# Patient Record
Sex: Male | Born: 1960 | Race: White | Hispanic: No | Marital: Single | Smoking: Never smoker
Health system: Southern US, Community
[De-identification: ages and names within clinical notes are randomized; demographics above are authoritative.]

## PROBLEM LIST (undated history)

## (undated) DIAGNOSIS — F3130 Bipolar disorder, current episode depressed, mild or moderate severity, unspecified: Secondary | ICD-10-CM

## (undated) DIAGNOSIS — R251 Tremor, unspecified: Secondary | ICD-10-CM

## (undated) DIAGNOSIS — F329 Major depressive disorder, single episode, unspecified: Secondary | ICD-10-CM

## (undated) DIAGNOSIS — B20 Human immunodeficiency virus [HIV] disease: Secondary | ICD-10-CM

## (undated) DIAGNOSIS — G251 Drug-induced tremor: Secondary | ICD-10-CM

## (undated) DIAGNOSIS — N50819 Testicular pain, unspecified: Secondary | ICD-10-CM

## (undated) DIAGNOSIS — M1990 Unspecified osteoarthritis, unspecified site: Secondary | ICD-10-CM

## (undated) DIAGNOSIS — I491 Atrial premature depolarization: Secondary | ICD-10-CM

## (undated) DIAGNOSIS — I10 Essential (primary) hypertension: Secondary | ICD-10-CM

## (undated) DIAGNOSIS — D1800 Hemangioma unspecified site: Secondary | ICD-10-CM

## (undated) DIAGNOSIS — F32A Depression, unspecified: Secondary | ICD-10-CM

## (undated) DIAGNOSIS — F419 Anxiety disorder, unspecified: Secondary | ICD-10-CM

## (undated) DIAGNOSIS — R42 Dizziness and giddiness: Secondary | ICD-10-CM

## (undated) HISTORY — DX: Dizziness and giddiness: R42

## (undated) HISTORY — DX: Essential (primary) hypertension: I10

## (undated) HISTORY — DX: Human immunodeficiency virus (HIV) disease: B20

## (undated) HISTORY — DX: Unspecified osteoarthritis, unspecified site: M19.90

## (undated) HISTORY — DX: Testicular pain, unspecified: N50.819

## (undated) HISTORY — DX: Drug-induced tremor: G25.1

## (undated) HISTORY — DX: Anxiety disorder, unspecified: F41.9

## (undated) HISTORY — DX: Atrial premature depolarization: I49.1

## (undated) HISTORY — DX: Major depressive disorder, single episode, unspecified: F32.9

## (undated) HISTORY — DX: Hemangioma unspecified site: D18.00

## (undated) HISTORY — DX: Depression, unspecified: F32.A

## (undated) HISTORY — DX: Tremor, unspecified: R25.1

## (undated) HISTORY — DX: Bipolar disorder, current episode depressed, mild or moderate severity, unspecified: F31.30

---

## 2004-05-14 ENCOUNTER — Encounter (INDEPENDENT_AMBULATORY_CARE_PROVIDER_SITE_OTHER): Payer: Self-pay | Admitting: *Deleted

## 2004-05-14 LAB — CONVERTED CEMR LAB
CD4 Count: 295 microliters
CD4 T Cell Abs: 295

## 2004-08-13 ENCOUNTER — Encounter (INDEPENDENT_AMBULATORY_CARE_PROVIDER_SITE_OTHER): Payer: Self-pay | Admitting: *Deleted

## 2004-08-13 LAB — CONVERTED CEMR LAB: HIV 1 RNA Quant: 714 copies/mL

## 2005-09-13 ENCOUNTER — Encounter (INDEPENDENT_AMBULATORY_CARE_PROVIDER_SITE_OTHER): Payer: Self-pay | Admitting: *Deleted

## 2005-09-13 LAB — CONVERTED CEMR LAB: CD4 Count: 484 microliters

## 2005-11-29 ENCOUNTER — Ambulatory Visit: Payer: Self-pay | Admitting: Infectious Diseases

## 2005-12-14 DIAGNOSIS — B20 Human immunodeficiency virus [HIV] disease: Secondary | ICD-10-CM

## 2006-04-09 ENCOUNTER — Encounter (INDEPENDENT_AMBULATORY_CARE_PROVIDER_SITE_OTHER): Payer: Self-pay | Admitting: *Deleted

## 2006-04-22 ENCOUNTER — Encounter (INDEPENDENT_AMBULATORY_CARE_PROVIDER_SITE_OTHER): Payer: Self-pay | Admitting: *Deleted

## 2006-04-24 ENCOUNTER — Ambulatory Visit: Payer: Self-pay | Admitting: Infectious Diseases

## 2006-04-24 ENCOUNTER — Encounter: Admission: RE | Admit: 2006-04-24 | Discharge: 2006-04-24 | Payer: Self-pay | Admitting: Internal Medicine

## 2006-04-24 LAB — CONVERTED CEMR LAB
AST: 19 units/L (ref 0–37)
Alkaline Phosphatase: 100 units/L (ref 39–117)
BUN: 17 mg/dL (ref 6–23)
Basophils Relative: 0 % (ref 0–1)
Calcium: 9.4 mg/dL (ref 8.4–10.5)
Chloride: 103 meq/L (ref 96–112)
Creatinine, Ser: 1.1 mg/dL (ref 0.40–1.50)
Eosinophils Absolute: 0.1 10*3/uL (ref 0.0–0.7)
Glucose, Bld: 112 mg/dL — ABNORMAL HIGH (ref 70–99)
HCV Ab: NEGATIVE
HDL: 52 mg/dL (ref >39)
HIV 1 RNA Quant: 113 copies/mL — ABNORMAL HIGH (ref <50)
HIV-1 antibody: POSITIVE — AB
HIV-2 Ab: NEGATIVE
HIV: REACTIVE
Hemoglobin, Urine: NEGATIVE
Hemoglobin: 16.9 g/dL (ref 13.0–17.0)
Hepatitis B Surface Ag: NEGATIVE
Ketones, ur: NEGATIVE mg/dL
Leukocytes, UA: NEGATIVE
Lymphs Abs: 2.4 10*3/uL (ref 0.7–3.3)
MCHC: 35.4 g/dL (ref 30.0–36.0)
MCV: 90.5 fL (ref 78.0–100.0)
Monocytes Absolute: 0.4 10*3/uL (ref 0.2–0.7)
Monocytes Relative: 7 % (ref 3–11)
Nitrite: NEGATIVE
Protein, ur: NEGATIVE mg/dL
RBC: 5.27 M/uL (ref 4.22–5.81)
Total CHOL/HDL Ratio: 3.8
Triglycerides: 82 mg/dL (ref <150)
Urobilinogen, UA: 0.2 (ref 0.0–1.0)
pH: 5.5 (ref 5.0–8.0)

## 2006-04-30 ENCOUNTER — Encounter (INDEPENDENT_AMBULATORY_CARE_PROVIDER_SITE_OTHER): Payer: Self-pay | Admitting: Infectious Diseases

## 2006-05-07 ENCOUNTER — Ambulatory Visit: Payer: Self-pay | Admitting: Infectious Diseases

## 2006-06-11 ENCOUNTER — Ambulatory Visit: Payer: Self-pay | Admitting: Infectious Diseases

## 2006-09-17 ENCOUNTER — Encounter: Admission: RE | Admit: 2006-09-17 | Discharge: 2006-09-17 | Payer: Self-pay | Admitting: Infectious Disease

## 2006-09-17 ENCOUNTER — Ambulatory Visit: Payer: Self-pay | Admitting: Infectious Disease

## 2006-09-17 LAB — CONVERTED CEMR LAB
ALT: 17 units/L (ref 0–53)
AST: 16 units/L (ref 0–37)
Basophils Absolute: 0 10*3/uL (ref 0.0–0.1)
Basophils Relative: 0 % (ref 0–1)
CO2: 24 meq/L (ref 19–32)
Calcium: 8.8 mg/dL (ref 8.4–10.5)
Chloride: 106 meq/L (ref 96–112)
Creatinine, Ser: 1.07 mg/dL (ref 0.40–1.50)
HIV 1 RNA Quant: 73 copies/mL — ABNORMAL HIGH (ref <50)
Lymphocytes Relative: 43 % (ref 12–46)
MCHC: 33.9 g/dL (ref 30.0–36.0)
Monocytes Absolute: 0.5 10*3/uL (ref 0.2–0.7)
Neutro Abs: 2.6 10*3/uL (ref 1.7–7.7)
Neutrophils Relative %: 45 % (ref 43–77)
Platelets: 161 10*3/uL (ref 150–400)
RDW: 14.4 % — ABNORMAL HIGH (ref 11.5–14.0)
Sodium: 142 meq/L (ref 135–145)
Total Protein: 7.1 g/dL (ref 6.0–8.3)

## 2006-09-25 ENCOUNTER — Encounter (INDEPENDENT_AMBULATORY_CARE_PROVIDER_SITE_OTHER): Payer: Self-pay | Admitting: *Deleted

## 2006-10-01 ENCOUNTER — Ambulatory Visit (HOSPITAL_COMMUNITY): Admission: RE | Admit: 2006-10-01 | Discharge: 2006-10-01 | Payer: Self-pay | Admitting: Infectious Disease

## 2006-10-01 ENCOUNTER — Ambulatory Visit: Payer: Self-pay | Admitting: Infectious Disease

## 2006-10-01 DIAGNOSIS — M21619 Bunion of unspecified foot: Secondary | ICD-10-CM

## 2006-10-01 DIAGNOSIS — E785 Hyperlipidemia, unspecified: Secondary | ICD-10-CM | POA: Insufficient documentation

## 2006-10-01 DIAGNOSIS — F329 Major depressive disorder, single episode, unspecified: Secondary | ICD-10-CM | POA: Insufficient documentation

## 2006-11-20 ENCOUNTER — Ambulatory Visit: Payer: Self-pay | Admitting: Infectious Disease

## 2006-11-20 ENCOUNTER — Encounter: Admission: RE | Admit: 2006-11-20 | Discharge: 2006-11-20 | Payer: Self-pay | Admitting: Infectious Disease

## 2006-11-20 LAB — CONVERTED CEMR LAB
Albumin: 4.6 g/dL (ref 3.5–5.2)
Alkaline Phosphatase: 96 units/L (ref 39–117)
BUN: 16 mg/dL (ref 6–23)
Calcium: 9.2 mg/dL (ref 8.4–10.5)
Chloride: 106 meq/L (ref 96–112)
Creatinine, Ser: 1.07 mg/dL (ref 0.40–1.50)
Glucose, Bld: 118 mg/dL — ABNORMAL HIGH (ref 70–99)
HDL: 52 mg/dL (ref >39)
Potassium: 4.2 meq/L (ref 3.5–5.3)
Total CHOL/HDL Ratio: 3.2
Triglycerides: 57 mg/dL (ref <150)

## 2006-12-03 ENCOUNTER — Ambulatory Visit: Payer: Self-pay | Admitting: Infectious Disease

## 2006-12-03 LAB — CONVERTED CEMR LAB
Cholesterol: 180 mg/dL (ref 0–200)
LDL Cholesterol: 117 mg/dL — ABNORMAL HIGH (ref 0–99)
Total CHOL/HDL Ratio: 3.8
Triglycerides: 81 mg/dL (ref <150)
VLDL: 16 mg/dL (ref 0–40)

## 2006-12-12 ENCOUNTER — Telehealth: Payer: Self-pay | Admitting: Infectious Disease

## 2006-12-12 ENCOUNTER — Telehealth: Payer: Self-pay

## 2007-02-11 ENCOUNTER — Encounter: Payer: Self-pay | Admitting: Infectious Disease

## 2007-12-06 ENCOUNTER — Ambulatory Visit: Payer: Self-pay | Admitting: Internal Medicine

## 2007-12-06 ENCOUNTER — Ambulatory Visit: Payer: Self-pay | Admitting: Infectious Disease

## 2007-12-06 LAB — CONVERTED CEMR LAB
ALT: 41 units/L (ref 0–53)
AST: 26 units/L (ref 0–37)
Basophils Absolute: 0 10*3/uL (ref 0.0–0.1)
Basophils Relative: 0 % (ref 0–1)
Calcium: 9.1 mg/dL (ref 8.4–10.5)
Chloride: 104 meq/L (ref 96–112)
Creatinine, Ser: 0.96 mg/dL (ref 0.40–1.50)
HIV 1 RNA Quant: <50 copies/mL (ref <50)
MCHC: 35.4 g/dL (ref 30.0–36.0)
Monocytes Absolute: 0.5 10*3/uL (ref 0.1–1.0)
Neutro Abs: 2.3 10*3/uL (ref 1.7–7.7)
Neutrophils Relative %: 44 % (ref 43–77)
Platelets: 170 10*3/uL (ref 150–400)
Potassium: 4.4 meq/L (ref 3.5–5.3)
RDW: 13.3 % (ref 11.5–15.5)
Sodium: 140 meq/L (ref 135–145)
Total CHOL/HDL Ratio: 3.6
Total Protein: 7.3 g/dL (ref 6.0–8.3)

## 2007-12-19 ENCOUNTER — Ambulatory Visit: Payer: Self-pay | Admitting: Infectious Disease

## 2007-12-19 ENCOUNTER — Ambulatory Visit (HOSPITAL_COMMUNITY): Admission: RE | Admit: 2007-12-19 | Discharge: 2007-12-19 | Payer: Self-pay | Admitting: Infectious Disease

## 2007-12-19 DIAGNOSIS — R079 Chest pain, unspecified: Secondary | ICD-10-CM

## 2007-12-19 DIAGNOSIS — R413 Other amnesia: Secondary | ICD-10-CM

## 2007-12-19 LAB — CONVERTED CEMR LAB
ALT: 29 units/L (ref 0–53)
AST: 22 units/L (ref 0–37)
Basophils Absolute: 0 10*3/uL (ref 0.0–0.1)
Basophils Relative: 0 % (ref 0–1)
CK-MB: 1.1 ng/mL (ref 0.3–4.0)
Creatinine, Ser: 0.96 mg/dL (ref 0.40–1.50)
Eosinophils Relative: 1 % (ref 0–5)
HCT: 46.7 % (ref 39.0–52.0)
Hemoglobin: 15.9 g/dL (ref 13.0–17.0)
Hep A Total Ab: POSITIVE — AB
MCHC: 34 g/dL (ref 30.0–36.0)
Monocytes Absolute: 0.4 10*3/uL (ref 0.1–1.0)
Neutro Abs: 2.7 10*3/uL (ref 1.7–7.7)
RDW: 13.8 % (ref 11.5–15.5)
Total Bilirubin: 0.9 mg/dL (ref 0.3–1.2)
Vitamin B-12: 996 pg/mL — ABNORMAL HIGH (ref 211–911)

## 2008-04-15 ENCOUNTER — Encounter (INDEPENDENT_AMBULATORY_CARE_PROVIDER_SITE_OTHER): Payer: Self-pay | Admitting: Licensed Clinical Social Worker

## 2008-04-15 ENCOUNTER — Ambulatory Visit: Payer: Self-pay | Admitting: Infectious Disease

## 2008-04-15 LAB — CONVERTED CEMR LAB
ALT: 25 units/L (ref 0–53)
AST: 18 units/L (ref 0–37)
Basophils Relative: 0 % (ref 0–1)
CO2: 27 meq/L (ref 19–32)
HIV 1 RNA Quant: 140 copies/mL — ABNORMAL HIGH (ref <48)
Hemoglobin: 16.8 g/dL (ref 13.0–17.0)
Lymphs Abs: 2.8 10*3/uL (ref 0.7–4.0)
Monocytes Relative: 9 % (ref 3–12)
Neutro Abs: 3.2 10*3/uL (ref 1.7–7.7)
Neutrophils Relative %: 48 % (ref 43–77)
RBC: 5.23 M/uL (ref 4.22–5.81)
Sodium: 142 meq/L (ref 135–145)
Total Bilirubin: 0.3 mg/dL (ref 0.3–1.2)
Total Protein: 7.2 g/dL (ref 6.0–8.3)

## 2008-04-29 ENCOUNTER — Ambulatory Visit: Payer: Self-pay | Admitting: Infectious Disease

## 2008-04-29 LAB — CONVERTED CEMR LAB
Chlamydia, Swab/Urine, PCR: NEGATIVE
GC Probe Amp, Urine: NEGATIVE
HDL goal, serum: 40 mg/dL

## 2008-11-20 ENCOUNTER — Ambulatory Visit: Payer: Self-pay | Admitting: Infectious Disease

## 2008-11-20 LAB — CONVERTED CEMR LAB
ALT: 25 units/L (ref 0–53)
AST: 20 units/L (ref 0–37)
Albumin: 4.6 g/dL (ref 3.5–5.2)
Alkaline Phosphatase: 101 units/L (ref 39–117)
Basophils Absolute: 0 10*3/uL (ref 0.0–0.1)
Basophils Relative: 0 % (ref 0–1)
Cholesterol: 165 mg/dL (ref 0–200)
Eosinophils Absolute: 0.1 10*3/uL (ref 0.0–0.7)
Glucose, Bld: 94 mg/dL (ref 70–99)
HIV 1 RNA Quant: 97 copies/mL — ABNORMAL HIGH (ref <48)
HIV-1 RNA Quant, Log: 1.99 — ABNORMAL HIGH (ref <1.68)
MCHC: 33.5 g/dL (ref 30.0–36.0)
MCV: 93.1 fL (ref >=78.0)
Neutro Abs: 3.2 10*3/uL (ref 1.7–7.7)
Neutrophils Relative %: 54 % (ref 43–77)
Platelets: 154 10*3/uL (ref 150–400)
Potassium: 4.3 meq/L (ref 3.5–5.3)
RDW: 13.4 % (ref 11.5–15.5)
Sodium: 142 meq/L (ref 135–145)
Total Protein: 6.8 g/dL (ref 6.0–8.3)

## 2008-11-30 ENCOUNTER — Ambulatory Visit: Payer: Self-pay | Admitting: Infectious Disease

## 2008-11-30 DIAGNOSIS — G4700 Insomnia, unspecified: Secondary | ICD-10-CM

## 2008-11-30 DIAGNOSIS — F411 Generalized anxiety disorder: Secondary | ICD-10-CM

## 2009-10-21 ENCOUNTER — Telehealth (INDEPENDENT_AMBULATORY_CARE_PROVIDER_SITE_OTHER): Payer: Self-pay | Admitting: *Deleted

## 2010-01-26 ENCOUNTER — Telehealth (INDEPENDENT_AMBULATORY_CARE_PROVIDER_SITE_OTHER): Payer: Self-pay | Admitting: *Deleted

## 2010-02-03 ENCOUNTER — Ambulatory Visit
Admission: RE | Admit: 2010-02-03 | Discharge: 2010-02-03 | Payer: Self-pay | Source: Home / Self Care | Attending: Orthopedic Surgery | Admitting: Orthopedic Surgery

## 2010-02-03 ENCOUNTER — Ambulatory Visit: Payer: Self-pay | Admitting: Adult Health

## 2010-02-03 DIAGNOSIS — IMO0002 Reserved for concepts with insufficient information to code with codable children: Secondary | ICD-10-CM | POA: Insufficient documentation

## 2010-03-15 ENCOUNTER — Telehealth: Payer: Self-pay | Admitting: Infectious Disease

## 2010-03-15 NOTE — Progress Notes (Signed)
Summary: temporary supply of med called to pharmacy  Phone Note Call from Patient   Caller: Patient Reason for Call: Refill Medication Details for Reason: request a 7-day supply of Atripla Summary of Call: Patient called requesting a 7 day supply of Atripla be sent to [facility] becuase he is awaiting for his mail order to arrive.  He has one tablet left and did not want to run out.  Please call him at [phone] once it has been sent. Initial call taken by: Paulo Fruit  BS,CPht II,MPH,  October 21, 2009 12:04 PM  Follow-up for Phone Call        Patient notified on voicemail as he requested. Follow-up by: Paulo Fruit  BS,CPht II,MPH,  October 21, 2009 12:05 PM    Prescriptions: ATRIPLA 600-200-300 MG TABS (EFAVIRENZ-EMTRICITAB-TENOFOVIR) one tab by mouth once daily  #7 x 0   Entered by:   Paulo Fruit  BS,CPht II,MPH   Authorized by:   Acey Lav MD   Signed by:   Paulo Fruit  BS,CPht II,MPH on 10/21/2009   Method used:   Electronically to        [phone]* (retail)       [address]       [place], [place]  [address]       Ph: [phone] or [phone]       Fax: [phone]   RxID:   [id number]  Paulo Fruit  BS,CPht II,MPH  October 21, 2009 12:05 PM

## 2010-03-17 NOTE — Assessment & Plan Note (Signed)
Summary: per tammy infected bite [mkj]   Visit Type:    Primary Provider:  Paulette Blanch Dam MD  CC:  left hand pain and insect bite last week.  History of Present Illness: Comes to clinic for evaluation of "infected finger" on left hand.  States had "insect bite" on 5th digit of left hand which originally caused hand to swell, but swelling and redness decreased after "a few days".  Noticed at where he had original "bite" wound started draining and oozing.  Staes he cleaned area with q-tip and peroxide, but in the past 48 hours wound became much larger, very painful, and "opened up."  Denies fever, chills, sweats.  States has hx of well-controlled DM, but has not obtained a CBG recently.    Current Allergies (reviewed today): No known allergies  Past History:  Past medical, surgical, family and social histories (including risk factors) reviewed for relevance to current acute and chronic problems.  Past Medical History: Reviewed history from 12/19/2007 and no changes required. HIV disease Diabetes Mellitus? Hyperlipidemia  Past Surgical History: Reviewed history from 11/30/2008 and no changes required. No recent surgery  Family History: Reviewed history from 12/19/2007 and no changes required. no early CAD  Social History: Reviewed history from 12/19/2007 and no changes required. pos MJ, no tobacco, occ etoh. Single not sexually active  Review of Systems General:  Denies chills, fatigue, fever, loss of appetite, malaise, sleep disorder, sweats, weakness, and weight loss. MS:  Complains of joint pain, joint redness, and joint swelling. Derm:  Complains of changes in color of skin and poor wound healing. Neuro:  Denies brief paralysis, difficulty with concentration, disturbances in coordination, falling down, headaches, inability to speak, memory loss, numbness, poor balance, seizures, sensation of room spinning, tingling, tremors, visual disturbances, and weakness. Psych:   Denies alternate hallucination ( auditory/visual), anxiety, depression, easily angered, easily tearful, irritability, mental problems, panic attacks, sense of great danger, suicidal thoughts/plans, thoughts of violence, unusual visions or sounds, and thoughts /plans of harming others. Endo:  Denies cold intolerance, excessive hunger, excessive thirst, excessive urination, heat intolerance, polyuria, and weight change.  Vital Signs:  Patient profile:   50 year old male Height:      69 inches (175.26 cm) Weight:      153.50 pounds (69.77 kg) BMI:     22.75 Temp:     98.3 degrees F oral Pulse rate:   71 / minute BP sitting:   145 / 88  (left arm)  Vitals Entered By: Starleen Arms CMA (February 03, 2010 10:24 AM) CC: left hand pain, insect bite last week Is Patient Diabetic? No Pain Assessment Patient in pain? yes     Location: left finger Intensity: 10 Type: aching Nutritional Status BMI of 19 -24 = normal  Does patient need assistance? Functional Status Self care Ambulation Normal   Physical Exam  General:  Well-developed,well-nourished,in no acute distress; alert,appropriate and cooperative throughout examination Head:  Normocephalic and atraumatic without obvious abnormalities. No apparent alopecia or balding. Mouth:  Oral mucosa and oropharynx without lesions or exudates.  Teeth in good repair. Msk:  decreased ROM, joint tenderness, joint swelling, joint warmth, redness over joint, and enlarged PIP joints.  5th digit left hand with very large ulcerative wound over PIP joint with both ecchymotic and necrotic base, (+) purulent drainage noted.  Diffuse edema to area with generalized erythema to region. Pulses:  R and L carotid,radial,femoral,dorsalis pedis and posterior tibial pulses are full and equal bilaterally Skin:  See MS  Psych:  Cognition and judgment appear intact. Alert and cooperative with normal attention span and concentration. No apparent delusions, illusions,  hallucinations   Impression & Recommendations:  Problem # 1:  CELLULITIS AND ABSCESS OF UNSPECIFIED DIGIT (ICD-681.9) Extensive involvement of pip joint.  Concern over bone involvement is raised.  Presence of necrotic tissue necessitates more thorough evaluation by orthopedics.  A referral was made to Dr. Merlyn Lot who agreed to see him in outpt. surgery center.  Wound was dressed in wet-to-dry fashion with bulk outer dressing.  Patient was instructed to schedule a follow-up with Dr. Daiva Eves after this acute episode is addressed.  He was discharged to the outpatient surgery center. Orders: Orthopedic Surgeon Referral (Ortho Surgeon) Est. Patient Level IV [phone])  Other Orders: Influenza Vaccine NON MCR ([id number])   Immunizations Administered:  Influenza Vaccine # 1:    Vaccine Type: Fluvax Non-MCR    Site: right deltoid    Mfr: novartis    Dose: 0.5 ml    Route: IM    Given by: Starleen Arms CMA    Exp. Date: 05/15/2010    Lot #: [id number]    VIS given: 09/07/09 version given February 03, 2010.  Flu Vaccine Consent Questions:    Do you have a history of severe allergic reactions to this vaccine? no    Any prior history of allergic reactions to egg and/or gelatin? no    Do you have a sensitivity to the preservative Thimersol? no    Do you have a past history of Guillan-Barre Syndrome? no    Do you currently have an acute febrile illness? no    Have you ever had a severe reaction to latex? no    Vaccine information given and explained to patient? yes

## 2010-03-17 NOTE — Progress Notes (Signed)
Summary: pt. will schedule appt. 3/12 due to new job  Phone Note Call from Patient   Caller: Patient Summary of Call: pt. started new job and will not be able to schedule appt. until 3/12 Initial call taken by: Wendall Mola CMA [facility]),  January 26, 2010 12:20 PM

## 2010-03-23 NOTE — Progress Notes (Signed)
Summary: possible MRSA  Phone Note Call from Patient   Caller: Patient Call For: Alexis Welch Dam MD Summary of Call: Patient called stating that he has red spots on chin and upper lip and he is sure that it's MRSA. He wants  to know if DR.VanDam could write him an RX for this. 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  Follow-up by: Acey Lav MD,  March 16, 2010 8:10 AM

## 2010-04-25 LAB — CULTURE, ROUTINE-ABSCESS: Gram Stain: NONE SEEN

## 2010-04-25 LAB — FUNGUS CULTURE W SMEAR

## 2010-04-25 LAB — AFB CULTURE WITH SMEAR (NOT AT ARMC): Acid Fast Smear: NONE SEEN

## 2010-04-25 LAB — POCT HEMOGLOBIN-HEMACUE: Hemoglobin: 16.2 g/dL (ref 13.0–17.0)

## 2010-04-25 LAB — GLUCOSE, CAPILLARY: Glucose-Capillary: 125 mg/dL — ABNORMAL HIGH (ref 70–99)

## 2010-04-25 LAB — ANAEROBIC CULTURE

## 2010-05-19 LAB — T-HELPER CELL (CD4) - (RCID CLINIC ONLY): CD4 % Helper T Cell: 31 % — ABNORMAL LOW (ref 33–55)

## 2010-05-25 ENCOUNTER — Other Ambulatory Visit: Payer: Self-pay | Admitting: *Deleted

## 2010-05-25 DIAGNOSIS — B20 Human immunodeficiency virus [HIV] disease: Secondary | ICD-10-CM

## 2010-05-25 MED ORDER — EFAVIRENZ-EMTRICITAB-TENOFOVIR 600-200-300 MG PO TABS: 1.0000 | ORAL_TABLET | Freq: Every day | ORAL | Status: DC

## 2010-05-26 LAB — T-HELPER CELL (CD4) - (RCID CLINIC ONLY)
CD4 % Helper T Cell: 23 % — ABNORMAL LOW (ref 33–55)
CD4 T Cell Abs: 640 uL (ref 400–2700)

## 2010-06-30 ENCOUNTER — Other Ambulatory Visit (INDEPENDENT_AMBULATORY_CARE_PROVIDER_SITE_OTHER): Payer: Managed Care, Other (non HMO)

## 2010-06-30 ENCOUNTER — Other Ambulatory Visit: Payer: Self-pay | Admitting: Infectious Disease

## 2010-06-30 DIAGNOSIS — Z79899 Other long term (current) drug therapy: Secondary | ICD-10-CM

## 2010-06-30 DIAGNOSIS — Z113 Encounter for screening for infections with a predominantly sexual mode of transmission: Secondary | ICD-10-CM

## 2010-06-30 DIAGNOSIS — B20 Human immunodeficiency virus [HIV] disease: Secondary | ICD-10-CM

## 2010-06-30 LAB — CBC WITH DIFFERENTIAL/PLATELET
Eosinophils Absolute: 0.1 10*3/uL (ref 0.0–0.7)
Hemoglobin: 14.4 g/dL (ref 13.0–17.0)
Lymphocytes Relative: 20 % (ref 12–46)
Lymphs Abs: 1.4 10*3/uL (ref 0.7–4.0)
MCH: 33.2 pg (ref 26.0–34.0)
MCV: 97 fL (ref 78.0–100.0)
Monocytes Relative: 9 % (ref 3–12)
Neutrophils Relative %: 69 % (ref 43–77)
RBC: 4.34 MIL/uL (ref 4.22–5.81)
WBC: 7 10*3/uL (ref 4.0–10.5)

## 2010-06-30 LAB — LIPID PANEL
Cholesterol: 199 mg/dL (ref 0–200)
LDL Cholesterol: 134 mg/dL — ABNORMAL HIGH (ref 0–99)
Total CHOL/HDL Ratio: 4 Ratio
VLDL: 15 mg/dL (ref 0–40)

## 2010-07-01 LAB — COMPLETE METABOLIC PANEL WITH GFR
ALT: 22 U/L (ref 0–53)
Albumin: 4.8 g/dL (ref 3.5–5.2)
CO2: 24 mEq/L (ref 19–32)
Calcium: 9 mg/dL (ref 8.4–10.5)
Chloride: 103 mEq/L (ref 96–112)
GFR, Est African American: >60 mL/min (ref >60)
GFR, Est Non African American: >60 mL/min (ref >60)
Glucose, Bld: 101 mg/dL — ABNORMAL HIGH (ref 70–99)
Sodium: 140 mEq/L (ref 135–145)
Total Bilirubin: 0.6 mg/dL (ref 0.3–1.2)
Total Protein: 7.2 g/dL (ref 6.0–8.3)

## 2010-07-01 LAB — RPR

## 2010-07-01 LAB — T-HELPER CELL (CD4) - (RCID CLINIC ONLY): CD4 T Cell Abs: 480 uL (ref 400–2700)

## 2010-07-14 ENCOUNTER — Telehealth: Payer: Self-pay | Admitting: Licensed Clinical Social Worker

## 2010-07-14 ENCOUNTER — Ambulatory Visit: Payer: Self-pay | Admitting: Infectious Disease

## 2010-07-14 NOTE — Telephone Encounter (Signed)
Patient is complaining of sinus pressure and pain and feels that this is a sinus infection. Dr. Daiva Eves  is not here and NP did not have any available appointments. He wants a prescription sent to the pharmacy.

## 2010-07-27 ENCOUNTER — Encounter: Payer: Self-pay | Admitting: Infectious Disease

## 2010-07-27 ENCOUNTER — Other Ambulatory Visit: Payer: Self-pay | Admitting: *Deleted

## 2010-07-27 ENCOUNTER — Ambulatory Visit (INDEPENDENT_AMBULATORY_CARE_PROVIDER_SITE_OTHER): Payer: Managed Care, Other (non HMO) | Admitting: Infectious Disease

## 2010-07-27 DIAGNOSIS — Z22322 Carrier or suspected carrier of Methicillin resistant Staphylococcus aureus: Secondary | ICD-10-CM

## 2010-07-27 DIAGNOSIS — F320 Major depressive disorder, single episode, mild: Secondary | ICD-10-CM

## 2010-07-27 DIAGNOSIS — Z23 Encounter for immunization: Secondary | ICD-10-CM

## 2010-07-27 DIAGNOSIS — IMO0002 Reserved for concepts with insufficient information to code with codable children: Secondary | ICD-10-CM

## 2010-07-27 DIAGNOSIS — B20 Human immunodeficiency virus [HIV] disease: Secondary | ICD-10-CM

## 2010-07-27 DIAGNOSIS — J309 Allergic rhinitis, unspecified: Secondary | ICD-10-CM | POA: Insufficient documentation

## 2010-07-27 MED ORDER — CHLORHEXIDINE GLUCONATE 4 % EX LIQD: CUTANEOUS | Status: DC

## 2010-07-27 MED ORDER — MUPIROCIN 2 % EX OINT: TOPICAL_OINTMENT | Freq: Two times a day (BID) | CUTANEOUS | Status: AC

## 2010-07-27 MED ORDER — CITALOPRAM HYDROBROMIDE 40 MG PO TABS: 40.0000 mg | ORAL_TABLET | Freq: Every day | ORAL | Status: DC

## 2010-07-27 MED ORDER — CLONAZEPAM 0.5 MG PO TABS: 0.5000 mg | ORAL_TABLET | Freq: Every evening | ORAL | Status: DC | PRN

## 2010-07-27 MED ORDER — MUPIROCIN 2 % EX OINT: TOPICAL_OINTMENT | Freq: Two times a day (BID) | CUTANEOUS | Status: DC

## 2010-07-27 NOTE — Progress Notes (Signed)
Subjective:    Patient ID: Alexis Welch, male    DOB: March 21, 1960, 50 y.o.   MRN: [id number]  HPI  [name] is a 50 year old Caucasian male HIV has been quite well-controlled on Atripla. He does suffer from comorbid depression anxiety and hyperlipidemia. This spring he succumbed to severe vessel was assessed for his finger infection and required extensive surgery and prolonged oral antibiotic with local wound care under direction of Dr. Rica Mast. This has healed up well although he still does have some pain if he inadvertently hits the surgical scar site. [name] also suffered from some allergies a few weeks ago and was concerned about this he seems to seem to improve. He asked about other medications he can take in addition to the the Zyrtec that he took at that time. I advised advised to take Zyrtec more consistently on a chronic basis and we can consider inhaled steroid. He has increased his Celexa to 40 mg daily. His depression has worsened since I saw him 2 years ago. He changed jobs in part due to is having difficulties with his boss and reacting to that the boss becoming angry he says he locked himself in a room for 45 minutes or 1.9 prior to changing jobs. He suffered several losses including one close family member and a close friend who is an inpatient person he knew of the patient's HIV status. His sister who suffers from bipolar disorder currently institutionalized and the patient states that he now has healthcare power of attorney for her. He continues to suffer from the intrusive thoughts sometimes it more so at night he has anxiety as often but easily startled he has problems with insomnia and also excessive sleeping during the day he complains of diaphoresis but seems to accompany what sound like anxiety attacks. He feels overall lack of energy and and malaise. We spent more than an hour talking to [name] including grade present time counseling patient face-to-face and according  his care. He was contract for safety and denied active or passive suicidal ideation. We discussed the need for him to be seen by a psychiatrist I made a referral to psychiatry. We did contemplate changing his Atripla to either complera  or twice daily isentress and once daily truvada  encase the Atripla iscontributing to his psychiatric illness.  Review of Systems  Constitutional: Positive for diaphoresis, activity change, appetite change and fatigue. Negative for fever and chills.  HENT: Negative for ear pain, facial swelling, neck pain, neck stiffness and tinnitus.   Eyes: Negative for pain, discharge, redness, itching and visual disturbance.  Respiratory: Negative for apnea, cough, choking, chest tightness, shortness of breath and wheezing.   Cardiovascular: Positive for palpitations. Negative for chest pain and leg swelling.  Gastrointestinal: Negative for abdominal pain, constipation, blood in stool and abdominal distention.  Genitourinary: Negative for dysuria and difficulty urinating.  Musculoskeletal: Positive for myalgias and arthralgias. Negative for back pain and gait problem.  Skin: Negative for color change, pallor and rash.  Neurological: Positive for dizziness, weakness, light-headedness and headaches. Negative for tremors, seizures, syncope, facial asymmetry and speech difficulty.  Hematological: Negative for adenopathy. Does not bruise/bleed easily.  Psychiatric/Behavioral: Positive for behavioral problems, sleep disturbance, dysphoric mood and decreased concentration. Negative for suicidal ideas and self-injury. The patient is nervous/anxious.        Objective:   Physical Exam  Constitutional: He is oriented to person, place, and time. He appears well-developed and well-nourished. No distress.  HENT:  Head: Normocephalic and  atraumatic.  Mouth/Throat: Oropharynx is clear and moist. No oropharyngeal exudate.  Eyes: EOM are normal. Pupils are equal, round, and reactive to  light. Right eye exhibits no discharge. Left eye exhibits no discharge. No scleral icterus.  Neck: No JVD present. No tracheal deviation present. No thyromegaly present.  Cardiovascular: Normal rate and regular rhythm.  Exam reveals no gallop and no friction rub.   No murmur heard. Pulmonary/Chest: Effort normal and breath sounds normal. No respiratory distress. He has no wheezes. He has no rales.  Abdominal: He exhibits no distension. There is no rebound and no guarding.  Musculoskeletal: He exhibits no edema and no tenderness.  Neurological: He is alert and oriented to person, place, and time. No cranial nerve deficit. Coordination normal.  Skin: No rash noted. He is not diaphoretic. No pallor.  Psychiatric: His mood appears anxious. His affect is not angry, not blunt, not labile and not inappropriate. His speech is not rapid and/or pressured, not delayed and not tangential. He is not agitated, not aggressive, is not hyperactive, not slowed and not withdrawn. Thought content is not paranoid and not delusional. Cognition and memory are not impaired. He does not express impulsivity or inappropriate judgment. He exhibits a depressed mood. He expresses no homicidal and no suicidal ideation. He expresses no suicidal plans and no homicidal plans. He exhibits abnormal remote memory. He exhibits normal recent memory. He is attentive.          Assessment & Plan:  HIV DISEASE Continue Atripla for now but will consider changing him to complera vs isentress and Truvada   MRSA (methicillin resistant staph aureus) culture positive Will have him perform decontamination regimen  CELLULITIS AND ABSCESS OF UNSPECIFIED DIGIT This has resolved with aggressive surgery by Dr. Merlyn Lot and antibiotics  Major depression He may have components of obsessive compulsive disorder given the intrusive thoughts . He has problems with anxiety as well. For now will continue him on his Celexa. I do think he needs extra help  from a psychiatrist to help with a pharmacotherapy and with cognitive behavioral therapy. I have also given him a prescription for clonazepam to take at bedtime.  Allergic rhinitis Continue his histamine blocker because of the an inhaled steroid if needed

## 2010-07-27 NOTE — Assessment & Plan Note (Signed)
Will have him perform decontamination regimen

## 2010-07-27 NOTE — Assessment & Plan Note (Signed)
Continue his histamine blocker because of the an inhaled steroid if needed

## 2010-07-27 NOTE — Assessment & Plan Note (Signed)
Continue Atripla for now but will consider changing him to complera vs isentress and Truvada

## 2010-07-27 NOTE — Assessment & Plan Note (Signed)
This has resolved with aggressive surgery by Dr. Merlyn Lot and antibiotics

## 2010-07-27 NOTE — Patient Instructions (Signed)
You need pneumonia shot today I would like you to see a psychiatrist rtc in one month to see Dr Daiva Eves and then in the fall agan for labs and visit

## 2010-07-27 NOTE — Assessment & Plan Note (Signed)
He may have components of obsessive compulsive disorder given the intrusive thoughts . He has problems with anxiety as well. For now will continue him on his Celexa. I do think he needs extra help from a psychiatrist to help with a pharmacotherapy and with cognitive behavioral therapy. I have also given him a prescription for clonazepam to take at bedtime.

## 2010-08-01 ENCOUNTER — Telehealth: Payer: Self-pay | Admitting: *Deleted

## 2010-08-01 ENCOUNTER — Ambulatory Visit: Payer: Self-pay | Admitting: Infectious Disease

## 2010-08-01 ENCOUNTER — Other Ambulatory Visit: Payer: Self-pay | Admitting: *Deleted

## 2010-08-01 DIAGNOSIS — F320 Major depressive disorder, single episode, mild: Secondary | ICD-10-CM

## 2010-08-01 MED ORDER — CITALOPRAM HYDROBROMIDE 40 MG PO TABS: 40.0000 mg | ORAL_TABLET | Freq: Every day | ORAL | Status: DC

## 2010-08-01 MED ORDER — CLONAZEPAM 0.5 MG PO TABS: 0.5000 mg | ORAL_TABLET | Freq: Every evening | ORAL | Status: DC | PRN

## 2010-08-01 NOTE — Telephone Encounter (Signed)
Patient called about referral to psychiatry.  Patient given the number for [facility] [phone].  They require patient to call themselves to set up appointment. Wendall Mola CMA

## 2010-08-08 ENCOUNTER — Other Ambulatory Visit: Payer: Self-pay | Admitting: *Deleted

## 2010-08-09 ENCOUNTER — Other Ambulatory Visit: Payer: Self-pay | Admitting: *Deleted

## 2010-08-09 DIAGNOSIS — F320 Major depressive disorder, single episode, mild: Secondary | ICD-10-CM

## 2010-08-29 ENCOUNTER — Encounter: Payer: Self-pay | Admitting: Infectious Disease

## 2010-08-29 ENCOUNTER — Ambulatory Visit (INDEPENDENT_AMBULATORY_CARE_PROVIDER_SITE_OTHER): Payer: Managed Care, Other (non HMO) | Admitting: Infectious Disease

## 2010-08-29 VITALS — BP 124/84 | HR 66 | Temp 98.2°F | Wt 149.8 lb

## 2010-08-29 DIAGNOSIS — F329 Major depressive disorder, single episode, unspecified: Secondary | ICD-10-CM

## 2010-08-29 DIAGNOSIS — B20 Human immunodeficiency virus [HIV] disease: Secondary | ICD-10-CM

## 2010-08-29 DIAGNOSIS — R5383 Other fatigue: Secondary | ICD-10-CM | POA: Insufficient documentation

## 2010-08-29 DIAGNOSIS — R5381 Other malaise: Secondary | ICD-10-CM

## 2010-08-29 NOTE — Assessment & Plan Note (Signed)
Check TSH again likely due to depression but we'll check this thyroid function

## 2010-08-29 NOTE — Assessment & Plan Note (Signed)
Continue Atripla. I don't think is playing a large role in his current depressive mood.

## 2010-08-29 NOTE — Progress Notes (Signed)
Subjective:    Patient ID: Alexis Welch, male    DOB: 1960/09/05, 50 y.o.   MRN: [id number]  HPI  Mr. Liborio Nixon is a 50 year old Caucasian male HIV has been quite well-controlled on Atripla. He does suffer from comorbid depression anxiety and hyperlipidemia. This spring he succumbed to severe vessel was assessed for his finger infection and required extensive surgery and prolonged oral antibiotic with local wound care under direction of Dr. Rica Mast. This has healed up well although he still does have some pain if he inadvertently hits the surgical scar site. Mr. Vandiver also suffered from some allergies a few weeks ago and was concerned about this he seems to seem to improve. He asked about other medications he can take in addition to the the Zyrtec that he took at that time. I advised advised to take Zyrtec more consistently on a chronic basis and we can consider inhaled steroid. He has increased his Celexa to 40 mg daily.He changed jobs in part due to is having difficulties with his boss and reacting to that the boss becoming angry he says he locked himself in a room for 45 minutes or 1.9 prior to changing jobs. He suffered several losses including one close family member and a close friend who is an inpatient person he knew of the patient's HIV status. His sister who suffers from bipolar disorder currently institutionalized and the patient states that he now has healthcare power of attorney for her.  Since I last saw him his mood has generally improved and his anxiety is substantially better while taking the klonopin. He has still intrusive thoughts at nigbht and has comtemplated taking an extra dose of klonopin for this which I have considered. He is more depressed today due in part he believes to the weather. He denies any active or passive suicidal ideation and has contracted for safety. We spent more than 45 minutes with Mr. Couper including greater than 50% of time in counseling patient  face-to-face and according his care. Review of Systems  Constitutional: Negative for fever, chills, diaphoresis, activity change, appetite change, fatigue and unexpected weight change.  HENT: Negative for congestion, sore throat, rhinorrhea, sneezing, trouble swallowing and sinus pressure.   Eyes: Negative for photophobia and visual disturbance.  Respiratory: Negative for cough, chest tightness, shortness of breath, wheezing and stridor.   Cardiovascular: Negative for chest pain, palpitations and leg swelling.  Gastrointestinal: Negative for nausea, vomiting, abdominal pain, diarrhea, constipation, blood in stool, abdominal distention and anal bleeding.  Genitourinary: Negative for dysuria, hematuria, flank pain and difficulty urinating.  Musculoskeletal: Negative for myalgias, back pain, joint swelling, arthralgias and gait problem.  Skin: Negative for color change, pallor, rash and wound.  Neurological: Negative for dizziness, tremors, weakness and light-headedness.  Hematological: Negative for adenopathy. Does not bruise/bleed easily.  Psychiatric/Behavioral: Positive for sleep disturbance, dysphoric mood and decreased concentration. Negative for suicidal ideas, hallucinations, behavioral problems, confusion and agitation. The patient is nervous/anxious. The patient is not hyperactive.        Objective:   Physical Exam  Constitutional: He is oriented to person, place, and time. He appears well-developed and well-nourished. No distress.  HENT:  Head: Normocephalic and atraumatic.  Mouth/Throat: Oropharynx is clear and moist. No oropharyngeal exudate.  Eyes: Conjunctivae and EOM are normal. Pupils are equal, round, and reactive to light. No scleral icterus.  Neck: Normal range of motion. Neck supple. No JVD present.  Cardiovascular: Normal rate, regular rhythm and normal heart sounds.  Exam reveals no gallop and  no friction rub.   No murmur heard. Pulmonary/Chest: Effort normal and breath  sounds normal. No respiratory distress. He has no wheezes. He has no rales. He exhibits no tenderness.  Abdominal: He exhibits no distension and no mass. There is no tenderness. There is no rebound and no guarding.  Musculoskeletal: He exhibits no edema and no tenderness.  Lymphadenopathy:    He has no cervical adenopathy.  Neurological: He is alert and oriented to person, place, and time. He has normal reflexes. He exhibits normal muscle tone. Coordination normal.  Skin: Skin is warm and dry. He is not diaphoretic. No erythema. No pallor.  Psychiatric: His mood appears not anxious. His affect is not angry, not blunt, not labile and not inappropriate. His speech is not rapid and/or pressured, not delayed, not tangential and not slurred. He is withdrawn. He is not agitated, not aggressive, is not hyperactive, not slowed, not actively hallucinating and not combative. Thought content is not paranoid and not delusional. Cognition and memory are not impaired. He does not express impulsivity or inappropriate judgment. He exhibits a depressed mood. He expresses no homicidal and no suicidal ideation. He expresses no suicidal plans and no homicidal plans. He is communicative. He exhibits normal recent memory and normal remote memory. He is attentive.          Assessment & Plan:  HIV DISEASE Continue Atripla. I don't think is playing a large role in his current depressive mood.  Major depression Continue Klonopin and Celexa. He may need a higher dose of Klonopin if he needs to take more than one pill on some days in the month.  Fatigue Check TSH again likely due to depression but we'll check this thyroid function

## 2010-08-29 NOTE — Assessment & Plan Note (Signed)
Continue Klonopin and Celexa. He may need a higher dose of Klonopin if he needs to take more than one pill on some days in the month.

## 2010-08-31 ENCOUNTER — Ambulatory Visit: Payer: Managed Care, Other (non HMO) | Admitting: Infectious Disease

## 2010-11-14 LAB — T-HELPER CELL (CD4) - (RCID CLINIC ONLY)
CD4 % Helper T Cell: 29 — ABNORMAL LOW
CD4 T Cell Abs: 660

## 2010-11-28 LAB — T-HELPER CELL (CD4) - (RCID CLINIC ONLY)
CD4 % Helper T Cell: 21 — ABNORMAL LOW
CD4 T Cell Abs: 500

## 2010-12-14 ENCOUNTER — Ambulatory Visit (INDEPENDENT_AMBULATORY_CARE_PROVIDER_SITE_OTHER): Payer: Managed Care, Other (non HMO) | Admitting: Licensed Clinical Social Worker

## 2010-12-14 ENCOUNTER — Telehealth: Payer: Self-pay | Admitting: Licensed Clinical Social Worker

## 2010-12-14 DIAGNOSIS — Z23 Encounter for immunization: Secondary | ICD-10-CM

## 2010-12-14 DIAGNOSIS — R110 Nausea: Secondary | ICD-10-CM

## 2010-12-14 MED ORDER — PROMETHAZINE HCL 25 MG PO TABS: 25.0000 mg | ORAL_TABLET | Freq: Four times a day (QID) | ORAL | Status: AC | PRN

## 2010-12-14 NOTE — Telephone Encounter (Signed)
Patient walked in today stating he had a stomach virus that started off with Vomiting and Diarrhea on day 1 and then just nausea and vomiting on for the next 2 days. Patient is feeling better today but is feeling nausea throughout the day. Would like something for nausea.Checked with Dr. Ninetta Lights and he is prescribing Phenergan 25 mg every 6 hours prn nausea

## 2010-12-28 ENCOUNTER — Other Ambulatory Visit: Payer: Self-pay | Admitting: Infectious Disease

## 2010-12-28 ENCOUNTER — Other Ambulatory Visit (INDEPENDENT_AMBULATORY_CARE_PROVIDER_SITE_OTHER): Payer: Managed Care, Other (non HMO)

## 2010-12-28 DIAGNOSIS — Z79899 Other long term (current) drug therapy: Secondary | ICD-10-CM

## 2010-12-28 DIAGNOSIS — B20 Human immunodeficiency virus [HIV] disease: Secondary | ICD-10-CM

## 2010-12-28 DIAGNOSIS — Z113 Encounter for screening for infections with a predominantly sexual mode of transmission: Secondary | ICD-10-CM

## 2010-12-28 LAB — RPR

## 2010-12-28 LAB — CBC WITH DIFFERENTIAL/PLATELET
HCT: 40.1 % (ref 39.0–52.0)
Hemoglobin: 13.6 g/dL (ref 13.0–17.0)
Lymphs Abs: 1.8 10*3/uL (ref 0.7–4.0)
Monocytes Absolute: 0.4 10*3/uL (ref 0.1–1.0)
Monocytes Relative: 8 % (ref 3–12)
Neutro Abs: 2.7 10*3/uL (ref 1.7–7.7)
Neutrophils Relative %: 55 % (ref 43–77)
RBC: 4.41 MIL/uL (ref 4.22–5.81)

## 2010-12-28 LAB — COMPLETE METABOLIC PANEL WITH GFR
Albumin: 4.1 g/dL (ref 3.5–5.2)
BUN: 14 mg/dL (ref 6–23)
CO2: 28 mEq/L (ref 19–32)
Calcium: 8.8 mg/dL (ref 8.4–10.5)
Chloride: 102 mEq/L (ref 96–112)
GFR, Est African American: >89 mL/min/{1.73_m2}
GFR, Est Non African American: >89 mL/min/{1.73_m2}
Glucose, Bld: 84 mg/dL (ref 70–99)
Potassium: 4 mEq/L (ref 3.5–5.3)
Sodium: 141 mEq/L (ref 135–145)
Total Protein: 6.6 g/dL (ref 6.0–8.3)

## 2010-12-28 LAB — LIPID PANEL
Cholesterol: 202 mg/dL — ABNORMAL HIGH (ref 0–200)
HDL: 55 mg/dL (ref >39)
Total CHOL/HDL Ratio: 3.7 Ratio
Triglycerides: 60 mg/dL (ref <150)

## 2010-12-29 LAB — T-HELPER CELL (CD4) - (RCID CLINIC ONLY)
CD4 % Helper T Cell: 32 % — ABNORMAL LOW (ref 33–55)
CD4 T Cell Abs: 610 uL (ref 400–2700)

## 2011-01-03 LAB — HIV-1 GENOTYPR PLUS

## 2011-01-11 ENCOUNTER — Ambulatory Visit (INDEPENDENT_AMBULATORY_CARE_PROVIDER_SITE_OTHER): Payer: Managed Care, Other (non HMO) | Admitting: Infectious Disease

## 2011-01-11 ENCOUNTER — Encounter: Payer: Self-pay | Admitting: Infectious Disease

## 2011-01-11 ENCOUNTER — Other Ambulatory Visit: Payer: Self-pay | Admitting: Licensed Clinical Social Worker

## 2011-01-11 VITALS — BP 144/88 | HR 70 | Resp 20 | Ht 69.0 in | Wt 144.3 lb

## 2011-01-11 DIAGNOSIS — F411 Generalized anxiety disorder: Secondary | ICD-10-CM

## 2011-01-11 DIAGNOSIS — R1013 Epigastric pain: Secondary | ICD-10-CM

## 2011-01-11 DIAGNOSIS — F329 Major depressive disorder, single episode, unspecified: Secondary | ICD-10-CM

## 2011-01-11 DIAGNOSIS — B20 Human immunodeficiency virus [HIV] disease: Secondary | ICD-10-CM

## 2011-01-11 MED ORDER — CLONAZEPAM 1 MG PO TABS: 1.0000 mg | ORAL_TABLET | Freq: Every evening | ORAL | Status: DC | PRN

## 2011-01-11 NOTE — Assessment & Plan Note (Signed)
Increase klonopin to 1mg  qhs

## 2011-01-11 NOTE — Progress Notes (Signed)
  Subjective:    Patient ID: Alexis Welch, male    DOB: 1960/11/01, 50 y.o.   MRN: [id number]  HPI  Mr. Liborio Nixon is a 50 year old Caucasian male HIV has been quite well-controlled on Atripla. He does suffer from comorbid depression anxiety and hyperlipidemia  He continues to suffer from anxiety and is in need of higher dose of klonopin. He is taking celexa. He denies si or hi. He is contracted for safety  Review of Systems  Constitutional: Negative for fever, chills, diaphoresis, activity change, appetite change, fatigue and unexpected weight change.  HENT: Negative for congestion, sore throat, rhinorrhea, sneezing, trouble swallowing and sinus pressure.   Eyes: Negative for photophobia and visual disturbance.  Respiratory: Negative for cough, chest tightness, shortness of breath, wheezing and stridor.   Cardiovascular: Negative for chest pain, palpitations and leg swelling.  Gastrointestinal: Negative for nausea, vomiting, abdominal pain, diarrhea, constipation, blood in stool, abdominal distention and anal bleeding.  Genitourinary: Negative for dysuria, hematuria, flank pain and difficulty urinating.  Musculoskeletal: Negative for myalgias, back pain, joint swelling, arthralgias and gait problem.  Skin: Negative for color change, pallor, rash and wound.  Neurological: Negative for dizziness, tremors, weakness and light-headedness.  Hematological: Negative for adenopathy. Does not bruise/bleed easily.  Psychiatric/Behavioral: Negative for behavioral problems, confusion, sleep disturbance, dysphoric mood, decreased concentration and agitation. The patient is nervous/anxious.        Objective:   Physical Exam  Constitutional: He is oriented to person, place, and time. He appears well-developed and well-nourished. No distress.  HENT:  Head: Normocephalic and atraumatic.  Mouth/Throat: Oropharynx is clear and moist. No oropharyngeal exudate.  Eyes: Conjunctivae and EOM are normal.  Pupils are equal, round, and reactive to light. No scleral icterus.  Neck: Normal range of motion. Neck supple. No JVD present.  Cardiovascular: Normal rate, regular rhythm and normal heart sounds.  Exam reveals no gallop and no friction rub.   No murmur heard. Pulmonary/Chest: Effort normal and breath sounds normal. No respiratory distress. He has no wheezes. He has no rales. He exhibits no tenderness.  Abdominal: He exhibits no distension and no mass. There is no tenderness. There is no rebound and no guarding.  Musculoskeletal: He exhibits no edema and no tenderness.  Lymphadenopathy:    He has no cervical adenopathy.  Neurological: He is alert and oriented to person, place, and time. He has normal reflexes. He exhibits normal muscle tone. Coordination normal.  Skin: Skin is warm and dry. He is not diaphoretic. No erythema. No pallor.  Psychiatric: His behavior is normal. Judgment and thought content normal. His mood appears anxious.          Assessment & Plan:  HIV DISEASE Continue atripla  Major depression Increase klonopin to 1mg  qhs

## 2011-01-11 NOTE — Assessment & Plan Note (Signed)
Continue atripla 

## 2011-01-12 MED ORDER — CLONAZEPAM 1 MG PO TABS: 1.0000 mg | ORAL_TABLET | Freq: Every evening | ORAL | Status: DC | PRN

## 2011-04-28 ENCOUNTER — Other Ambulatory Visit: Payer: Self-pay | Admitting: *Deleted

## 2011-04-28 DIAGNOSIS — Z7721 Contact with and (suspected) exposure to potentially hazardous body fluids: Secondary | ICD-10-CM

## 2011-05-10 ENCOUNTER — Other Ambulatory Visit: Payer: Managed Care, Other (non HMO)

## 2011-05-10 DIAGNOSIS — B20 Human immunodeficiency virus [HIV] disease: Secondary | ICD-10-CM

## 2011-05-10 LAB — COMPLETE METABOLIC PANEL WITH GFR
ALT: 15 U/L (ref 0–53)
AST: 17 U/L (ref 0–37)
Albumin: 4.7 g/dL (ref 3.5–5.2)
Alkaline Phosphatase: 151 U/L — ABNORMAL HIGH (ref 39–117)
Glucose, Bld: 85 mg/dL (ref 70–99)
Potassium: 4 mEq/L (ref 3.5–5.3)
Sodium: 142 mEq/L (ref 135–145)
Total Protein: 7.3 g/dL (ref 6.0–8.3)

## 2011-05-10 LAB — CBC WITH DIFFERENTIAL/PLATELET
Eosinophils Absolute: 0 10*3/uL (ref 0.0–0.7)
Eosinophils Relative: 1 % (ref 0–5)
HCT: 46.9 % (ref 39.0–52.0)
Hemoglobin: 15.6 g/dL (ref 13.0–17.0)
Lymphs Abs: 2.1 10*3/uL (ref 0.7–4.0)
MCH: 31 pg (ref 26.0–34.0)
MCV: 93.2 fL (ref 78.0–100.0)
Monocytes Relative: 5 % (ref 3–12)
RBC: 5.03 MIL/uL (ref 4.22–5.81)

## 2011-05-24 ENCOUNTER — Ambulatory Visit: Payer: Managed Care, Other (non HMO) | Admitting: Infectious Disease

## 2011-05-25 ENCOUNTER — Other Ambulatory Visit: Payer: Self-pay | Admitting: *Deleted

## 2011-05-25 DIAGNOSIS — F411 Generalized anxiety disorder: Secondary | ICD-10-CM

## 2011-05-25 DIAGNOSIS — B20 Human immunodeficiency virus [HIV] disease: Secondary | ICD-10-CM

## 2011-05-25 MED ORDER — EFAVIRENZ-EMTRICITAB-TENOFOVIR 600-200-300 MG PO TABS: 1.0000 | ORAL_TABLET | Freq: Every day | ORAL | Status: DC

## 2011-05-25 MED ORDER — CLONAZEPAM 1 MG PO TABS: 1.0000 mg | ORAL_TABLET | Freq: Every evening | ORAL | Status: DC | PRN

## 2011-05-25 NOTE — Telephone Encounter (Signed)
I called [facility] & they said I could E rx the clonazepam

## 2011-06-08 ENCOUNTER — Other Ambulatory Visit: Payer: Self-pay | Admitting: Licensed Clinical Social Worker

## 2011-06-08 DIAGNOSIS — F411 Generalized anxiety disorder: Secondary | ICD-10-CM

## 2011-06-08 MED ORDER — CLONAZEPAM 1 MG PO TABS: 1.0000 mg | ORAL_TABLET | Freq: Every evening | ORAL | Status: DC | PRN

## 2011-07-04 ENCOUNTER — Telehealth: Payer: Self-pay | Admitting: *Deleted

## 2011-07-04 NOTE — Telephone Encounter (Signed)
Called patient and left voice mail to remind him of appt with Dr. Daiva Eves for tomorrow. Wendall Mola CMA

## 2011-07-05 ENCOUNTER — Encounter: Payer: Self-pay | Admitting: Infectious Disease

## 2011-07-05 ENCOUNTER — Ambulatory Visit (INDEPENDENT_AMBULATORY_CARE_PROVIDER_SITE_OTHER): Payer: Managed Care, Other (non HMO) | Admitting: Infectious Disease

## 2011-07-05 VITALS — BP 148/91 | HR 71 | Temp 98.1°F | Ht 69.0 in | Wt 144.5 lb

## 2011-07-05 DIAGNOSIS — F329 Major depressive disorder, single episode, unspecified: Secondary | ICD-10-CM

## 2011-07-05 DIAGNOSIS — F320 Major depressive disorder, single episode, mild: Secondary | ICD-10-CM

## 2011-07-05 DIAGNOSIS — B20 Human immunodeficiency virus [HIV] disease: Secondary | ICD-10-CM

## 2011-07-05 DIAGNOSIS — R45851 Suicidal ideations: Secondary | ICD-10-CM | POA: Insufficient documentation

## 2011-07-05 MED ORDER — CITALOPRAM HYDROBROMIDE 40 MG PO TABS: 40.0000 mg | ORAL_TABLET | Freq: Every day | ORAL | Status: DC

## 2011-07-05 MED ORDER — CITALOPRAM HYDROBROMIDE 40 MG PO TABS
40.0000 mg | ORAL_TABLET | Freq: Every day | ORAL | Status: DC
Start: 2011-07-05 — End: 2011-08-23

## 2011-07-05 NOTE — Assessment & Plan Note (Signed)
See above

## 2011-07-05 NOTE — Assessment & Plan Note (Signed)
Perfect control. COnsidered changing to complera or stribild given MH problems but pt would prefer stay on atripla for now

## 2011-07-05 NOTE — Patient Instructions (Signed)
YOU NEED TO BE SEEN BY A PSYCHIATRIST WITHIN THE WEEK  I WOULD LIKE YOU TO MEET WITH ONE OF OUR COUNSELORS IF POSSIBLE TODAY  RTC TO SEE ME IN July=-OK TO OVERBOOK if necessary

## 2011-07-05 NOTE — Assessment & Plan Note (Signed)
Contracted for safety. Restart celexa. Continue klonopin. URgent referral to psychiatry.

## 2011-07-05 NOTE — Progress Notes (Signed)
Subjective:    Patient ID: Alexis Welch, male    DOB: Jul 28, 1960, 51 y.o.   MRN: [id number]  HPI  51  Year old man with HIV that is perfectly well controlled but who suffers from SEVERE depression. Much of depression is centered around his work where his schedule is taxing and sometimes deprives him of sleep. He has at times taken up to 2mg of his clonazepam to calm down at night. He has also fallen asleep upon returning home from work and on one occasion in his car due to exhaustion. He has had passive SI and now active thoughts of slitting his wrists though not today. He is contracted for safety. He has not been taking his celexa x 6 months. We will restart that and I am ADAMANT that he see a psychatrist. He does not want admission to [facility] today and he promises to contract for safety. I spent greater than 60 minutes with the patient including greater than 50% of time in face to face counsel of the patient and in coordination of their care.    Review of Systems  Constitutional: Negative for fever, chills, diaphoresis, activity change, appetite change, fatigue and unexpected weight change.  HENT: Negative for congestion, sore throat, rhinorrhea, sneezing, trouble swallowing and sinus pressure.   Eyes: Negative for photophobia and visual disturbance.  Respiratory: Negative for cough, chest tightness, shortness of breath, wheezing and stridor.   Cardiovascular: Negative for chest pain, palpitations and leg swelling.  Gastrointestinal: Negative for nausea, vomiting, abdominal pain, diarrhea, constipation, blood in stool, abdominal distention and anal bleeding.  Genitourinary: Negative for dysuria, hematuria, flank pain and difficulty urinating.  Musculoskeletal: Negative for myalgias, back pain, joint swelling, arthralgias and gait problem.  Skin: Negative for color change, pallor, rash and wound.  Neurological: Negative for dizziness, tremors, weakness and light-headedness.  Hematological:  Negative for adenopathy. Does not bruise/bleed easily.  Psychiatric/Behavioral: Positive for suicidal ideas, sleep disturbance, dysphoric mood and decreased concentration. Negative for behavioral problems, confusion and agitation. The patient is nervous/anxious.        Objective:   Physical Exam  Constitutional: He is oriented to person, place, and time. He appears well-developed and well-nourished. No distress.  HENT:  Head: Normocephalic and atraumatic.  Mouth/Throat: Oropharynx is clear and moist. No oropharyngeal exudate.  Eyes: Conjunctivae and EOM are normal. Pupils are equal, round, and reactive to light. No scleral icterus.  Neck: Normal range of motion. Neck supple. No JVD present.  Cardiovascular: Normal rate, regular rhythm and normal heart sounds.  Exam reveals no gallop and no friction rub.   No murmur heard. Pulmonary/Chest: Effort normal and breath sounds normal. No respiratory distress. He has no wheezes. He has no rales. He exhibits no tenderness.  Abdominal: He exhibits no distension and no mass. There is no tenderness. There is no rebound and no guarding.  Musculoskeletal: He exhibits no edema and no tenderness.  Lymphadenopathy:    He has no cervical adenopathy.  Neurological: He is alert and oriented to person, place, and time. He has normal reflexes. He exhibits normal muscle tone. Coordination normal.  Skin: Skin is warm and dry. He is not diaphoretic. No erythema. No pallor.  Psychiatric: His behavior is normal. Judgment normal. His mood appears anxious. He exhibits a depressed mood. He expresses suicidal ideation. He expresses no homicidal ideation.          Assessment & Plan:  Suicidal ideation Contracted for safety. Restart celexa. Continue klonopin. URgent referral to psychiatry.   Major  depression See above  HIV DISEASE Perfect control. COnsidered changing to complera or stribild given MH problems but pt would prefer stay on atripla for now

## 2011-07-14 ENCOUNTER — Telehealth: Payer: Self-pay | Admitting: *Deleted

## 2011-07-14 NOTE — Telephone Encounter (Signed)
States he has called [facility] , a therapist, but has had to leave messages. He wants to be seen Tuesday after 11am. I told him we had a new therapist coming, Franne Forts. He is also available Tues or Wed in the early am. I left a messahe for Alisa on her cell asking ow I could reach Kennt to make an appt. Meanwhile I gave him the number for the former [place] [facility], now [place], [place]. I urged him to call them as they respond  7 days a week, 24 hours a day. He took the number. I will let him know when I hear back from [place] or Bernette Redbird about him being seen here

## 2011-07-17 NOTE — Telephone Encounter (Signed)
I checked on him to make sure Alexis Welch did contact someone for psychological counselling. Alexis Welch will be seen tomorrow at 1pm at [facility]. Alexis Welch asked that when the new person-[name] Shore-starts seeing pts here if Alexis Welch could change to him. I told him that would be fine. Ask at his appt if they will make sure that happens.  Mr. Shana Chute starts seeing clients 07/24/11

## 2011-08-23 ENCOUNTER — Encounter: Payer: Self-pay | Admitting: Licensed Clinical Social Worker

## 2011-08-23 ENCOUNTER — Ambulatory Visit (INDEPENDENT_AMBULATORY_CARE_PROVIDER_SITE_OTHER): Payer: Managed Care, Other (non HMO) | Admitting: Infectious Disease

## 2011-08-23 ENCOUNTER — Encounter: Payer: Self-pay | Admitting: Infectious Disease

## 2011-08-23 VITALS — BP 145/95 | HR 62 | Temp 98.1°F | Wt 141.0 lb

## 2011-08-23 DIAGNOSIS — E785 Hyperlipidemia, unspecified: Secondary | ICD-10-CM

## 2011-08-23 DIAGNOSIS — F329 Major depressive disorder, single episode, unspecified: Secondary | ICD-10-CM

## 2011-08-23 DIAGNOSIS — B20 Human immunodeficiency virus [HIV] disease: Secondary | ICD-10-CM

## 2011-08-23 DIAGNOSIS — F411 Generalized anxiety disorder: Secondary | ICD-10-CM

## 2011-08-23 DIAGNOSIS — R1013 Epigastric pain: Secondary | ICD-10-CM

## 2011-08-23 LAB — CBC WITH DIFFERENTIAL/PLATELET
Basophils Absolute: 0 10*3/uL (ref 0.0–0.1)
Basophils Relative: 1 % (ref 0–1)
HCT: 41.9 % (ref 39.0–52.0)
Hemoglobin: 14.9 g/dL (ref 13.0–17.0)
Lymphocytes Relative: 32 % (ref 12–46)
MCHC: 35.6 g/dL (ref 30.0–36.0)
Monocytes Absolute: 0.5 10*3/uL (ref 0.1–1.0)
Monocytes Relative: 10 % (ref 3–12)
Neutro Abs: 3 10*3/uL (ref 1.7–7.7)
Neutrophils Relative %: 56 % (ref 43–77)
WBC: 5.2 10*3/uL (ref 4.0–10.5)

## 2011-08-23 LAB — LIPID PANEL
Cholesterol: 179 mg/dL (ref 0–200)
LDL Cholesterol: 118 mg/dL — ABNORMAL HIGH (ref 0–99)
Total CHOL/HDL Ratio: 3.4 Ratio
Triglycerides: 43 mg/dL (ref <150)
VLDL: 9 mg/dL (ref 0–40)

## 2011-08-23 MED ORDER — CLONAZEPAM 1 MG PO TABS: 1.0000 mg | ORAL_TABLET | Freq: Every evening | ORAL | Status: DC | PRN

## 2011-08-23 NOTE — Assessment & Plan Note (Signed)
On Celexa and clonazepam seeing a Veterinary surgeon. I am skeptical that is going to continue to be of function is currently.given his situational anxiety and depression. I would not be surprised if he requires time away from his work a more protracted way to improve

## 2011-08-23 NOTE — Assessment & Plan Note (Signed)
Recheck labs today consider change to Stribild in future.

## 2011-08-23 NOTE — Progress Notes (Signed)
Subjective:    Patient ID: Alexis Welch, male    DOB: 1960-10-02, 51 y.o.   MRN: [id number]  HPI  51 Year old man with HIV that is perfectly well controlled but who suffers from SEVERE depression. I saw him in late May at which point time is having some passive thoughts of suicide but no active suicidal ideation. He continues to suffer from significant depression and stress and anxiety related largely to his work and his employer with him he has poor interpersonal relations. He is currently seeing one of our counselors [facility]. He is on Celexa 40 mg daily and is on Klonopin which is helping a great deal with his anxiety. We have contemplated changing them Atripla 2 different antiretroviral regimen in case the central nervous system acting effects of Atripla might be playing a role. He has been on this medicine for many years however.I spent greater than 60 minutes with the patient including greater than 50% of time in face to face counsel of the patient and in coordination of their care. Sent to follow very closely with a counselor here and he contracted for safety with me.   Review of Systems  Constitutional: Negative for fever, chills, diaphoresis, activity change, appetite change, fatigue and unexpected weight change.  HENT: Negative for congestion, sore throat, rhinorrhea, sneezing, trouble swallowing and sinus pressure.   Eyes: Negative for photophobia and visual disturbance.  Respiratory: Negative for cough, chest tightness, shortness of breath, wheezing and stridor.   Cardiovascular: Negative for chest pain, palpitations and leg swelling.  Gastrointestinal: Negative for nausea, vomiting, abdominal pain, diarrhea, constipation, blood in stool, abdominal distention and anal bleeding.  Genitourinary: Negative for dysuria, hematuria, flank pain and difficulty urinating.  Musculoskeletal: Negative for myalgias, back pain, joint swelling, arthralgias  and gait problem.  Skin: Negative for color change, pallor, rash and wound.  Neurological: Negative for dizziness, tremors, weakness and light-headedness.  Hematological: Negative for adenopathy. Does not bruise/bleed easily.  Psychiatric/Behavioral: Positive for suicidal ideas, behavioral problems, dysphoric mood and decreased concentration. Negative for confusion, disturbed wake/sleep cycle, self-injury and agitation. The patient is nervous/anxious.        Objective:   Physical Exam  Constitutional: He is oriented to person, place, and time. He appears well-developed and well-nourished. No distress.  HENT:  Head: Normocephalic and atraumatic.  Mouth/Throat: Oropharynx is clear and moist. No oropharyngeal exudate.  Eyes: Conjunctivae and EOM are normal. Pupils are equal, round, and reactive to light. No scleral icterus.  Neck: Normal range of motion. Neck supple. No JVD present.  Cardiovascular: Normal rate, regular rhythm and normal heart sounds.  Exam reveals no gallop and no friction rub.   No murmur heard. Pulmonary/Chest: Effort normal and breath sounds normal. No respiratory distress. He has no wheezes. He has no rales. He exhibits no tenderness.  Abdominal: He exhibits no distension and no mass. There is no tenderness. There is no rebound and no guarding.  Musculoskeletal: He exhibits no edema and no tenderness.  Lymphadenopathy:    He has no cervical adenopathy.  Neurological: He is alert and oriented to person, place, and time. He has normal reflexes. He exhibits normal muscle tone. Coordination normal.  Skin: Skin is warm and dry. He is not diaphoretic. No erythema. No pallor.  Psychiatric: Judgment normal. He is withdrawn. He exhibits a depressed mood. He expresses no suicidal ideation. He expresses no suicidal plans and no homicidal plans.       Tearful,  Assessment & Plan:  HIV DISEASE Recheck labs today consider change to Stribild in future.  Abdominal  pain, epigastric This along with his loose stools seem largely relateed to anxiety  Major depression On Celexa and clonazepam seeing a counselor. I am skeptical that is going to continue to be of function is currently.given his situational anxiety and depression. I would not be surprised if he requires time away from his work a more protracted way to improve

## 2011-08-23 NOTE — Assessment & Plan Note (Signed)
This along with his loose stools seem largely relateed to anxiety

## 2011-08-24 LAB — COMPLETE METABOLIC PANEL WITH GFR
ALT: 17 U/L (ref 0–53)
AST: 18 U/L (ref 0–37)
Chloride: 107 mEq/L (ref 96–112)
Creat: 0.9 mg/dL (ref 0.50–1.35)
Sodium: 140 mEq/L (ref 135–145)
Total Bilirubin: 0.5 mg/dL (ref 0.3–1.2)
Total Protein: 6.6 g/dL (ref 6.0–8.3)

## 2011-08-24 LAB — T-HELPER CELL (CD4) - (RCID CLINIC ONLY): CD4 T Cell Abs: 540 uL (ref 400–2700)

## 2011-08-25 LAB — HIV-1 RNA QUANT-NO REFLEX-BLD
HIV 1 RNA Quant: <20 copies/mL (ref <20)
HIV-1 RNA Quant, Log: <1.3 {Log} (ref <1.30)

## 2011-08-28 ENCOUNTER — Ambulatory Visit: Payer: Managed Care, Other (non HMO)

## 2011-08-28 DIAGNOSIS — F331 Major depressive disorder, recurrent, moderate: Secondary | ICD-10-CM

## 2011-08-29 NOTE — Progress Notes (Signed)
I met with Alexis Welch again today (I have seen him at [facility], but this was the first time seeing him here at [facility]).  He talked at length about his situation with work and home and the difficult decisions he must make regarding those things.  He said he has talked with Dr. Daiva Eves about going on Long Term Disability and moving to a house in [place] that is owned by family.  This is all due to the intense pressure he feels at work and the physical exhaustion he feels when he goes home.  He reports upset stomach at work, causing him to go to the bathroom every 45 minutes some days.  In addition, when he gets home, he said his legs are numb.  When I brought up the notion of him missing his work he said "no, I don't think I will", but added that he will miss his customers.  He has worked in [employer] here in [place] for many years and, according to him, has developed a large following of loyal customers.  He really feels like going on disability will be the answer to his current problem, but he is plagued with a strong sense of guilt about it all.  He is planning this whole scenario without discussing it with his supervisor (for good reason, it appears), but feels like he is being sneaky or deceptive.  I pointed out that it is okay to take care of yourself and do what you have to do.  At times he seems overwhelmed and he makes vague suicidal comments, but follows up with assurances that he would never act on these thoughts.  Plan to meet again in one week.

## 2011-08-30 ENCOUNTER — Other Ambulatory Visit: Payer: Self-pay | Admitting: Infectious Disease

## 2011-08-30 ENCOUNTER — Other Ambulatory Visit: Payer: Self-pay | Admitting: *Deleted

## 2011-08-30 ENCOUNTER — Other Ambulatory Visit: Payer: Self-pay | Admitting: Licensed Clinical Social Worker

## 2011-08-30 DIAGNOSIS — B20 Human immunodeficiency virus [HIV] disease: Secondary | ICD-10-CM

## 2011-08-30 DIAGNOSIS — F320 Major depressive disorder, single episode, mild: Secondary | ICD-10-CM

## 2011-08-30 DIAGNOSIS — F329 Major depressive disorder, single episode, unspecified: Secondary | ICD-10-CM

## 2011-08-30 DIAGNOSIS — F411 Generalized anxiety disorder: Secondary | ICD-10-CM

## 2011-08-30 DIAGNOSIS — E785 Hyperlipidemia, unspecified: Secondary | ICD-10-CM

## 2011-08-30 DIAGNOSIS — R1013 Epigastric pain: Secondary | ICD-10-CM

## 2011-08-30 MED ORDER — EFAVIRENZ-EMTRICITAB-TENOFOVIR 600-200-300 MG PO TABS: 1.0000 | ORAL_TABLET | Freq: Every day | ORAL | Status: DC

## 2011-08-30 MED ORDER — CITALOPRAM HYDROBROMIDE 40 MG PO TABS: 40.0000 mg | ORAL_TABLET | Freq: Every day | ORAL | Status: DC

## 2011-08-31 ENCOUNTER — Other Ambulatory Visit: Payer: Self-pay | Admitting: *Deleted

## 2011-08-31 DIAGNOSIS — E785 Hyperlipidemia, unspecified: Secondary | ICD-10-CM

## 2011-08-31 DIAGNOSIS — F411 Generalized anxiety disorder: Secondary | ICD-10-CM

## 2011-08-31 DIAGNOSIS — R1013 Epigastric pain: Secondary | ICD-10-CM

## 2011-08-31 DIAGNOSIS — B20 Human immunodeficiency virus [HIV] disease: Secondary | ICD-10-CM

## 2011-08-31 DIAGNOSIS — F329 Major depressive disorder, single episode, unspecified: Secondary | ICD-10-CM

## 2011-08-31 MED ORDER — CLONAZEPAM 1 MG PO TABS: 1.0000 mg | ORAL_TABLET | Freq: Every evening | ORAL | Status: DC | PRN

## 2011-08-31 MED ORDER — EFAVIRENZ-EMTRICITAB-TENOFOVIR 600-200-300 MG PO TABS: 1.0000 | ORAL_TABLET | Freq: Every day | ORAL | Status: DC

## 2011-09-11 ENCOUNTER — Ambulatory Visit: Payer: Managed Care, Other (non HMO)

## 2011-09-11 DIAGNOSIS — F331 Major depressive disorder, recurrent, moderate: Secondary | ICD-10-CM

## 2011-09-11 NOTE — Progress Notes (Signed)
I met with Alexis Welch today and he talked a while about his desire to go on disability from work.  I tried to facilitate a couple of guided imagery exercises involving putting away stressors (the "container" exercise) and imagining a peaceful place ("safe place" exercise).  He had an unusual reaction to these, as he complained about an intense headache.  Later, I was talking about EMDR and his head began to hurt again.  It is my opinion that this is trauma-related.  [name] has reported that he keeps secrets - no one in his life knows he is gay, no one knows he has HIV.  He avoids relationships because of the HIV as well.  He keeps all of these things to himself.  He agreed to consider some EMDR in future sessions.  Plan to meet again in 2 weeks.

## 2011-09-18 ENCOUNTER — Telehealth: Payer: Self-pay | Admitting: Licensed Clinical Social Worker

## 2011-09-18 NOTE — Telephone Encounter (Signed)
Patient states that [employer] will be faxing forms for disability from work, beginning 09/23/2012 and lasting for 6 months. Patient states that the form will be faxed from [employer] in the next 24 to 48 hours.

## 2011-09-20 ENCOUNTER — Telehealth: Payer: Self-pay | Admitting: *Deleted

## 2011-09-20 NOTE — Telephone Encounter (Signed)
I did not type out a separate statemetn but can still do so

## 2011-09-20 NOTE — Telephone Encounter (Signed)
RN spoke with pt.  Needs 23-month disability paperwork completed today due to start date of 09/24/11 through 03/21/11.  Pt also needs a statement from Dr. Daiva Eves for his employer stating the start and end dates for the 61-month period.  Pt faxed letter from his employer with an accompanying return fax number for his employer.  Paperwork from [employer] was received this week.  Letter from employer attached to [employer] paperwork and given to T. [place] to give to Dr. Daiva Eves this morning to be completed today.

## 2011-09-20 NOTE — Telephone Encounter (Signed)
I filled out forms and gave them to [facility]

## 2011-09-21 NOTE — Telephone Encounter (Signed)
Faxed papers to both [employer] and employer. Patient has a copy as well.

## 2011-09-25 NOTE — Telephone Encounter (Signed)
Does he have everything he needs?

## 2011-09-26 ENCOUNTER — Ambulatory Visit: Payer: Managed Care, Other (non HMO)

## 2011-09-26 NOTE — Progress Notes (Unsigned)
I met with Alexis Welch again today and he was in somewhat better spirits, likely due to finally getting started with his temporary disability.  He continues to report pain in his stomach and also at times in his chest.  This is likely anxiety produced and he acknowledged that as he talked about it, it seemed to go away some.  He admits to spending much of his life "stuffing" emotions and not thinking about them.  He has kept his HIV status a secret from everyone in his life except one friend, who died a few years ago.  I provided psycho-education on mindfulness and mindfulness meditation and facilitated a guided meditation.  We also reviewed diaphragmatic breathing and I encouraged him to continue to practice this daily.  We also discussed the possibility of utilizing hypnotherapy in future sessions.  Plan to meet again in one week.

## 2011-10-02 ENCOUNTER — Ambulatory Visit: Payer: Managed Care, Other (non HMO) | Admitting: Internal Medicine

## 2011-10-02 DIAGNOSIS — Z0289 Encounter for other administrative examinations: Secondary | ICD-10-CM

## 2011-10-03 ENCOUNTER — Ambulatory Visit: Payer: Managed Care, Other (non HMO)

## 2011-10-03 DIAGNOSIS — F411 Generalized anxiety disorder: Secondary | ICD-10-CM

## 2011-10-03 DIAGNOSIS — F331 Major depressive disorder, recurrent, moderate: Secondary | ICD-10-CM

## 2011-10-03 NOTE — Progress Notes (Signed)
Alexis Welch reports that since stopping work he has had more thoughts in his head and that sometimes they are "racing thoughts".  He said he enjoys doing yard work and that this helps him forget about everything.  However, when his insurance company calls him, he feels a knot in his stomach and feels awful.  He continues to endorse suicidal ideation, but with no current plan or intent.  He shared a story of getting together with a childhood friend who started cussing and becoming very negative.  Alexis Welch said he had to stop the car and tell her that he is "that close" to suicide (holding his fingers close together) and he could not tolerate her negativity.  He said she got very quiet after that.  He is very worried about his house and wonders if he can get it ready for sale or go into foreclosure.  He says that he doesn't talk about any of these concerns with anyone and always feels relieved after his sessions here because he has gotten them off his chest.  Plan to meet again in one week.

## 2011-10-10 ENCOUNTER — Ambulatory Visit: Payer: Managed Care, Other (non HMO)

## 2011-10-10 DIAGNOSIS — F411 Generalized anxiety disorder: Secondary | ICD-10-CM

## 2011-10-10 NOTE — Progress Notes (Unsigned)
Alexis Welch came in today, reporting some improvement with physical complaints and anxiety.  He noted however, that all it takes is one small trigger and his stomach starts churning.  I provided further psycho-education on EMDR and discussed the possibility of doing this in a future session, but reassured him that it is not something we have to do.  I also assessed him for OCD and he does endorse a history of OCD symptoms, requiring things to be in a certain order, etc.  But he insists that he is not like that any more.  We also discussed his longstanding tendency to keep emotional matters inside rather than share them with anyone.  I explored with him the possibility that some of his somatic symptoms may be connected to this.  He ended by saying that he feels it is helpful to come and talk out some of his persoanl issues.  Plan to meet in one week.

## 2011-10-19 ENCOUNTER — Ambulatory Visit: Payer: Managed Care, Other (non HMO)

## 2011-10-19 DIAGNOSIS — F331 Major depressive disorder, recurrent, moderate: Secondary | ICD-10-CM

## 2011-10-19 DIAGNOSIS — F411 Generalized anxiety disorder: Secondary | ICD-10-CM

## 2011-10-26 ENCOUNTER — Ambulatory Visit: Payer: Managed Care, Other (non HMO)

## 2011-10-26 DIAGNOSIS — F411 Generalized anxiety disorder: Secondary | ICD-10-CM

## 2011-10-26 DIAGNOSIS — F331 Major depressive disorder, recurrent, moderate: Secondary | ICD-10-CM
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
## 2011-10-26 NOTE — Progress Notes (Signed)
I met with Tramayne again today and he reported that he is having daily anxiety and stomach problems, related to his fear that his 6 month disability will not be approved.  He says he doesn't feel he can return to work, but worries that he may have to.  Then he shared that he went to the beach at the suggestion of a friend who has a room on the 13th floor of a building.  He said he started having thoughts of jumping off the balcony, visualizing himself going through the air and where he would land.  This scared him and he left and came back home after only a couple of days being there.  The thoughts went away as soon as he left the beach, he reported.  He assured me that he is not going to hurt himself, but it bothered him that he would have such a graphic thought.  We discussed setting up an appointment at [facility] [place] to see the new Nurse Practitioner for a psychiatric evaluation and medication management.  Plan to meet again in one week.

## 2011-10-31 ENCOUNTER — Ambulatory Visit: Payer: Managed Care, Other (non HMO)

## 2011-10-31 ENCOUNTER — Encounter: Payer: Self-pay | Admitting: Infectious Disease

## 2011-10-31 ENCOUNTER — Ambulatory Visit (INDEPENDENT_AMBULATORY_CARE_PROVIDER_SITE_OTHER): Payer: Managed Care, Other (non HMO) | Admitting: Infectious Disease

## 2011-10-31 VITALS — BP 146/88 | HR 61 | Temp 98.4°F | Wt 140.0 lb

## 2011-10-31 DIAGNOSIS — B20 Human immunodeficiency virus [HIV] disease: Secondary | ICD-10-CM

## 2011-10-31 DIAGNOSIS — F329 Major depressive disorder, single episode, unspecified: Secondary | ICD-10-CM

## 2011-10-31 DIAGNOSIS — F331 Major depressive disorder, recurrent, moderate: Secondary | ICD-10-CM

## 2011-10-31 DIAGNOSIS — F411 Generalized anxiety disorder: Secondary | ICD-10-CM

## 2011-10-31 DIAGNOSIS — R197 Diarrhea, unspecified: Secondary | ICD-10-CM | POA: Insufficient documentation

## 2011-10-31 DIAGNOSIS — Z23 Encounter for immunization: Secondary | ICD-10-CM

## 2011-10-31 NOTE — Progress Notes (Signed)
Alexis Welch came in today again with a notebook detailing some of his thoughts and activities over the past week.  He continues to report poor sleep (5-6 hours per night), daily anxiety, depressed mood, and upset stomach.  He also states that he has been easily distracted lately, losing his train of thought at times.  He denies any suicidal thoughts this week.  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  However, they do not seem to interfere with his functioning, so I am not giving him this diagnosis at this time.  He was tearful as he talked about how much he has done for his sister and that he feels it is time he took better care of himself.  He agreed to talk with his doctor here Daiva Eves) about increasing his Celexa, since it will be several weeks before he can see the psychiatric nurse practitioner at [facility].  Plan to meet again in one week.

## 2011-10-31 NOTE — Assessment & Plan Note (Signed)
Highly reluctant likely to be related to his anxiety depression but will check a tissue transglutaminase IgA per request of the patient. To screen for celiac disease.

## 2011-10-31 NOTE — Progress Notes (Signed)
Subjective:    Patient ID: Alexis Welch, male    DOB: 05-08-1960, 51 y.o.   MRN: [id number]  HPI  51 Year old man with HIV that is perfectly well controlled but who suffers from SEVERE depression. I saw him in late May at which point time is having some passive thoughts of suicide but no active suicidal ideation and have been following him closely with tenderness sure from [facility] services at [place]. He continues to suffer from significant depression. Is not yet been evaluated by psychiatrist and is on maximal dose of 40 milligrams of citalopram. He is also an anxiolytic therapy with a benzodiazepine. He came to clinic today so that I could help him with filling out his FMLA forms and also to affirm that he should continue to not be back at his place of employment until his current depression can be properly treated. I spent greater than 45 minutes with the patient including greater than 50% of time in face to face counsel of the patient and in coordination of their care including writing letter and filling out FMLA forms.    Review of Systems  Constitutional: Negative for fever, chills, diaphoresis, activity change, appetite change, fatigue and unexpected weight change.  HENT: Negative for congestion, sore throat, rhinorrhea, sneezing, trouble swallowing and sinus pressure.   Eyes: Negative for photophobia and visual disturbance.  Respiratory: Negative for cough, chest tightness, shortness of breath, wheezing and stridor.   Cardiovascular: Negative for chest pain, palpitations and leg swelling.  Gastrointestinal: Negative for nausea, vomiting, abdominal pain, diarrhea, constipation, blood in stool, abdominal distention and anal bleeding.  Genitourinary: Negative for dysuria, hematuria, flank pain and difficulty urinating.  Musculoskeletal: Negative for myalgias, back pain, joint swelling, arthralgias and gait problem.  Skin: Negative for color change, pallor, rash and wound.    Neurological: Negative for dizziness, tremors, weakness and light-headedness.  Hematological: Negative for adenopathy. Does not bruise/bleed easily.  Psychiatric/Behavioral: Positive for disturbed wake/sleep cycle, dysphoric mood and decreased concentration. Negative for suicidal ideas, behavioral problems, confusion, self-injury and agitation. The patient is nervous/anxious.        Objective:   Physical Exam  Constitutional: He is oriented to person, place, and time. He appears well-developed and well-nourished.  HENT:  Head: Normocephalic and atraumatic.  Mouth/Throat: Oropharynx is clear and moist.  Eyes: Conjunctivae normal and EOM are normal. Pupils are equal, round, and reactive to light.  Neck: Normal range of motion. Neck supple.  Cardiovascular: Normal rate, regular rhythm and normal heart sounds.   Pulmonary/Chest: Effort normal and breath sounds normal.  Abdominal: He exhibits no distension. There is no tenderness.  Musculoskeletal: He exhibits no edema and no tenderness.  Neurological: He is alert and oriented to person, place, and time. He has normal reflexes. He exhibits normal muscle tone. Coordination normal.  Skin: Skin is warm and dry. He is not diaphoretic. No erythema. No pallor.  Psychiatric: His behavior is normal. Judgment and thought content normal. His mood appears anxious. He exhibits a depressed mood.          Assessment & Plan:  HIV DISEASE Well controlled but given the severity of his depression I think we should give much more serious thought to get him off of Atripla, tensely substituting it with STRIBILD or Tivicay and truvada  Major depression Continue Celexa. He needs to be seen by psychiatrists see above discussion regarding Atripla. He is currently unable to work given the severity of his depressive and symptoms and anxiety.  Diarrhea  Highly reluctant likely to be related to his anxiety depression but will check a tissue transglutaminase IgA  per request of the patient. To screen for celiac disease.

## 2011-10-31 NOTE — Assessment & Plan Note (Signed)
Continue Celexa. He needs to be seen by psychiatrists see above discussion regarding Atripla. He is currently unable to work given the severity of his depressive and symptoms and anxiety.

## 2011-10-31 NOTE — Assessment & Plan Note (Signed)
Well controlled but given the severity of his depression I think we should give much more serious thought to get him off of Atripla, tensely substituting it with STRIBILD or Tivicay and truvada

## 2011-11-01 LAB — LIPID PANEL
Cholesterol: 162 mg/dL (ref 0–200)
HDL: 52 mg/dL (ref >39)
LDL Cholesterol: 93 mg/dL (ref 0–99)
Triglycerides: 86 mg/dL (ref <150)
VLDL: 17 mg/dL (ref 0–40)

## 2011-11-01 LAB — CBC WITH DIFFERENTIAL/PLATELET
Basophils Absolute: 0 10*3/uL (ref 0.0–0.1)
Basophils Relative: 0 % (ref 0–1)
Eosinophils Absolute: 0.1 10*3/uL (ref 0.0–0.7)
HCT: 41 % (ref 39.0–52.0)
Lymphocytes Relative: 40 % (ref 12–46)
MCV: 94 fL (ref 78.0–100.0)
Monocytes Relative: 8 % (ref 3–12)
RBC: 4.36 MIL/uL (ref 4.22–5.81)
WBC: 7 10*3/uL (ref 4.0–10.5)

## 2011-11-01 LAB — COMPLETE METABOLIC PANEL WITH GFR
ALT: 16 U/L (ref 0–53)
AST: 17 U/L (ref 0–37)
Alkaline Phosphatase: 123 U/L — ABNORMAL HIGH (ref 39–117)
BUN: 12 mg/dL (ref 6–23)
Calcium: 9.5 mg/dL (ref 8.4–10.5)
Chloride: 104 mEq/L (ref 96–112)
Creat: 1.05 mg/dL (ref 0.50–1.35)
Total Bilirubin: 0.7 mg/dL (ref 0.3–1.2)

## 2011-11-02 LAB — HIV-1 RNA QUANT-NO REFLEX-BLD
HIV 1 RNA Quant: <20 copies/mL (ref <20)
HIV-1 RNA Quant, Log: <1.3 {Log} (ref <1.30)

## 2011-11-02 LAB — T-HELPER CELL (CD4) - (RCID CLINIC ONLY): CD4 % Helper T Cell: 32 % — ABNORMAL LOW (ref 33–55)

## 2011-11-07 ENCOUNTER — Ambulatory Visit: Payer: Managed Care, Other (non HMO)

## 2011-11-07 DIAGNOSIS — F331 Major depressive disorder, recurrent, moderate: Secondary | ICD-10-CM

## 2011-11-07 DIAGNOSIS — F411 Generalized anxiety disorder: Secondary | ICD-10-CM

## 2011-11-07 NOTE — Progress Notes (Signed)
Alexis Welch came in today and reported that he continues to have anxiety every day, worrying about finances, his sister, etc.  He said his legs feel numb from his hips to his knees.  He also said that sometimes his feet get very cold and numb.  He struggles with concentration, most likely a symptom of his depression, and exhibited it during the session when I began to talk.  He began looking at my mouth as I talked and later said he feels that he has to try to read lips or he will miss something.  He said it is like there is a 3 second delay in his ability to process what is being said.  This has happened in prior sessions, whenever I talk for more than just a moment and it appears to be related to the depression.  I reviewed some breathing techniques and encouraged him to practice them when he is relaxed and not to wait until he is feeling anxious.  Also provided some psycho-education on EMDR and let him hold the paddles that produce "bilateral stimulation" for EMDR.  I reassured him that we would not do this treatment just yet, but I did want him to see the paddles.  Plan to meet again in one week.

## 2011-11-14 ENCOUNTER — Ambulatory Visit: Payer: Managed Care, Other (non HMO)

## 2011-11-14 DIAGNOSIS — F411 Generalized anxiety disorder: Secondary | ICD-10-CM

## 2011-11-14 DIAGNOSIS — F331 Major depressive disorder, recurrent, moderate: Secondary | ICD-10-CM
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
## 2011-11-21 ENCOUNTER — Ambulatory Visit: Payer: Managed Care, Other (non HMO)

## 2011-11-22 ENCOUNTER — Ambulatory Visit (INDEPENDENT_AMBULATORY_CARE_PROVIDER_SITE_OTHER): Payer: Managed Care, Other (non HMO) | Admitting: Infectious Disease

## 2011-11-22 VITALS — BP 142/83 | HR 50 | Temp 97.5°F | Wt 142.5 lb

## 2011-11-22 DIAGNOSIS — B20 Human immunodeficiency virus [HIV] disease: Secondary | ICD-10-CM

## 2011-11-22 DIAGNOSIS — F329 Major depressive disorder, single episode, unspecified: Secondary | ICD-10-CM

## 2011-11-22 DIAGNOSIS — R413 Other amnesia: Secondary | ICD-10-CM

## 2011-11-22 MED ORDER — EMTRICITABINE-TENOFOVIR DF 200-300 MG PO TABS: 1.0000 | ORAL_TABLET | Freq: Every day | ORAL | Status: DC

## 2011-11-22 MED ORDER — DOLUTEGRAVIR SODIUM 50 MG PO TABS: 50.0000 mg | ORAL_TABLET | Freq: Every day | ORAL | Status: DC

## 2011-11-22 NOTE — Assessment & Plan Note (Signed)
Change to TIVICAY and TRUVADA. MINIMAL DRUG DRUG INTERACTIONS--BUT ITS LEVELS ARE LOWERED BY TRILEPTAL AND A FEW OTHER ANTI-SEIZURE MEDS. SO WE WILL STOP THE TRILEPTAL

## 2011-11-22 NOTE — Assessment & Plan Note (Signed)
ON SSRI, DO NOT GIVE HIM TRILEPTAL OR OTHER DRUGS THAT LOWER SERUM CONCENTRATIONS OF HIS ARVS

## 2011-11-22 NOTE — Progress Notes (Signed)
  Subjective:    Patient ID: Alexis Welch, male    DOB: 12-24-1960, 51 y.o.   MRN: [id number]  HPI  51 Year old man with HIV that is perfectly well controlled but who suffers from SEVERE depression. Seen a physician assistant in psychiatry who has prescribed him Trileptal.  UNFORTUNATELY THE TRILEPTAL DOES HAVE SIGNIFICANT DRUG DRUG INTERACTION WITH HIS ATRIPLA AND WOULD LOWER LEVELS OF EFAVIRENZ. He informs me he has only been taking this med since Friday. I am having him stop it now since new regimen I am changing him to ALSO HAS DRUG DRUG INTERACTION WITH TRILEPTAL.  Wife he does need to be in much better spirits today when I last saw him.   Review of Systems  Constitutional: Negative for fever, chills, diaphoresis, activity change, appetite change, fatigue and unexpected weight change.  HENT: Negative for congestion, sore throat, rhinorrhea, sneezing, trouble swallowing and sinus pressure.   Eyes: Negative for photophobia and visual disturbance.  Respiratory: Negative for cough, chest tightness, shortness of breath, wheezing and stridor.   Cardiovascular: Negative for chest pain, palpitations and leg swelling.  Gastrointestinal: Negative for nausea, vomiting, abdominal pain, diarrhea, constipation, blood in stool, abdominal distention and anal bleeding.  Genitourinary: Negative for dysuria, hematuria, flank pain and difficulty urinating.  Musculoskeletal: Negative for myalgias, back pain, joint swelling, arthralgias and gait problem.  Skin: Negative for color change, pallor, rash and wound.  Neurological: Negative for dizziness, tremors, weakness and light-headedness.  Hematological: Negative for adenopathy. Does not bruise/bleed easily.  Psychiatric/Behavioral: Positive for dysphoric mood. Negative for behavioral problems, confusion, disturbed wake/sleep cycle, decreased concentration and agitation.       Objective:   Physical Exam  Constitutional: He is oriented to person,  place, and time. He appears well-developed and well-nourished. No distress.  HENT:  Head: Normocephalic and atraumatic.  Mouth/Throat: Oropharynx is clear and moist. No oropharyngeal exudate.  Eyes: Conjunctivae normal and EOM are normal. Pupils are equal, round, and reactive to light. No scleral icterus.  Neck: Normal range of motion. Neck supple. No JVD present.  Cardiovascular: Normal rate, regular rhythm and normal heart sounds.  Exam reveals no gallop and no friction rub.   No murmur heard. Pulmonary/Chest: Effort normal and breath sounds normal. No respiratory distress. He has no wheezes. He has no rales. He exhibits no tenderness.  Abdominal: He exhibits no distension and no mass. There is no tenderness. There is no rebound and no guarding.  Musculoskeletal: He exhibits no edema and no tenderness.  Lymphadenopathy:    He has no cervical adenopathy.  Neurological: He is alert and oriented to person, place, and time. He has normal reflexes. He exhibits normal muscle tone. Coordination normal.  Skin: Skin is warm and dry. He is not diaphoretic. No erythema. No pallor.  Psychiatric: Judgment and thought content normal.          Assessment & Plan:  HIV DISEASE Change to TIVICAY and TRUVADA. MINIMAL DRUG DRUG INTERACTIONS--BUT ITS LEVELS ARE LOWERED BY TRILEPTAL AND A FEW OTHER ANTI-SEIZURE MEDS. SO WE WILL STOP THE TRILEPTAL  MEMORY LOSS HOPING CHANGE OF ATRIPLA MAY HAVE POSITIVE EFFECT HERE AS WELL  Major depression ON SSRI, DO NOT GIVE HIM TRILEPTAL OR OTHER DRUGS THAT LOWER SERUM CONCENTRATIONS OF HIS ARVS

## 2011-11-22 NOTE — Assessment & Plan Note (Signed)
HOPING CHANGE OF ATRIPLA MAY HAVE POSITIVE EFFECT HERE AS WELL

## 2011-11-23 ENCOUNTER — Ambulatory Visit: Payer: Managed Care, Other (non HMO)

## 2011-11-23 DIAGNOSIS — F411 Generalized anxiety disorder: Secondary | ICD-10-CM

## 2011-11-23 DIAGNOSIS — F331 Major depressive disorder, recurrent, moderate: Secondary | ICD-10-CM

## 2011-11-23 NOTE — Progress Notes (Signed)
I met with Alexis Welch again today and he reported that the nurse practitioner at [facility] prescribed him a "seizure drug".  I explained that most of the seizure medications are also mood stabilizers and that this likely means that she felt he has Bipolar Disorder (she was rushed and spent only 20 minutes with him and focused on his family history of this disorder).  I reviewed the diagnosis requirements with him and it is my professional opinion that he does not have Bipolar Disorder.  He reported no history of manic episodes, despite my asking it from different angles.  He did not meet requirements per my questioning.  In addition, he informed me that Dr. Daiva Eves (here) told him that the medication she prescribed interacts negatively with his HIV medication and took it from him.  He said this was actually comforting to him.  He agreed to [employer] and find and schedule with an MD psychiatrist in the area.  He continues to experience anxiety and stomach upset.  In fact, he had a mild to moderate panic attack while waiting to see the nurse practitioner at [facility].  I was there and visited him in the lobby to reassure him that he would be okay.  He also says that he can recognize the depression now more than before.  Given his long history of suppressing his emotions, this is understandable.  Plan to meet again in one week.

## 2011-11-30 ENCOUNTER — Ambulatory Visit: Payer: Managed Care, Other (non HMO)
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
## 2011-12-12 ENCOUNTER — Ambulatory Visit: Payer: Managed Care, Other (non HMO)

## 2011-12-12 DIAGNOSIS — F331 Major depressive disorder, recurrent, moderate: Secondary | ICD-10-CM

## 2011-12-12 DIAGNOSIS — F411 Generalized anxiety disorder: Secondary | ICD-10-CM

## 2011-12-12 NOTE — Progress Notes (Signed)
Alexis Welch came in today for a session and was significantly better psychiatrically.  He was smiling and reported no recent stomach problems.  He is also talking about things in a more positive light.  In addition, he was able to follow me when I talked without having to "read lips", unlike in the past.  He said he is able to concentrate better.  We both discussed that stopping the Atripla is likely part of the reason for this improvement, but he added that reducing his stress level by taking time off from work has contributed as well.  No suicidal thoughts reported today.  Plan to meet again in one week.

## 2011-12-19 ENCOUNTER — Ambulatory Visit: Payer: Managed Care, Other (non HMO)

## 2011-12-25 ENCOUNTER — Ambulatory Visit: Payer: Managed Care, Other (non HMO)

## 2011-12-25 ENCOUNTER — Other Ambulatory Visit: Payer: Managed Care, Other (non HMO)

## 2011-12-25 DIAGNOSIS — F4310 Post-traumatic stress disorder, unspecified: Secondary | ICD-10-CM

## 2011-12-25 DIAGNOSIS — R1013 Epigastric pain: Secondary | ICD-10-CM

## 2011-12-25 DIAGNOSIS — F329 Major depressive disorder, single episode, unspecified: Secondary | ICD-10-CM

## 2011-12-25 DIAGNOSIS — B20 Human immunodeficiency virus [HIV] disease: Secondary | ICD-10-CM

## 2011-12-25 DIAGNOSIS — F331 Major depressive disorder, recurrent, moderate: Secondary | ICD-10-CM

## 2011-12-25 DIAGNOSIS — F411 Generalized anxiety disorder: Secondary | ICD-10-CM

## 2011-12-25 DIAGNOSIS — E785 Hyperlipidemia, unspecified: Secondary | ICD-10-CM
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
## 2011-12-26 LAB — CBC WITH DIFFERENTIAL/PLATELET
Eosinophils Absolute: 0.1 10*3/uL (ref 0.0–0.7)
Eosinophils Relative: 1 % (ref 0–5)
Hemoglobin: 14.9 g/dL (ref 13.0–17.0)
Lymphs Abs: 2.3 10*3/uL (ref 0.7–4.0)
MCH: 32.3 pg (ref 26.0–34.0)
MCV: 93.7 fL (ref 78.0–100.0)
Monocytes Relative: 6 % (ref 3–12)
RBC: 4.62 MIL/uL (ref 4.22–5.81)

## 2011-12-26 LAB — LIPID PANEL
HDL: 43 mg/dL (ref >39)
LDL Cholesterol: 124 mg/dL — ABNORMAL HIGH (ref 0–99)
Total CHOL/HDL Ratio: 4.6 Ratio

## 2011-12-26 LAB — T-HELPER CELL (CD4) - (RCID CLINIC ONLY): CD4 T Cell Abs: 720 uL (ref 400–2700)

## 2011-12-26 LAB — COMPLETE METABOLIC PANEL WITH GFR
ALT: 18 U/L (ref 0–53)
AST: 19 U/L (ref 0–37)
Creat: 1.26 mg/dL (ref 0.50–1.35)
Total Bilirubin: 0.8 mg/dL (ref 0.3–1.2)

## 2011-12-26 LAB — HIV-1 RNA QUANT-NO REFLEX-BLD
HIV 1 RNA Quant: <20 copies/mL (ref <20)
HIV-1 RNA Quant, Log: <1.3 {Log} (ref <1.30)

## 2011-12-26 LAB — RPR

## 2012-01-01 ENCOUNTER — Ambulatory Visit: Payer: Managed Care, Other (non HMO)

## 2012-01-01 DIAGNOSIS — F331 Major depressive disorder, recurrent, moderate: Secondary | ICD-10-CM

## 2012-01-01 DIAGNOSIS — F4310 Post-traumatic stress disorder, unspecified: Secondary | ICD-10-CM
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
## 2012-01-04 ENCOUNTER — Telehealth: Payer: Self-pay | Admitting: *Deleted

## 2012-01-04 NOTE — Telephone Encounter (Signed)
Pt forgot to send refill request to [facility] in time to not run out of medication.  Pt requesting 30-day refill to be sent to local pharmacy until the [facility] shipment comes in.  RN will speak with Franne Forts, Counselor about this request.

## 2012-01-05 ENCOUNTER — Telehealth: Payer: Self-pay | Admitting: *Deleted

## 2012-01-05 DIAGNOSIS — F419 Anxiety disorder, unspecified: Secondary | ICD-10-CM

## 2012-01-05 MED ORDER — CLONAZEPAM 1 MG PO TABS: 1.0000 mg | ORAL_TABLET | Freq: Every evening | ORAL | Status: DC | PRN

## 2012-01-05 NOTE — Telephone Encounter (Signed)
Pt forgot to order refill from [facility].  Needing in town rx until [facility] delivers.  Called to [facility] on [address].

## 2012-01-08 ENCOUNTER — Ambulatory Visit (INDEPENDENT_AMBULATORY_CARE_PROVIDER_SITE_OTHER): Payer: Managed Care, Other (non HMO) | Admitting: Infectious Disease

## 2012-01-08 ENCOUNTER — Encounter: Payer: Self-pay | Admitting: Infectious Disease

## 2012-01-08 ENCOUNTER — Ambulatory Visit: Payer: Managed Care, Other (non HMO)

## 2012-01-08 VITALS — BP 127/76 | HR 61 | Temp 98.0°F | Ht 69.0 in | Wt 148.0 lb

## 2012-01-08 DIAGNOSIS — F331 Major depressive disorder, recurrent, moderate: Secondary | ICD-10-CM

## 2012-01-08 DIAGNOSIS — F4310 Post-traumatic stress disorder, unspecified: Secondary | ICD-10-CM

## 2012-01-08 DIAGNOSIS — N289 Disorder of kidney and ureter, unspecified: Secondary | ICD-10-CM

## 2012-01-08 DIAGNOSIS — F329 Major depressive disorder, single episode, unspecified: Secondary | ICD-10-CM

## 2012-01-08 DIAGNOSIS — B20 Human immunodeficiency virus [HIV] disease: Secondary | ICD-10-CM

## 2012-01-08 NOTE — Progress Notes (Signed)
Subjective:    Patient ID: Alexis Welch, male    DOB: 1960-07-10, 51 y.o.   MRN: [id number]  HPI  50 year old Caucasian man with HIV and severe depresssion. I changed him off of Atripla at last visit to [place] and since then he has FAR less "cloudy thinking" and has had no suicidal ideation. His depressive ssx are still substantial however and he may be suffering from some aspect of PTSD. He is meeting with Bernette Redbird regularly--see his separate notes. I DO NOT THINK HE IS READY TO RETURN TO WORK at this month and anticipate that he will be out at least another 2 months--but I will defer this to his psychiatrists judgment. He believes that I had promised to see him EVERY SINGLE month but I dont recall having promised to do this over the next year and I dont think that I am the  Best provider to see him so regularly--ie I think he needs intense monitoring by a mental health professional NOT an ID MD as his HIV is itself not the problem here. HIs cr is up a little to 1.2 range possible due to pseudo elevation from tivicay. I spent greater than 45 minutes with the patient including greater than 50% of time in face to face counsel of the patient and in coordination of their care.    Review of Systems  Constitutional: Negative for fever, chills, diaphoresis, activity change, appetite change, fatigue and unexpected weight change.  HENT: Negative for congestion, sore throat, rhinorrhea, sneezing, trouble swallowing and sinus pressure.   Eyes: Negative for photophobia and visual disturbance.  Respiratory: Negative for cough, chest tightness, shortness of breath, wheezing and stridor.   Cardiovascular: Negative for chest pain, palpitations and leg swelling.  Gastrointestinal: Negative for nausea, vomiting, abdominal pain, diarrhea, constipation, blood in stool, abdominal distention and anal bleeding.  Genitourinary: Negative for dysuria, hematuria, flank pain and difficulty urinating.    Musculoskeletal: Negative for myalgias, back pain, joint swelling, arthralgias and gait problem.  Skin: Negative for color change, pallor, rash and wound.  Neurological: Negative for dizziness, tremors, weakness and light-headedness.  Hematological: Negative for adenopathy. Does not bruise/bleed easily.  Psychiatric/Behavioral: Positive for behavioral problems, sleep disturbance and dysphoric mood. Negative for suicidal ideas, decreased concentration and agitation. The patient is nervous/anxious.        Objective:   Physical Exam  Constitutional: He is oriented to person, place, and time. He appears well-developed and well-nourished. No distress.  HENT:  Head: Normocephalic and atraumatic.  Mouth/Throat: Oropharynx is clear and moist. No oropharyngeal exudate.  Eyes: Conjunctivae normal and EOM are normal. Pupils are equal, round, and reactive to light. No scleral icterus.  Neck: Normal range of motion. Neck supple. No JVD present.  Cardiovascular: Normal rate, regular rhythm and normal heart sounds.  Exam reveals no gallop and no friction rub.   No murmur heard. Pulmonary/Chest: Effort normal and breath sounds normal. No respiratory distress. He has no wheezes. He has no rales. He exhibits no tenderness.  Abdominal: He exhibits no distension and no mass. There is no tenderness. There is no rebound and no guarding.  Musculoskeletal: He exhibits no edema and no tenderness.  Lymphadenopathy:    He has no cervical adenopathy.  Neurological: He is alert and oriented to person, place, and time. He has normal reflexes. He exhibits normal muscle tone. Coordination normal.  Skin: Skin is warm and dry. He is not diaphoretic. No erythema. No pallor.  Psychiatric: His behavior is normal.  Judgment and thought content normal. His mood appears anxious. His speech is rapid and/or pressured. He exhibits a depressed mood.          Assessment & Plan:  HIV: continue Tivicay and truvada  Creatinine  elevation:--likely a pseudo elevation from tivicay, recheck in a few months  Depression prev with SI: less cognitive problems no SI off atripla but still with subtantial ssx. Folllow w Alexis Welch nd Psychiatrist. Please check drug drug interactiosn with [facility] ALWAYS.

## 2012-01-08 NOTE — Patient Instructions (Addendum)
I am OK if you need to make an appt for January as well but PLEASE BRING YOUR FORMS in as I do not think I should need to see you EVERY MONTH I think your visits with your psychiatrist are MORE CRITICAL along with visits with Bernette Redbird

## 2012-01-08 NOTE — Progress Notes (Addendum)
Alexis Welch was more emotional today, in part due to it being a deadline day for his temporary disability, and in part due to hearing from Dr. Daiva Eves today that he is to return in 4 months, instead of in one month, which is what he wanted.  He has an appointment next week with a psychiatrist, but he feels that Dr. Daiva Eves and myself are currently his only support network and it upset him that he cannot see the doctor sooner.  He understands that his HIV is under control and he realizes that his primary issues are psychiatric, but it upset him nonetheless.   He admitted to some suicidal ideation today, but reassured me that he would not act on it.  He reports that his depression has intensified lately and that it has been very hard to get out of bed some days.  He said sometimes it feels like someone is pushing him down onto the bed.  I provided some positive feedback, validated his feelings and provided some support as he talked about this and other things.  He talked about his family and how he has been the only responsible one of his siblings.  He said that he is not a person who gets more upset over the holidays, despite his family history.  Plan to meet again in one week.

## 2012-01-15 ENCOUNTER — Ambulatory Visit: Payer: Managed Care, Other (non HMO)

## 2012-01-17 ENCOUNTER — Encounter (HOSPITAL_COMMUNITY): Payer: Self-pay | Admitting: Psychiatry

## 2012-01-17 ENCOUNTER — Ambulatory Visit (INDEPENDENT_AMBULATORY_CARE_PROVIDER_SITE_OTHER): Payer: Managed Care, Other (non HMO) | Admitting: Psychiatry

## 2012-01-17 VITALS — BP 134/79 | HR 58 | Ht 69.0 in | Wt 144.0 lb

## 2012-01-17 DIAGNOSIS — F329 Major depressive disorder, single episode, unspecified: Secondary | ICD-10-CM

## 2012-01-17 MED ORDER — CITALOPRAM HYDROBROMIDE 20 MG PO TABS: ORAL_TABLET | ORAL | Status: DC
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
## 2012-01-17 NOTE — Patient Instructions (Signed)
Recommended to take Celexa 20 mg for one week and stopped.  Start Lexapro 20 mg half tablet for one week and then 1 tablet daily.

## 2012-01-18 ENCOUNTER — Telehealth (HOSPITAL_COMMUNITY): Payer: Self-pay | Admitting: *Deleted

## 2012-01-18 DIAGNOSIS — F329 Major depressive disorder, single episode, unspecified: Secondary | ICD-10-CM

## 2012-01-18 MED ORDER — ESCITALOPRAM OXALATE 20 MG PO TABS: 20.0000 mg | ORAL_TABLET | Freq: Every day | ORAL | Status: DC

## 2012-01-18 NOTE — Addendum Note (Signed)
Addended by: Tonny Bollman on: 01/18/2012 03:28 PM   Modules accepted: Orders

## 2012-01-18 NOTE — Telephone Encounter (Signed)
Pt called: Saw Dr.Arfeen yesterday.Thought Lexapro 20 mg was going to be sent to the pharmacy ([facility]). No RX ever received by them from Dr.Arfeen for Lexapro.

## 2012-01-18 NOTE — Telephone Encounter (Signed)
Per Dr.Arfeen's orders: contact pharmacy to order Lexapro 20 mg - Start with 1/2 tablet (10mg ) for 7 days and then begin taking an entire tablet (20mg )

## 2012-01-22 ENCOUNTER — Ambulatory Visit: Payer: Managed Care, Other (non HMO)

## 2012-01-22 DIAGNOSIS — F4310 Post-traumatic stress disorder, unspecified: Secondary | ICD-10-CM

## 2012-01-22 DIAGNOSIS — F331 Major depressive disorder, recurrent, moderate: Secondary | ICD-10-CM

## 2012-01-22 NOTE — Progress Notes (Addendum)
I met with Kathlene November today and we reviewed an "Activities of Daily Living Questionnaire" that he had to fill out for [employer], regarding his work disability claim through his place of employment - [employer].  He reported that he finally saw a psychiatrist - Dr. Lolly Mustache of [facility] - and was told to taper off his Celexa and start Lexapro.  He realized during the session that he was supposed to take a half tablet of the Lexapro for 2 weeks, but was taking a whole tablet, explaining why he has been exceptionally sleepy for the past 3-4 days.  He continues to report fatigue, lack of motivation, poor concentration, occasional dizziness, easily distracted, forgetfulness, and anxiety.  He reported having 2 panic attacks in the past week or so.  No current suicidal ideation.  Plan to meet again in one week.

## 2012-01-29 ENCOUNTER — Ambulatory Visit: Payer: Self-pay

## 2012-01-31 ENCOUNTER — Encounter (HOSPITAL_COMMUNITY): Payer: Self-pay | Admitting: Psychiatry

## 2012-01-31 ENCOUNTER — Ambulatory Visit (INDEPENDENT_AMBULATORY_CARE_PROVIDER_SITE_OTHER): Payer: Managed Care, Other (non HMO) | Admitting: Psychiatry

## 2012-01-31 DIAGNOSIS — F329 Major depressive disorder, single episode, unspecified: Secondary | ICD-10-CM

## 2012-01-31 MED ORDER — ESCITALOPRAM OXALATE 20 MG PO TABS: 20.0000 mg | ORAL_TABLET | Freq: Every day | ORAL | Status: DC
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
## 2012-02-01 ENCOUNTER — Ambulatory Visit: Payer: Managed Care, Other (non HMO)

## 2012-02-01 DIAGNOSIS — F331 Major depressive disorder, recurrent, moderate: Secondary | ICD-10-CM

## 2012-02-01 DIAGNOSIS — F4310 Post-traumatic stress disorder, unspecified: Secondary | ICD-10-CM

## 2012-02-01 NOTE — Progress Notes (Signed)
Alexis Welch came in today and talked about the difficulties he has had over the past 2 weeks tapering off Celexa and transitioning to Lexapro.  He reported that he was extremely irritable during this.  He told a story of yelling at his roommate and "getting in his face".  He said "that's not like me".  He said he is feeling some better now and has finally stopped the Celexa.  He also talked about visiting the psychiatrist the second time and says he likes him and believes he knows what he is doing.  He said the psychiatrist told him there is no way he could be working right now.  Plan to meet again in 2 weeks.

## 2012-02-13 ENCOUNTER — Ambulatory Visit: Payer: Managed Care, Other (non HMO)

## 2012-02-13 DIAGNOSIS — F331 Major depressive disorder, recurrent, moderate: Secondary | ICD-10-CM

## 2012-02-13 DIAGNOSIS — F4310 Post-traumatic stress disorder, unspecified: Secondary | ICD-10-CM

## 2012-02-13 NOTE — Progress Notes (Signed)
I met with Alexis Welch again today and he reported that he feels like he is in a "fog" much of the time.  He is having trouble focusing and said it is very scary when he is driving, because he becomes easily distracted.  He said "I feel drugged".  He thinks that this is possibly the result of the Lexapro, which he started taking several weeks ago.  He said it seems to have suppressed his emotions. I provided support and listened empathically as he talked about his frustrations over this feeling of being over medicated.  He said his next appointment with the psychiatrist is March 03, 2012.  Plan to meet again in one week.

## 2012-02-18 ENCOUNTER — Other Ambulatory Visit (HOSPITAL_COMMUNITY): Payer: Self-pay | Admitting: Psychiatry

## 2012-02-18 DIAGNOSIS — F329 Major depressive disorder, single episode, unspecified: Secondary | ICD-10-CM

## 2012-02-19 ENCOUNTER — Other Ambulatory Visit (HOSPITAL_COMMUNITY): Payer: Self-pay | Admitting: *Deleted

## 2012-02-19 NOTE — Telephone Encounter (Signed)
Per pharmacy, medication order for Lexapro 20 mg shown in EPIC as "phone in" on 01/31/12 is not on record. Verbal RX given at this time to [facility] Contacted pt to let him know medicine was sent to pharmacy. Pt pointed out that this happened last appt as well

## 2012-02-19 NOTE — Telephone Encounter (Deleted)
Per pharmacy, medication order for Lexapro 20 mg shown in EPIC as "phone in" on 01/31/12 is not on record. Verbal RX given at this time.

## 2012-02-20 ENCOUNTER — Other Ambulatory Visit: Payer: Self-pay | Admitting: Licensed Clinical Social Worker

## 2012-02-20 ENCOUNTER — Ambulatory Visit: Payer: Managed Care, Other (non HMO)

## 2012-02-20 ENCOUNTER — Encounter: Payer: Self-pay | Admitting: Infectious Disease

## 2012-02-20 ENCOUNTER — Ambulatory Visit (INDEPENDENT_AMBULATORY_CARE_PROVIDER_SITE_OTHER): Payer: Managed Care, Other (non HMO) | Admitting: Infectious Disease

## 2012-02-20 VITALS — BP 167/90 | HR 60 | Temp 98.3°F | Wt 143.0 lb

## 2012-02-20 DIAGNOSIS — I10 Essential (primary) hypertension: Secondary | ICD-10-CM

## 2012-02-20 DIAGNOSIS — F329 Major depressive disorder, single episode, unspecified: Secondary | ICD-10-CM

## 2012-02-20 DIAGNOSIS — F4310 Post-traumatic stress disorder, unspecified: Secondary | ICD-10-CM

## 2012-02-20 DIAGNOSIS — R413 Other amnesia: Secondary | ICD-10-CM

## 2012-02-20 DIAGNOSIS — F419 Anxiety disorder, unspecified: Secondary | ICD-10-CM

## 2012-02-20 DIAGNOSIS — B20 Human immunodeficiency virus [HIV] disease: Secondary | ICD-10-CM

## 2012-02-20 DIAGNOSIS — F331 Major depressive disorder, recurrent, moderate: Secondary | ICD-10-CM

## 2012-02-20 DIAGNOSIS — F3289 Other specified depressive episodes: Secondary | ICD-10-CM

## 2012-02-20 MED ORDER — DOLUTEGRAVIR SODIUM 50 MG PO TABS: 50.0000 mg | ORAL_TABLET | Freq: Every day | ORAL | Status: DC

## 2012-02-20 MED ORDER — EMTRICITABINE-TENOFOVIR DF 200-300 MG PO TABS: 1.0000 | ORAL_TABLET | Freq: Every day | ORAL | Status: DC

## 2012-02-20 MED ORDER — AMLODIPINE BESYLATE 5 MG PO TABS: 5.0000 mg | ORAL_TABLET | Freq: Every day | ORAL | Status: DC
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
## 2012-02-20 NOTE — Progress Notes (Signed)
Subjective:    Patient ID: Alexis Welch, male    DOB: [birth year], 52 y.o.   MRN: [id number]  HPI  Alexis Welch is a 52 y.o. male who is doing superbly well on his  antiviral regimen, of tivicay and truvada  with undetectable viral load and health cd4 count. He is seeing  Dr. Lolly Mustache from psychiatry who is managing his psychiatric issues along with counselling from Bernette Redbird here in the clinic. He is making good progress he feels. Still feels in a fog at times. He likes new ARV much better. Is having troubles with what he thinks is spastic colon and occasional fecal incontinence but he believes these are due to the antidepressants. He has had elevated BP today in clinic and we have noted this for sometime. I believe we should put him on a low dose anti-HTNsive medication. Alexis Welch will also purchase a BP cuff for home. I agree that Alexis Welch is AbSOLUTELY NOT SUITED FOR RETURN TO WORK AT THIS TIME AND I SUPPORT PERMANENT DISABILITY CONSIDERATION BUT WILL DEFER TO PSYCHIATRY.  I spent greater than 45 minutes with the patient including greater than 50% of time in face to face counsel of the patient and in coordination of their care.  Review of Systems  Constitutional: Negative for fever, chills, diaphoresis, activity change, appetite change, fatigue and unexpected weight change.  HENT: Negative for congestion, sore throat, rhinorrhea, sneezing, trouble swallowing and sinus pressure.   Eyes: Negative for photophobia and visual disturbance.  Respiratory: Negative for cough, chest tightness, shortness of breath, wheezing and stridor.   Cardiovascular: Negative for chest pain, palpitations and leg swelling.  Gastrointestinal: Positive for diarrhea. Negative for nausea, vomiting, abdominal pain, constipation, blood in stool, abdominal distention and anal bleeding.  Genitourinary: Negative for dysuria, hematuria, flank pain and difficulty urinating.  Musculoskeletal: Negative for myalgias, back pain, joint  swelling, arthralgias and gait problem.  Skin: Negative for color change, pallor, rash and wound.  Neurological: Negative for dizziness, tremors, weakness and light-headedness.  Hematological: Negative for adenopathy. Does not bruise/bleed easily.  Psychiatric/Behavioral: Positive for dysphoric mood. Negative for confusion, sleep disturbance, decreased concentration and agitation. The patient is nervous/anxious.        Objective:   Physical Exam  Constitutional: He is oriented to person, place, and time. He appears well-developed and well-nourished. No distress.  HENT:  Head: Normocephalic and atraumatic.  Mouth/Throat: Oropharynx is clear and moist. No oropharyngeal exudate.  Eyes: Conjunctivae normal and EOM are normal. Pupils are equal, round, and reactive to light. No scleral icterus.  Neck: Normal range of motion. Neck supple. No JVD present.  Cardiovascular: Normal rate, regular rhythm and normal heart sounds.  Exam reveals no gallop and no friction rub.   No murmur heard. Pulmonary/Chest: Effort normal and breath sounds normal. No respiratory distress. He has no wheezes. He has no rales. He exhibits no tenderness.  Abdominal: He exhibits no distension and no mass. There is no tenderness. There is no rebound and no guarding.  Musculoskeletal: He exhibits no edema and no tenderness.  Lymphadenopathy:    He has no cervical adenopathy.  Neurological: He is alert and oriented to person, place, and time. He exhibits normal muscle tone. Coordination normal.  Skin: Skin is warm and dry. He is not diaphoretic. No erythema. No pallor.  Psychiatric: His behavior is normal. Judgment and thought content normal. His mood appears anxious.          Assessment & Plan:  HIV: continue tivicay and truvada, check hla  [id number] at next lab drawn so that he can go to new [employer] all in one pill down the road  Depression: in excellent care of Dr. Lolly Mustache and Bernette Redbird  HTN: start low dose norvasc. counselled  him to monitor bp at home and report to me and use caution when rising suddenly from seated or laying down when first starting the med  Disability: see my letter and note above. I support consideration of permanent disability.

## 2012-02-21 ENCOUNTER — Other Ambulatory Visit: Payer: Self-pay | Admitting: Licensed Clinical Social Worker

## 2012-02-21 DIAGNOSIS — R413 Other amnesia: Secondary | ICD-10-CM

## 2012-02-21 DIAGNOSIS — F329 Major depressive disorder, single episode, unspecified: Secondary | ICD-10-CM

## 2012-02-21 DIAGNOSIS — B20 Human immunodeficiency virus [HIV] disease: Secondary | ICD-10-CM

## 2012-02-21 MED ORDER — EMTRICITABINE-TENOFOVIR DF 200-300 MG PO TABS: 1.0000 | ORAL_TABLET | Freq: Every day | ORAL | Status: DC

## 2012-02-21 MED ORDER — DOLUTEGRAVIR SODIUM 50 MG PO TABS: 50.0000 mg | ORAL_TABLET | Freq: Every day | ORAL | Status: DC

## 2012-02-27 ENCOUNTER — Ambulatory Visit: Payer: Managed Care, Other (non HMO)

## 2012-02-27 DIAGNOSIS — F331 Major depressive disorder, recurrent, moderate: Secondary | ICD-10-CM

## 2012-02-27 DIAGNOSIS — F4310 Post-traumatic stress disorder, unspecified: Secondary | ICD-10-CM
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
## 2012-02-29 ENCOUNTER — Encounter (HOSPITAL_COMMUNITY): Payer: Self-pay | Admitting: Psychiatry

## 2012-02-29 ENCOUNTER — Ambulatory Visit (INDEPENDENT_AMBULATORY_CARE_PROVIDER_SITE_OTHER): Payer: Self-pay | Admitting: Psychiatry

## 2012-02-29 VITALS — BP 148/91 | HR 65 | Wt 140.8 lb

## 2012-02-29 DIAGNOSIS — F329 Major depressive disorder, single episode, unspecified: Secondary | ICD-10-CM

## 2012-02-29 MED ORDER — ESCITALOPRAM OXALATE 10 MG PO TABS: 10.0000 mg | ORAL_TABLET | Freq: Every day | ORAL | Status: DC
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
## 2012-03-05 ENCOUNTER — Ambulatory Visit: Payer: Self-pay

## 2012-03-07 ENCOUNTER — Ambulatory Visit: Payer: Managed Care, Other (non HMO)

## 2012-03-07 ENCOUNTER — Other Ambulatory Visit: Payer: Self-pay | Admitting: Licensed Clinical Social Worker

## 2012-03-07 DIAGNOSIS — F331 Major depressive disorder, recurrent, moderate: Secondary | ICD-10-CM

## 2012-03-07 DIAGNOSIS — F419 Anxiety disorder, unspecified: Secondary | ICD-10-CM

## 2012-03-07 DIAGNOSIS — F4310 Post-traumatic stress disorder, unspecified: Secondary | ICD-10-CM

## 2012-03-07 MED ORDER — CLONAZEPAM 1 MG PO TABS: 1.0000 mg | ORAL_TABLET | Freq: Every evening | ORAL | Status: DC | PRN

## 2012-03-07 NOTE — Progress Notes (Signed)
Alexis Welch seems to be more depressed than before.  He seems to be worn down.  He said Dr. Lolly Mustache has lowered his Lexapro to 10 mg and is talking about changing to another anti-depressant.  Based on what I have seen it is not the medication for him.  It appears to have dulled his senses, but not touched the depressive symptoms.  I went back over the basic concepts of Cognitive Behavior Therapy (CBT), explaining how emotions come from thoughts and not situations.  He says he doesn't have thoughts, but I explained that they are there, but are so automatic that he doesn't realize it and that he should begin to pay attention to them.  He admits to passive suicidal ideation, but no intent and no plan.  Plan to meet again in one week.

## 2012-03-18 ENCOUNTER — Ambulatory Visit: Payer: Self-pay

## 2012-03-21 ENCOUNTER — Ambulatory Visit: Payer: Managed Care, Other (non HMO)

## 2012-03-21 DIAGNOSIS — F331 Major depressive disorder, recurrent, moderate: Secondary | ICD-10-CM

## 2012-03-21 DIAGNOSIS — F4310 Post-traumatic stress disorder, unspecified: Secondary | ICD-10-CM

## 2012-03-21 NOTE — Progress Notes (Signed)
Alexis Welch was much better today than his last 2 sessions - appearing to be more alert and pleasant.  He was able to smile and laugh at times today.  He did report that he is sleeping too much and that he has to make himself eat.  He also endorses dissociative symptoms, particularly when driving, resulting in having to turn around to get where he was going.  I explained that persons with trauma histories tend to dissociate more than average.  I listened empathically and provided support.  Plan to meet in a week and a half.

## 2012-03-28 ENCOUNTER — Ambulatory Visit: Payer: Self-pay

## 2012-03-28 ENCOUNTER — Ambulatory Visit (HOSPITAL_COMMUNITY): Payer: Self-pay | Admitting: Psychiatry

## 2012-04-01 ENCOUNTER — Ambulatory Visit: Payer: Managed Care, Other (non HMO)

## 2012-04-01 DIAGNOSIS — F4310 Post-traumatic stress disorder, unspecified: Secondary | ICD-10-CM

## 2012-04-01 DIAGNOSIS — F331 Major depressive disorder, recurrent, moderate: Secondary | ICD-10-CM
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
## 2012-04-03 ENCOUNTER — Encounter (HOSPITAL_COMMUNITY): Payer: Self-pay | Admitting: Psychiatry

## 2012-04-03 ENCOUNTER — Ambulatory Visit (INDEPENDENT_AMBULATORY_CARE_PROVIDER_SITE_OTHER): Payer: Self-pay | Admitting: Psychiatry

## 2012-04-03 VITALS — BP 146/108 | HR 83 | Wt 144.2 lb

## 2012-04-03 DIAGNOSIS — F329 Major depressive disorder, single episode, unspecified: Secondary | ICD-10-CM

## 2012-04-03 DIAGNOSIS — F0630 Mood disorder due to known physiological condition, unspecified: Secondary | ICD-10-CM

## 2012-04-03 MED ORDER — LAMOTRIGINE 25 MG PO TABS: ORAL_TABLET | ORAL | Status: DC
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
## 2012-04-08 ENCOUNTER — Other Ambulatory Visit (HOSPITAL_COMMUNITY): Payer: Self-pay | Admitting: Psychiatry

## 2012-04-09 ENCOUNTER — Ambulatory Visit: Payer: Managed Care, Other (non HMO)

## 2012-04-09 ENCOUNTER — Telehealth: Payer: Self-pay | Admitting: Licensed Clinical Social Worker

## 2012-04-09 ENCOUNTER — Other Ambulatory Visit: Payer: Self-pay | Admitting: Licensed Clinical Social Worker

## 2012-04-09 ENCOUNTER — Other Ambulatory Visit (HOSPITAL_COMMUNITY): Payer: Self-pay | Admitting: Psychiatry

## 2012-04-09 DIAGNOSIS — F419 Anxiety disorder, unspecified: Secondary | ICD-10-CM

## 2012-04-09 MED ORDER — CLONAZEPAM 1 MG PO TABS: 1.0000 mg | ORAL_TABLET | Freq: Every evening | ORAL | Status: DC | PRN

## 2012-04-09 NOTE — Progress Notes (Unsigned)
Alexis Welch was pleasant today, but reported that his roommate was coughing last night and so he didn't sleep well.  Two or three times during the session he would lose track of what he was talking about.  I attributed this more to the lack of sleep than anything else.  He also reported that his psychiatrist wants him to try Lamictal, if it is compatible with his HIV medication.  I facilitated

## 2012-04-09 NOTE — Telephone Encounter (Signed)
Patient was prescribed Lamictal by Dr. Lolly Mustache, but patient was told  not to start until he made sure that it did not interact with his HIV medications. Please advise

## 2012-04-09 NOTE — Telephone Encounter (Signed)
THere are no drug drug interactions. THanks to his MD for asking!

## 2012-04-16 ENCOUNTER — Ambulatory Visit: Payer: Self-pay

## 2012-04-23 ENCOUNTER — Ambulatory Visit: Payer: Self-pay

## 2012-04-24 ENCOUNTER — Other Ambulatory Visit: Payer: Managed Care, Other (non HMO)

## 2012-04-25 ENCOUNTER — Other Ambulatory Visit: Payer: Self-pay | Admitting: Infectious Disease

## 2012-04-25 ENCOUNTER — Other Ambulatory Visit (INDEPENDENT_AMBULATORY_CARE_PROVIDER_SITE_OTHER): Payer: Managed Care, Other (non HMO)

## 2012-04-25 ENCOUNTER — Ambulatory Visit: Payer: Managed Care, Other (non HMO)

## 2012-04-25 DIAGNOSIS — B20 Human immunodeficiency virus [HIV] disease: Secondary | ICD-10-CM

## 2012-04-25 DIAGNOSIS — F4310 Post-traumatic stress disorder, unspecified: Secondary | ICD-10-CM

## 2012-04-25 DIAGNOSIS — F331 Major depressive disorder, recurrent, moderate: Secondary | ICD-10-CM

## 2012-04-25 LAB — CBC WITH DIFFERENTIAL/PLATELET
Eosinophils Relative: 1 % (ref 0–5)
Lymphocytes Relative: 32 % (ref 12–46)
Lymphs Abs: 2 10*3/uL (ref 0.7–4.0)
MCV: 86.1 fL (ref 78.0–100.0)
Platelets: 193 10*3/uL (ref 150–400)
RBC: 5.19 MIL/uL (ref 4.22–5.81)
WBC: 6.3 10*3/uL (ref 4.0–10.5)

## 2012-04-25 LAB — COMPLETE METABOLIC PANEL WITH GFR
ALT: 21 U/L (ref 0–53)
AST: 19 U/L (ref 0–37)
Albumin: 4 g/dL (ref 3.5–5.2)
Alkaline Phosphatase: 122 U/L — ABNORMAL HIGH (ref 39–117)
Glucose, Bld: 102 mg/dL — ABNORMAL HIGH (ref 70–99)
Potassium: 3.6 mEq/L (ref 3.5–5.3)
Sodium: 139 mEq/L (ref 135–145)
Total Protein: 6.4 g/dL (ref 6.0–8.3)

## 2012-04-25 LAB — LIPID PANEL: LDL Cholesterol: 119 mg/dL — ABNORMAL HIGH (ref 0–99)
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
## 2012-04-26 LAB — T-HELPER CELL (CD4) - (RCID CLINIC ONLY)
CD4 % Helper T Cell: 32 % — ABNORMAL LOW (ref 33–55)
CD4 T Cell Abs: 600 uL (ref 400–2700)

## 2012-04-28 LAB — HIV-1 RNA QUANT-NO REFLEX-BLD: HIV 1 RNA Quant: <20 copies/mL (ref <20)

## 2012-04-30 ENCOUNTER — Ambulatory Visit: Payer: Self-pay

## 2012-05-01 ENCOUNTER — Ambulatory Visit (HOSPITAL_COMMUNITY): Payer: Self-pay | Admitting: Psychiatry

## 2012-05-01 ENCOUNTER — Encounter (HOSPITAL_COMMUNITY): Payer: Self-pay

## 2012-05-02 LAB — HLA B*5701: HLA-B*5701: NEGATIVE

## 2012-05-07 ENCOUNTER — Ambulatory Visit: Payer: Managed Care, Other (non HMO)

## 2012-05-07 DIAGNOSIS — F4310 Post-traumatic stress disorder, unspecified: Secondary | ICD-10-CM

## 2012-05-07 DIAGNOSIS — F331 Major depressive disorder, recurrent, moderate: Secondary | ICD-10-CM

## 2012-05-07 NOTE — Progress Notes (Signed)
Alexis Welch reported today that his diarrhea has finally gone away and his appetite has improved.  He was in good spirits, but is still somewhat concerned about his insurance and some of the confusing communications regarding his Long Term Disability.  He also talked about his oldest sister, who is mentally ill and calls him 25 times a day and leaves incomprehensible messages.  No safety concerns.  Plan to meet again in one week.

## 2012-05-08 ENCOUNTER — Encounter: Payer: Self-pay | Admitting: Infectious Disease

## 2012-05-08 ENCOUNTER — Encounter (HOSPITAL_COMMUNITY): Payer: Self-pay | Admitting: Psychiatry

## 2012-05-08 ENCOUNTER — Ambulatory Visit (INDEPENDENT_AMBULATORY_CARE_PROVIDER_SITE_OTHER): Payer: Managed Care, Other (non HMO) | Admitting: Infectious Disease

## 2012-05-08 ENCOUNTER — Ambulatory Visit (INDEPENDENT_AMBULATORY_CARE_PROVIDER_SITE_OTHER): Payer: Managed Care, Other (non HMO) | Admitting: Psychiatry

## 2012-05-08 VITALS — Temp 97.8°F | Ht 68.0 in | Wt 149.0 lb

## 2012-05-08 VITALS — BP 116/70 | HR 51 | Wt 149.6 lb

## 2012-05-08 DIAGNOSIS — F39 Unspecified mood [affective] disorder: Secondary | ICD-10-CM

## 2012-05-08 DIAGNOSIS — F3289 Other specified depressive episodes: Secondary | ICD-10-CM

## 2012-05-08 DIAGNOSIS — F329 Major depressive disorder, single episode, unspecified: Secondary | ICD-10-CM

## 2012-05-08 DIAGNOSIS — F411 Generalized anxiety disorder: Secondary | ICD-10-CM

## 2012-05-08 DIAGNOSIS — F0630 Mood disorder due to known physiological condition, unspecified: Secondary | ICD-10-CM

## 2012-05-08 DIAGNOSIS — B20 Human immunodeficiency virus [HIV] disease: Secondary | ICD-10-CM

## 2012-05-08 DIAGNOSIS — I10 Essential (primary) hypertension: Secondary | ICD-10-CM

## 2012-05-08 DIAGNOSIS — F419 Anxiety disorder, unspecified: Secondary | ICD-10-CM

## 2012-05-08 MED ORDER — ESCITALOPRAM OXALATE 10 MG PO TABS: 10.0000 mg | ORAL_TABLET | Freq: Every day | ORAL | Status: DC

## 2012-05-08 MED ORDER — LAMOTRIGINE 25 MG PO TABS: ORAL_TABLET | ORAL | Status: DC

## 2012-05-08 MED ORDER — CLONAZEPAM 1 MG PO TABS: 1.0000 mg | ORAL_TABLET | Freq: Every evening | ORAL | Status: DC | PRN

## 2012-05-08 MED ORDER — AMLODIPINE BESYLATE 5 MG PO TABS: 5.0000 mg | ORAL_TABLET | Freq: Every day | ORAL | Status: DC

## 2012-05-08 NOTE — Progress Notes (Signed)
  Subjective:    Patient ID: Alexis Welch, male    DOB: October 26, 1960, 52 y.o.   MRN: [id number]  HPI   Alexis Welch is a 52 y.o. male who is doing superbly well on his  antiviral regimen, of tivicay and truvada  with undetectable viral load and health cd4 count. He has been  seeing Dr. Lolly Mustache from psychiatry who is managing his psychiatric issues along with counselling from Bernette Redbird here in the clinic. He currently will be out of insurance and in the interim we'll not be able to see Dr. spell ARFEen,  Mood is greatly improved and he feels he is making very good progress talking to [facility] I refilled his antidepressants for now. He will roll the AIDS drug assistance program  Review of Systems  Constitutional: Negative for fever, chills, diaphoresis, activity change, appetite change, fatigue and unexpected weight change.  HENT: Negative for congestion, sore throat, rhinorrhea, sneezing, trouble swallowing and sinus pressure.   Eyes: Negative for photophobia and visual disturbance.  Respiratory: Negative for cough, chest tightness, shortness of breath, wheezing and stridor.   Cardiovascular: Negative for chest pain, palpitations and leg swelling.  Gastrointestinal: Positive for diarrhea. Negative for nausea, vomiting, abdominal pain, constipation, blood in stool, abdominal distention and anal bleeding.  Genitourinary: Negative for dysuria, hematuria, flank pain and difficulty urinating.  Musculoskeletal: Negative for myalgias, back pain, joint swelling, arthralgias and gait problem.  Skin: Negative for color change, pallor, rash and wound.  Neurological: Negative for dizziness, tremors, weakness and light-headedness.  Hematological: Negative for adenopathy. Does not bruise/bleed easily.  Psychiatric/Behavioral: Positive for dysphoric mood. Negative for confusion, sleep disturbance, decreased concentration and agitation. The patient is nervous/anxious.        Objective:   Physical Exam   Constitutional: He is oriented to person, place, and time. He appears well-developed and well-nourished. No distress.  HENT:  Head: Normocephalic and atraumatic.  Mouth/Throat: Oropharynx is clear and moist. No oropharyngeal exudate.  Eyes: Conjunctivae and EOM are normal. Pupils are equal, round, and reactive to light. No scleral icterus.  Neck: Normal range of motion. Neck supple. No JVD present.  Cardiovascular: Normal rate, regular rhythm and normal heart sounds.  Exam reveals no gallop and no friction rub.   No murmur heard. Pulmonary/Chest: Effort normal and breath sounds normal. No respiratory distress. He has no wheezes. He has no rales. He exhibits no tenderness.  Abdominal: He exhibits no distension and no mass. There is no tenderness. There is no rebound and no guarding.  Musculoskeletal: He exhibits no edema and no tenderness.  Lymphadenopathy:    He has no cervical adenopathy.  Neurological: He is alert and oriented to person, place, and time. He exhibits normal muscle tone. Coordination normal.  Skin: Skin is warm and dry. He is not diaphoretic. No erythema. No pallor.  Psychiatric: His behavior is normal. Judgment and thought content normal. His mood appears anxious.          Assessment & Plan:  HIV: continue tivicay and truvada, hla [id number] negative so could go to new [employer] all in one pill down the road  Depression: Continue current medications continue to see Bernette Redbird and hopefully when he has some kind of coverage also with Dr. Lolly Mustache  HTN: continue  norvasc.   Disability: see my letter and notes I support consideration of permanent disability.
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
## 2012-05-14 ENCOUNTER — Ambulatory Visit: Payer: Self-pay

## 2012-05-23 ENCOUNTER — Ambulatory Visit: Payer: Self-pay

## 2012-05-23 ENCOUNTER — Ambulatory Visit: Payer: Managed Care, Other (non HMO)

## 2012-05-23 NOTE — Progress Notes (Signed)
I met with Alexis Welch today and he seemed to be in fairly good spirits. (note to be completed later)

## 2012-05-24 ENCOUNTER — Other Ambulatory Visit: Payer: Self-pay | Admitting: *Deleted

## 2012-05-24 DIAGNOSIS — F329 Major depressive disorder, single episode, unspecified: Secondary | ICD-10-CM

## 2012-05-24 DIAGNOSIS — R413 Other amnesia: Secondary | ICD-10-CM

## 2012-05-24 DIAGNOSIS — B20 Human immunodeficiency virus [HIV] disease: Secondary | ICD-10-CM

## 2012-05-24 MED ORDER — DOLUTEGRAVIR SODIUM 50 MG PO TABS: 50.0000 mg | ORAL_TABLET | Freq: Every day | ORAL | Status: DC

## 2012-05-24 MED ORDER — EMTRICITABINE-TENOFOVIR DF 200-300 MG PO TABS: 1.0000 | ORAL_TABLET | Freq: Every day | ORAL | Status: DC

## 2012-05-24 NOTE — Progress Notes (Signed)
For ADAP application

## 2012-06-03 ENCOUNTER — Ambulatory Visit: Payer: Self-pay

## 2012-06-03 ENCOUNTER — Encounter: Payer: Self-pay | Admitting: *Deleted

## 2012-06-03 ENCOUNTER — Encounter: Payer: Self-pay | Admitting: Infectious Diseases

## 2012-06-03 ENCOUNTER — Telehealth: Payer: Self-pay | Admitting: *Deleted

## 2012-06-03 ENCOUNTER — Ambulatory Visit (INDEPENDENT_AMBULATORY_CARE_PROVIDER_SITE_OTHER): Payer: Self-pay | Admitting: Infectious Diseases

## 2012-06-03 DIAGNOSIS — F4310 Post-traumatic stress disorder, unspecified: Secondary | ICD-10-CM

## 2012-06-03 DIAGNOSIS — F331 Major depressive disorder, recurrent, moderate: Secondary | ICD-10-CM

## 2012-06-03 DIAGNOSIS — B20 Human immunodeficiency virus [HIV] disease: Secondary | ICD-10-CM

## 2012-06-03 MED ORDER — METHOCARBAMOL 500 MG PO TABS: 500.0000 mg | ORAL_TABLET | Freq: Four times a day (QID) | ORAL | Status: DC

## 2012-06-03 NOTE — Assessment & Plan Note (Signed)
HIV 1 RNA Quant (copies/mL)  Date Value  04/25/2012 <20   12/25/2011 <20   10/31/2011 <20      CD4 T Cell Abs (cmm)  Date Value  04/25/2012 600   12/25/2011 720   10/31/2011 820     Has been doing well, will f/u with Dr Daiva Eves. Will get him in with med assistance and get his medications reset.
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
## 2012-06-06 ENCOUNTER — Ambulatory Visit: Payer: Self-pay

## 2012-06-07 ENCOUNTER — Telehealth: Payer: Self-pay | Admitting: *Deleted

## 2012-06-07 NOTE — Telephone Encounter (Signed)
Patient called to check and see if his ADAP had been approved. I asked Britta Mccreedy to call ADAP and find out and she advised they have received it and the applications are worked in the order they are received and that as soon as it is processed they will let us know. She also checked and the app was mailed 05/24/12 and it should be any day now. Advised the patient of this and he advised that he has been out of his meds for 2 weeks now and wanted to make sure that he doctor was aware of this. Advised will let him know he also asked what I was going to do to make this happen for him as his doctor would not let this happen. Advised the patient he will have to wait until he is approved and that as soon as we get notification we will send his Rx to pharmacy and give him a call. He was very upset and advised he "will just have to speak to Dr Daiva Eves as he will not be happy and he will make things happen!". Again I appologized for the process and repeated will call as soon as we get word from ADAP. Ended call with Thank you and have a great weekend.

## 2012-06-10 ENCOUNTER — Telehealth: Payer: Self-pay | Admitting: *Deleted

## 2012-06-10 NOTE — Telephone Encounter (Signed)
Called patient to advised him that his ADAP has been approved and ask if he wants his Rx sent to [facility] or [place]. Had to leave a message for him to call the office asap.

## 2012-06-11 ENCOUNTER — Ambulatory Visit: Payer: Self-pay

## 2012-06-12 ENCOUNTER — Telehealth: Payer: Self-pay | Admitting: *Deleted

## 2012-06-12 ENCOUNTER — Ambulatory Visit: Payer: Self-pay

## 2012-06-12 ENCOUNTER — Other Ambulatory Visit: Payer: Self-pay | Admitting: *Deleted

## 2012-06-12 DIAGNOSIS — B20 Human immunodeficiency virus [HIV] disease: Secondary | ICD-10-CM

## 2012-06-12 DIAGNOSIS — F329 Major depressive disorder, single episode, unspecified: Secondary | ICD-10-CM

## 2012-06-12 DIAGNOSIS — F419 Anxiety disorder, unspecified: Secondary | ICD-10-CM

## 2012-06-12 DIAGNOSIS — R413 Other amnesia: Secondary | ICD-10-CM

## 2012-06-12 DIAGNOSIS — I10 Essential (primary) hypertension: Secondary | ICD-10-CM

## 2012-06-12 MED ORDER — LAMOTRIGINE 25 MG PO TABS: 50.0000 mg | ORAL_TABLET | Freq: Every day | ORAL | Status: DC

## 2012-06-12 MED ORDER — EMTRICITABINE-TENOFOVIR DF 200-300 MG PO TABS: 1.0000 | ORAL_TABLET | Freq: Every day | ORAL | Status: DC

## 2012-06-12 MED ORDER — CLONAZEPAM 1 MG PO TABS: 1.0000 mg | ORAL_TABLET | Freq: Every evening | ORAL | Status: DC | PRN

## 2012-06-12 MED ORDER — ESCITALOPRAM OXALATE 10 MG PO TABS: 10.0000 mg | ORAL_TABLET | Freq: Every day | ORAL | Status: DC

## 2012-06-12 MED ORDER — DOLUTEGRAVIR SODIUM 50 MG PO TABS: 50.0000 mg | ORAL_TABLET | Freq: Every day | ORAL | Status: DC

## 2012-06-12 MED ORDER — AMLODIPINE BESYLATE 5 MG PO TABS: 5.0000 mg | ORAL_TABLET | Freq: Every day | ORAL | Status: DC

## 2012-06-12 NOTE — Telephone Encounter (Signed)
Pt requesting refill for Lamictal which was originally prescribed by his psychiatrist, Dr Lolly Mustache, 04/03/12.  Dr. Lolly Mustache mentioned in his OV note to check with Dr. Daiva Eves about possible problem with Lamictal and HIV rxes.  RN spoke with Dr. Daiva Eves.  OK to be taken with HIV rxes and OK to refill with his name.   It is not covered by ADAP.

## 2012-06-27 ENCOUNTER — Ambulatory Visit: Payer: Self-pay

## 2012-06-27 DIAGNOSIS — F4310 Post-traumatic stress disorder, unspecified: Secondary | ICD-10-CM

## 2012-06-27 DIAGNOSIS — F331 Major depressive disorder, recurrent, moderate: Secondary | ICD-10-CM
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
## 2012-07-09 ENCOUNTER — Ambulatory Visit (INDEPENDENT_AMBULATORY_CARE_PROVIDER_SITE_OTHER): Payer: Self-pay | Admitting: Infectious Disease

## 2012-07-09 ENCOUNTER — Encounter: Payer: Self-pay | Admitting: Infectious Disease

## 2012-07-09 VITALS — BP 124/86 | HR 61 | Temp 97.7°F | Ht 69.0 in | Wt 144.2 lb

## 2012-07-09 DIAGNOSIS — Z87828 Personal history of other (healed) physical injury and trauma: Secondary | ICD-10-CM

## 2012-07-09 DIAGNOSIS — I10 Essential (primary) hypertension: Secondary | ICD-10-CM

## 2012-07-09 DIAGNOSIS — F4310 Post-traumatic stress disorder, unspecified: Secondary | ICD-10-CM

## 2012-07-09 DIAGNOSIS — R413 Other amnesia: Secondary | ICD-10-CM

## 2012-07-09 DIAGNOSIS — B20 Human immunodeficiency virus [HIV] disease: Secondary | ICD-10-CM

## 2012-07-09 DIAGNOSIS — F329 Major depressive disorder, single episode, unspecified: Secondary | ICD-10-CM
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
## 2012-07-10 ENCOUNTER — Telehealth: Payer: Self-pay | Admitting: *Deleted

## 2012-07-10 ENCOUNTER — Ambulatory Visit (INDEPENDENT_AMBULATORY_CARE_PROVIDER_SITE_OTHER): Payer: Self-pay | Admitting: Psychiatry

## 2012-07-10 ENCOUNTER — Encounter (HOSPITAL_COMMUNITY): Payer: Self-pay | Admitting: Psychiatry

## 2012-07-10 VITALS — BP 139/78 | HR 67 | Ht 69.0 in | Wt 143.2 lb

## 2012-07-10 DIAGNOSIS — F329 Major depressive disorder, single episode, unspecified: Secondary | ICD-10-CM

## 2012-07-10 DIAGNOSIS — F0630 Mood disorder due to known physiological condition, unspecified: Secondary | ICD-10-CM

## 2012-07-10 MED ORDER — LAMOTRIGINE 25 MG PO TABS: ORAL_TABLET | ORAL | Status: DC

## 2012-07-10 NOTE — Telephone Encounter (Signed)
zofran 4mg  qid prn nausea

## 2012-07-10 NOTE — Telephone Encounter (Signed)
Patient called and advised he was in to see the doctor on yesterday 07/09/12. He advised that at the visit he was offered something for nausea and did not think he needed it. He has since started to have more nausea and wants something called to his pharmacy. When asked if he has anything recently that helped with the nausea he advised no but anything will be helpful. Advised that patient his doctor is in clinic at this time but will ask and if he agrees to prescribe him something we can send it to the pharmacy for him.
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
## 2012-07-11 ENCOUNTER — Other Ambulatory Visit: Payer: Self-pay | Admitting: *Deleted

## 2012-07-11 ENCOUNTER — Ambulatory Visit: Payer: Self-pay

## 2012-07-11 DIAGNOSIS — R110 Nausea: Secondary | ICD-10-CM

## 2012-07-11 DIAGNOSIS — F331 Major depressive disorder, recurrent, moderate: Secondary | ICD-10-CM

## 2012-07-11 DIAGNOSIS — F4310 Post-traumatic stress disorder, unspecified: Secondary | ICD-10-CM

## 2012-07-11 MED ORDER — ONDANSETRON HCL 4 MG PO TABS: 4.0000 mg | ORAL_TABLET | Freq: Three times a day (TID) | ORAL | Status: DC | PRN
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
## 2012-07-11 NOTE — Telephone Encounter (Signed)
Patient in clinic today to see Bernette Redbird informed him that I sent an Rx to [facility] for Zofran 4 mg and he can pick it up at his convenience.

## 2012-07-25 ENCOUNTER — Ambulatory Visit: Payer: Self-pay

## 2012-07-25 DIAGNOSIS — F331 Major depressive disorder, recurrent, moderate: Secondary | ICD-10-CM

## 2012-07-25 DIAGNOSIS — F4310 Post-traumatic stress disorder, unspecified: Secondary | ICD-10-CM
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
## 2012-08-08 ENCOUNTER — Telehealth (HOSPITAL_COMMUNITY): Payer: Self-pay | Admitting: *Deleted

## 2012-08-08 DIAGNOSIS — F329 Major depressive disorder, single episode, unspecified: Secondary | ICD-10-CM

## 2012-08-08 MED ORDER — DIVALPROEX SODIUM ER 250 MG PO TB24: 250.0000 mg | ORAL_TABLET | Freq: Two times a day (BID) | ORAL | Status: DC

## 2012-08-08 NOTE — Telephone Encounter (Addendum)
Dr.Arfeen reviewed information given in phone call. Ordered Depakote ER 250 mg twice daily. Contacted patient, left message that MD given message and new medicine was chosen from [employer] Formulary. Instructed to contact office for questions and concerns

## 2012-08-08 NOTE — Addendum Note (Signed)
Addended by: Tonny Bollman on: 08/08/2012 03:07 PM   Modules accepted: Orders, Medications

## 2012-08-08 NOTE — Telephone Encounter (Signed)
[id number] is not covered by ADAP and is too expensive out of pocket. Current dose, 25 mg 3 x day is $60. Pt states he stopped taking it due to cost. Wants to know if there is something that is on the [employer] formulary that he could take instead of Lamictal.

## 2012-08-13 NOTE — Telephone Encounter (Signed)
Opened in error

## 2012-08-22 ENCOUNTER — Ambulatory Visit: Payer: Self-pay

## 2012-08-29 ENCOUNTER — Other Ambulatory Visit: Payer: Self-pay

## 2012-09-12 ENCOUNTER — Other Ambulatory Visit (INDEPENDENT_AMBULATORY_CARE_PROVIDER_SITE_OTHER): Payer: Self-pay

## 2012-09-12 ENCOUNTER — Ambulatory Visit: Payer: Self-pay | Admitting: Infectious Disease

## 2012-09-12 ENCOUNTER — Ambulatory Visit: Payer: Self-pay

## 2012-09-12 ENCOUNTER — Ambulatory Visit (HOSPITAL_COMMUNITY): Payer: Self-pay | Admitting: Psychiatry

## 2012-09-12 DIAGNOSIS — Z113 Encounter for screening for infections with a predominantly sexual mode of transmission: Secondary | ICD-10-CM

## 2012-09-12 DIAGNOSIS — F331 Major depressive disorder, recurrent, moderate: Secondary | ICD-10-CM

## 2012-09-12 DIAGNOSIS — B20 Human immunodeficiency virus [HIV] disease: Secondary | ICD-10-CM

## 2012-09-12 DIAGNOSIS — F4310 Post-traumatic stress disorder, unspecified: Secondary | ICD-10-CM

## 2012-09-12 LAB — COMPLETE METABOLIC PANEL WITH GFR
Albumin: 4.3 g/dL (ref 3.5–5.2)
Alkaline Phosphatase: 109 U/L (ref 39–117)
BUN: 11 mg/dL (ref 6–23)
CO2: 30 mEq/L (ref 19–32)
Calcium: 8.7 mg/dL (ref 8.4–10.5)
GFR, Est African American: 73 mL/min
GFR, Est Non African American: 63 mL/min
Glucose, Bld: 95 mg/dL (ref 70–99)
Potassium: 4 mEq/L (ref 3.5–5.3)

## 2012-09-12 LAB — CBC WITH DIFFERENTIAL/PLATELET
Basophils Absolute: 0 10*3/uL (ref 0.0–0.1)
Basophils Relative: 0 % (ref 0–1)
HCT: 43.2 % (ref 39.0–52.0)
Lymphocytes Relative: 40 % (ref 12–46)
MCHC: 35.2 g/dL (ref 30.0–36.0)
Neutro Abs: 3 10*3/uL (ref 1.7–7.7)
Neutrophils Relative %: 47 % (ref 43–77)
Platelets: 174 10*3/uL (ref 150–400)
RDW: 14.1 % (ref 11.5–15.5)
WBC: 6.2 10*3/uL (ref 4.0–10.5)
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
## 2012-09-13 LAB — T-HELPER CELL (CD4) - (RCID CLINIC ONLY): CD4 T Cell Abs: 820 uL (ref 400–2700)

## 2012-09-25 ENCOUNTER — Encounter (HOSPITAL_COMMUNITY): Payer: Self-pay | Admitting: Psychiatry

## 2012-09-25 ENCOUNTER — Ambulatory Visit: Payer: Self-pay

## 2012-09-25 ENCOUNTER — Ambulatory Visit (INDEPENDENT_AMBULATORY_CARE_PROVIDER_SITE_OTHER): Payer: Self-pay | Admitting: Psychiatry

## 2012-09-25 VITALS — BP 124/79 | HR 64 | Ht 69.0 in | Wt 145.2 lb

## 2012-09-25 DIAGNOSIS — F329 Major depressive disorder, single episode, unspecified: Secondary | ICD-10-CM

## 2012-09-25 DIAGNOSIS — F0630 Mood disorder due to known physiological condition, unspecified: Secondary | ICD-10-CM

## 2012-09-25 DIAGNOSIS — F39 Unspecified mood [affective] disorder: Secondary | ICD-10-CM
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
## 2012-10-10 ENCOUNTER — Other Ambulatory Visit: Payer: Self-pay | Admitting: Licensed Clinical Social Worker

## 2012-10-10 ENCOUNTER — Ambulatory Visit: Payer: Self-pay

## 2012-10-10 DIAGNOSIS — R110 Nausea: Secondary | ICD-10-CM

## 2012-10-10 DIAGNOSIS — F4310 Post-traumatic stress disorder, unspecified: Secondary | ICD-10-CM

## 2012-10-10 MED ORDER — ONDANSETRON HCL 4 MG PO TABS: 4.0000 mg | ORAL_TABLET | Freq: Three times a day (TID) | ORAL | Status: DC | PRN
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
## 2012-10-21 ENCOUNTER — Ambulatory Visit (INDEPENDENT_AMBULATORY_CARE_PROVIDER_SITE_OTHER): Payer: Self-pay | Admitting: Infectious Disease

## 2012-10-21 ENCOUNTER — Encounter: Payer: Self-pay | Admitting: Infectious Disease

## 2012-10-21 VITALS — BP 173/75 | HR 54 | Temp 98.2°F | Wt 145.0 lb

## 2012-10-21 DIAGNOSIS — F3189 Other bipolar disorder: Secondary | ICD-10-CM

## 2012-10-21 DIAGNOSIS — R197 Diarrhea, unspecified: Secondary | ICD-10-CM

## 2012-10-21 DIAGNOSIS — I10 Essential (primary) hypertension: Secondary | ICD-10-CM

## 2012-10-21 DIAGNOSIS — Z23 Encounter for immunization: Secondary | ICD-10-CM

## 2012-10-21 DIAGNOSIS — B20 Human immunodeficiency virus [HIV] disease: Secondary | ICD-10-CM

## 2012-10-21 DIAGNOSIS — F329 Major depressive disorder, single episode, unspecified: Secondary | ICD-10-CM

## 2012-10-21 DIAGNOSIS — F4310 Post-traumatic stress disorder, unspecified: Secondary | ICD-10-CM

## 2012-10-21 DIAGNOSIS — F3181 Bipolar II disorder: Secondary | ICD-10-CM

## 2012-10-21 MED ORDER — AMLODIPINE BESYLATE 5 MG PO TABS: 5.0000 mg | ORAL_TABLET | Freq: Every day | ORAL | Status: DC
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
## 2012-11-05 ENCOUNTER — Ambulatory Visit: Payer: Self-pay

## 2012-11-05 DIAGNOSIS — F330 Major depressive disorder, recurrent, mild: Secondary | ICD-10-CM

## 2012-11-05 DIAGNOSIS — F4310 Post-traumatic stress disorder, unspecified: Secondary | ICD-10-CM

## 2012-11-05 NOTE — Progress Notes (Signed)
Alexis Welch was in good spirits today but did report that his psychiatric medication has caused diarrhea.  He thinks it has been the Lexapro, but added that it stopped last week.  He had one bad episode of abrupt and strong negative thoughts a month ago, but he said he "fought it off" and it hasn't come back.  He reports he has been sleeping too much, but thinks that if he had something to do he may not sleep as much.  He did get an okay from his psychiatrist to cut his Depakote to one tablet (250 mg).  He has not been walking as much as before and I encouraged him to get back to this.  Plan to meet in 5 weeks.

## 2012-11-09 ENCOUNTER — Other Ambulatory Visit (HOSPITAL_COMMUNITY): Payer: Self-pay | Admitting: Psychiatry

## 2012-11-11 ENCOUNTER — Other Ambulatory Visit (HOSPITAL_COMMUNITY): Payer: Self-pay | Admitting: Psychiatry

## 2012-11-11 ENCOUNTER — Other Ambulatory Visit: Payer: Self-pay | Admitting: *Deleted

## 2012-11-11 DIAGNOSIS — F419 Anxiety disorder, unspecified: Secondary | ICD-10-CM

## 2012-11-11 MED ORDER — DIVALPROEX SODIUM ER 250 MG PO TB24: 250.0000 mg | ORAL_TABLET | Freq: Every day | ORAL | Status: DC

## 2012-11-11 MED ORDER — CLONAZEPAM 1 MG PO TABS: 1.0000 mg | ORAL_TABLET | Freq: Every evening | ORAL | Status: DC | PRN

## 2012-11-13 ENCOUNTER — Other Ambulatory Visit (HOSPITAL_COMMUNITY): Payer: Self-pay | Admitting: Psychiatry

## 2012-12-17 ENCOUNTER — Ambulatory Visit: Payer: Self-pay

## 2012-12-26 ENCOUNTER — Ambulatory Visit (HOSPITAL_COMMUNITY): Payer: Self-pay | Admitting: Psychiatry

## 2012-12-30 ENCOUNTER — Encounter (HOSPITAL_COMMUNITY): Payer: Self-pay | Admitting: Psychiatry

## 2012-12-30 ENCOUNTER — Ambulatory Visit (INDEPENDENT_AMBULATORY_CARE_PROVIDER_SITE_OTHER): Payer: Self-pay | Admitting: Psychiatry

## 2012-12-30 VITALS — BP 128/75 | HR 70 | Ht 69.0 in | Wt 148.0 lb

## 2012-12-30 DIAGNOSIS — F39 Unspecified mood [affective] disorder: Secondary | ICD-10-CM

## 2012-12-30 DIAGNOSIS — F0630 Mood disorder due to known physiological condition, unspecified: Secondary | ICD-10-CM

## 2012-12-30 DIAGNOSIS — F329 Major depressive disorder, single episode, unspecified: Secondary | ICD-10-CM

## 2012-12-30 MED ORDER — DIVALPROEX SODIUM ER 250 MG PO TB24: 250.0000 mg | ORAL_TABLET | Freq: Every day | ORAL | Status: DC

## 2012-12-30 MED ORDER — BUPROPION HCL ER (XL) 150 MG PO TB24: 150.0000 mg | ORAL_TABLET | ORAL | Status: DC
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
## 2013-01-20 ENCOUNTER — Other Ambulatory Visit: Payer: Self-pay

## 2013-01-28 ENCOUNTER — Ambulatory Visit: Payer: Self-pay

## 2013-01-28 DIAGNOSIS — F4310 Post-traumatic stress disorder, unspecified: Secondary | ICD-10-CM
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
## 2013-02-03 ENCOUNTER — Ambulatory Visit: Payer: Self-pay | Admitting: Infectious Disease

## 2013-02-07 ENCOUNTER — Other Ambulatory Visit (HOSPITAL_COMMUNITY): Payer: Self-pay | Admitting: Psychiatry

## 2013-02-07 DIAGNOSIS — F39 Unspecified mood [affective] disorder: Secondary | ICD-10-CM

## 2013-02-10 ENCOUNTER — Other Ambulatory Visit: Payer: Self-pay | Admitting: *Deleted

## 2013-02-10 DIAGNOSIS — R110 Nausea: Secondary | ICD-10-CM

## 2013-02-10 MED ORDER — ONDANSETRON HCL 4 MG PO TABS: 4.0000 mg | ORAL_TABLET | Freq: Three times a day (TID) | ORAL | Status: DC | PRN

## 2013-02-19 ENCOUNTER — Ambulatory Visit: Payer: Self-pay

## 2013-02-19 ENCOUNTER — Other Ambulatory Visit (INDEPENDENT_AMBULATORY_CARE_PROVIDER_SITE_OTHER): Payer: Self-pay

## 2013-02-19 DIAGNOSIS — I10 Essential (primary) hypertension: Secondary | ICD-10-CM

## 2013-02-19 DIAGNOSIS — B20 Human immunodeficiency virus [HIV] disease: Secondary | ICD-10-CM

## 2013-02-19 DIAGNOSIS — Z113 Encounter for screening for infections with a predominantly sexual mode of transmission: Secondary | ICD-10-CM

## 2013-02-19 LAB — COMPLETE METABOLIC PANEL WITH GFR
ALK PHOS: 127 U/L — AB (ref 39–117)
ALT: 17 U/L (ref 0–53)
AST: 21 U/L (ref 0–37)
Albumin: 4.2 g/dL (ref 3.5–5.2)
BUN: 14 mg/dL (ref 6–23)
CO2: 25 meq/L (ref 19–32)
Calcium: 9.2 mg/dL (ref 8.4–10.5)
Chloride: 104 mEq/L (ref 96–112)
Creat: 1.28 mg/dL (ref 0.50–1.35)
GFR, EST NON AFRICAN AMERICAN: 63 mL/min
GFR, Est African American: 73 mL/min
GLUCOSE: 90 mg/dL (ref 70–99)
POTASSIUM: 3.5 meq/L (ref 3.5–5.3)
SODIUM: 139 meq/L (ref 135–145)
TOTAL PROTEIN: 6.9 g/dL (ref 6.0–8.3)
Total Bilirubin: 0.6 mg/dL (ref 0.3–1.2)

## 2013-02-19 LAB — CBC WITH DIFFERENTIAL/PLATELET
BASOS ABS: 0 10*3/uL (ref 0.0–0.1)
Basophils Relative: 0 % (ref 0–1)
Eosinophils Absolute: 0.1 10*3/uL (ref 0.0–0.7)
Eosinophils Relative: 1 % (ref 0–5)
HCT: 45.4 % (ref 39.0–52.0)
HEMOGLOBIN: 16.4 g/dL (ref 13.0–17.0)
LYMPHS PCT: 39 % (ref 12–46)
Lymphs Abs: 2.6 10*3/uL (ref 0.7–4.0)
MCH: 31.3 pg (ref 26.0–34.0)
MCHC: 36.1 g/dL — ABNORMAL HIGH (ref 30.0–36.0)
MCV: 86.6 fL (ref 78.0–100.0)
Monocytes Absolute: 0.6 10*3/uL (ref 0.1–1.0)
Monocytes Relative: 9 % (ref 3–12)
NEUTROS ABS: 3.6 10*3/uL (ref 1.7–7.7)
Neutrophils Relative %: 51 % (ref 43–77)
Platelets: 188 10*3/uL (ref 150–400)
RBC: 5.24 MIL/uL (ref 4.22–5.81)
RDW: 14.1 % (ref 11.5–15.5)
WBC: 6.9 10*3/uL (ref 4.0–10.5)

## 2013-02-19 LAB — RPR

## 2013-02-20 LAB — MICROALBUMIN / CREATININE URINE RATIO
CREATININE, URINE: 36.1 mg/dL
MICROALB/CREAT RATIO: 46 mg/g — AB (ref 0.0–30.0)
Microalb, Ur: 1.66 mg/dL (ref 0.00–1.89)

## 2013-02-20 LAB — T-HELPER CELL (CD4) - (RCID CLINIC ONLY)
CD4 T CELL HELPER: 30 % — AB (ref 33–55)
CD4 T Cell Abs: 820 /uL (ref 400–2700)

## 2013-02-21 ENCOUNTER — Other Ambulatory Visit: Payer: Self-pay | Admitting: *Deleted

## 2013-02-21 DIAGNOSIS — B20 Human immunodeficiency virus [HIV] disease: Secondary | ICD-10-CM

## 2013-02-21 LAB — HIV-1 RNA QUANT-NO REFLEX-BLD
HIV 1 RNA Quant: <20 copies/mL (ref <20)
HIV-1 RNA Quant, Log: <1.3 {Log} (ref <1.30)

## 2013-02-21 MED ORDER — DOLUTEGRAVIR SODIUM 50 MG PO TABS: 50.0000 mg | ORAL_TABLET | Freq: Every day | ORAL | Status: DC

## 2013-02-21 MED ORDER — EMTRICITABINE-TENOFOVIR DF 200-300 MG PO TABS: 1.0000 | ORAL_TABLET | Freq: Every day | ORAL | Status: DC

## 2013-02-22 LAB — HLA B*5701: HLA-B 5701 W/RFLX HLA-B HIGH: NEGATIVE

## 2013-03-03 ENCOUNTER — Ambulatory Visit (INDEPENDENT_AMBULATORY_CARE_PROVIDER_SITE_OTHER): Payer: Self-pay | Admitting: Infectious Disease

## 2013-03-03 ENCOUNTER — Encounter: Payer: Self-pay | Admitting: Infectious Disease

## 2013-03-03 VITALS — BP 150/75 | HR 73 | Temp 98.0°F | Wt 142.0 lb

## 2013-03-03 DIAGNOSIS — F3289 Other specified depressive episodes: Secondary | ICD-10-CM

## 2013-03-03 DIAGNOSIS — B20 Human immunodeficiency virus [HIV] disease: Secondary | ICD-10-CM

## 2013-03-03 DIAGNOSIS — F4310 Post-traumatic stress disorder, unspecified: Secondary | ICD-10-CM

## 2013-03-03 DIAGNOSIS — F329 Major depressive disorder, single episode, unspecified: Secondary | ICD-10-CM

## 2013-03-03 DIAGNOSIS — I10 Essential (primary) hypertension: Secondary | ICD-10-CM

## 2013-03-03 DIAGNOSIS — F32A Depression, unspecified: Secondary | ICD-10-CM

## 2013-03-03 DIAGNOSIS — A499 Bacterial infection, unspecified: Secondary | ICD-10-CM

## 2013-03-03 DIAGNOSIS — J329 Chronic sinusitis, unspecified: Secondary | ICD-10-CM

## 2013-03-03 DIAGNOSIS — B9689 Other specified bacterial agents as the cause of diseases classified elsewhere: Secondary | ICD-10-CM

## 2013-03-03 MED ORDER — ABACAVIR-DOLUTEGRAVIR-LAMIVUD 600-50-300 MG PO TABS: 1.0000 | ORAL_TABLET | Freq: Every day | ORAL | Status: DC

## 2013-03-03 MED ORDER — AMOXICILLIN-POT CLAVULANATE 875-125 MG PO TABS: 1.0000 | ORAL_TABLET | Freq: Two times a day (BID) | ORAL | Status: DC
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
## 2013-03-27 ENCOUNTER — Ambulatory Visit: Payer: Self-pay

## 2013-03-27 ENCOUNTER — Other Ambulatory Visit (INDEPENDENT_AMBULATORY_CARE_PROVIDER_SITE_OTHER): Payer: Self-pay

## 2013-03-27 DIAGNOSIS — B20 Human immunodeficiency virus [HIV] disease: Secondary | ICD-10-CM

## 2013-03-27 DIAGNOSIS — F4310 Post-traumatic stress disorder, unspecified: Secondary | ICD-10-CM

## 2013-03-27 DIAGNOSIS — J329 Chronic sinusitis, unspecified: Principal | ICD-10-CM

## 2013-03-27 DIAGNOSIS — F330 Major depressive disorder, recurrent, mild: Secondary | ICD-10-CM

## 2013-03-27 DIAGNOSIS — Z79899 Other long term (current) drug therapy: Secondary | ICD-10-CM

## 2013-03-27 DIAGNOSIS — B9689 Other specified bacterial agents as the cause of diseases classified elsewhere: Secondary | ICD-10-CM

## 2013-03-27 LAB — CBC WITH DIFFERENTIAL/PLATELET
Basophils Absolute: 0 10*3/uL (ref 0.0–0.1)
Basophils Relative: 0 % (ref 0–1)
EOS PCT: 2 % (ref 0–5)
Eosinophils Absolute: 0.1 10*3/uL (ref 0.0–0.7)
HCT: 44 % (ref 39.0–52.0)
HEMOGLOBIN: 15.5 g/dL (ref 13.0–17.0)
LYMPHS ABS: 2.1 10*3/uL (ref 0.7–4.0)
Lymphocytes Relative: 46 % (ref 12–46)
MCH: 31.2 pg (ref 26.0–34.0)
MCHC: 35.2 g/dL (ref 30.0–36.0)
MCV: 88.5 fL (ref 78.0–100.0)
Monocytes Absolute: 0.5 10*3/uL (ref 0.1–1.0)
Monocytes Relative: 10 % (ref 3–12)
NEUTROS PCT: 42 % — AB (ref 43–77)
Neutro Abs: 1.9 10*3/uL (ref 1.7–7.7)
Platelets: 194 10*3/uL (ref 150–400)
RBC: 4.97 MIL/uL (ref 4.22–5.81)
RDW: 14.4 % (ref 11.5–15.5)
WBC: 4.5 10*3/uL (ref 4.0–10.5)

## 2013-03-27 LAB — COMPLETE METABOLIC PANEL WITH GFR
ALT: 17 U/L (ref 0–53)
AST: 16 U/L (ref 0–37)
Albumin: 4.2 g/dL (ref 3.5–5.2)
Alkaline Phosphatase: 114 U/L (ref 39–117)
BUN: 8 mg/dL (ref 6–23)
CALCIUM: 9.4 mg/dL (ref 8.4–10.5)
CO2: 27 meq/L (ref 19–32)
Chloride: 105 mEq/L (ref 96–112)
Creat: 1.16 mg/dL (ref 0.50–1.35)
GFR, EST AFRICAN AMERICAN: 82 mL/min
GFR, Est Non African American: 71 mL/min
Glucose, Bld: 68 mg/dL — ABNORMAL LOW (ref 70–99)
Potassium: 3.9 mEq/L (ref 3.5–5.3)
SODIUM: 141 meq/L (ref 135–145)
TOTAL PROTEIN: 6.6 g/dL (ref 6.0–8.3)
Total Bilirubin: 0.7 mg/dL (ref 0.2–1.2)

## 2013-03-27 LAB — LIPID PANEL
Cholesterol: 225 mg/dL — ABNORMAL HIGH (ref 0–200)
HDL: 52 mg/dL (ref >39)
LDL CALC: 149 mg/dL — AB (ref 0–99)
Total CHOL/HDL Ratio: 4.3 Ratio
Triglycerides: 121 mg/dL (ref <150)
VLDL: 24 mg/dL (ref 0–40)
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
## 2013-03-28 LAB — T-HELPER CELL (CD4) - (RCID CLINIC ONLY)
CD4 T CELL ABS: 680 /uL (ref 400–2700)
CD4 T CELL HELPER: 30 % — AB (ref 33–55)

## 2013-03-31 ENCOUNTER — Telehealth: Payer: Self-pay | Admitting: *Deleted

## 2013-03-31 DIAGNOSIS — J329 Chronic sinusitis, unspecified: Principal | ICD-10-CM

## 2013-03-31 DIAGNOSIS — B9689 Other specified bacterial agents as the cause of diseases classified elsewhere: Secondary | ICD-10-CM

## 2013-03-31 MED ORDER — ABACAVIR-DOLUTEGRAVIR-LAMIVUD 600-50-300 MG PO TABS: 1.0000 | ORAL_TABLET | Freq: Every day | ORAL | Status: DC

## 2013-03-31 NOTE — Telephone Encounter (Signed)
AFAP approved

## 2013-04-01 ENCOUNTER — Ambulatory Visit (HOSPITAL_COMMUNITY): Payer: Self-pay | Admitting: Psychiatry

## 2013-04-03 LAB — HIV-1 RNA QUANT-NO REFLEX-BLD
HIV 1 RNA Quant: <20 copies/mL (ref <20)
HIV-1 RNA Quant, Log: <1.3 {Log} (ref <1.30)

## 2013-04-18 ENCOUNTER — Encounter (HOSPITAL_COMMUNITY): Payer: Self-pay | Admitting: Psychiatry

## 2013-04-18 ENCOUNTER — Ambulatory Visit (INDEPENDENT_AMBULATORY_CARE_PROVIDER_SITE_OTHER): Payer: Self-pay | Admitting: Psychiatry

## 2013-04-18 DIAGNOSIS — F329 Major depressive disorder, single episode, unspecified: Secondary | ICD-10-CM

## 2013-04-18 DIAGNOSIS — F39 Unspecified mood [affective] disorder: Secondary | ICD-10-CM

## 2013-04-18 DIAGNOSIS — F3289 Other specified depressive episodes: Secondary | ICD-10-CM

## 2013-04-18 DIAGNOSIS — F0630 Mood disorder due to known physiological condition, unspecified: Secondary | ICD-10-CM

## 2013-04-18 MED ORDER — BUPROPION HCL ER (XL) 150 MG PO TB24: 150.0000 mg | ORAL_TABLET | ORAL | Status: DC

## 2013-04-18 MED ORDER — ESCITALOPRAM OXALATE 10 MG PO TABS: 10.0000 mg | ORAL_TABLET | Freq: Every day | ORAL | Status: DC
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
## 2013-04-21 ENCOUNTER — Telehealth (HOSPITAL_COMMUNITY): Payer: Self-pay | Admitting: *Deleted

## 2013-04-21 NOTE — Telephone Encounter (Signed)
VM left 3/5 @1319 :VM recv'd 3/9 @ 0846:Is Lexapro discontinued? Is patient now on Wellbutrin only or both? Contacted pharmacy 3/9 @ [phone]: Per MD note, patient is to be on both medications

## 2013-04-24 ENCOUNTER — Telehealth: Payer: Self-pay | Admitting: *Deleted

## 2013-04-24 NOTE — Telephone Encounter (Signed)
Patient called regarding his disability form that was sent to Dr. Tommy Medal. Apparently it was sent twice and his insurance company, [employer] is stating they have not received it. Advised patient I will check with Dr. Minette Brine CMA, [place] and call him back at [phone]. Myrtis Hopping

## 2013-04-25 NOTE — Telephone Encounter (Signed)
I received it and sent it what was requested.

## 2013-05-01 ENCOUNTER — Other Ambulatory Visit: Payer: Self-pay | Admitting: *Deleted

## 2013-05-01 DIAGNOSIS — F419 Anxiety disorder, unspecified: Secondary | ICD-10-CM

## 2013-05-01 MED ORDER — CLONAZEPAM 1 MG PO TABS: 1.0000 mg | ORAL_TABLET | Freq: Every evening | ORAL | Status: DC | PRN

## 2013-05-06 ENCOUNTER — Ambulatory Visit: Payer: Self-pay

## 2013-05-06 DIAGNOSIS — F4310 Post-traumatic stress disorder, unspecified: Secondary | ICD-10-CM
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
## 2013-05-13 ENCOUNTER — Ambulatory Visit: Payer: Self-pay

## 2013-05-13 NOTE — Progress Notes (Signed)
Alexis Welch reports that he continues to sleep 14-16 hours a day most days.  We discussed his medications and as far as psychiatric meds, he is only taking Wellbutrin right now.  He feels like he needs something else to "pick him up".  He continues to deal with the stress of whether he is going to be approved for disability or not.  He also is having issues with his mortgage company re: back payments on his mortgage and the fees associated with that.  He said he also notices that he is easily annoyed with people.  He did report that his oldest sister is now staying in a clean trailer (she has schizophrenia) and no longer in a filthy one with cat feces, etc.  He was able to laugh some during the session, but then noted that he doesn't talk to anyone about all this stress until he comes to his sessions here.  Plan to meet in one week.

## 2013-05-20 ENCOUNTER — Ambulatory Visit: Payer: Self-pay

## 2013-05-20 DIAGNOSIS — F4310 Post-traumatic stress disorder, unspecified: Secondary | ICD-10-CM
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
## 2013-06-10 ENCOUNTER — Ambulatory Visit: Payer: Self-pay

## 2013-06-10 NOTE — Progress Notes (Signed)
Alexis Welch reported that he continues to sleep 14-16 hours a day, with no energy to get up and do things most days.  He talked about his sisters (who are mentally ill) and his older brother, who lives in [place] and has repaired his relationship with Alexis Welch.  He talked about going to a psychiatric evaluation for Disability and that it went very well.  He said the psychiatrist was very kind and sympathetic to him.  I provided some psycho-education on mindfulness after he talked about being plagued with racing thoughts.  I gave him some online links to guided meditations, which he said he would check out.

## 2013-06-17 ENCOUNTER — Ambulatory Visit: Payer: Self-pay

## 2013-06-24 ENCOUNTER — Ambulatory Visit: Payer: Self-pay

## 2013-07-01 ENCOUNTER — Ambulatory Visit: Payer: Self-pay

## 2013-07-01 DIAGNOSIS — F4310 Post-traumatic stress disorder, unspecified: Secondary | ICD-10-CM

## 2013-07-01 NOTE — Progress Notes (Signed)
Alexis Welch reported that he continues to oversleep, which he attributes to his psychiatric medication.  He also reported that his 55 year old dog died last week, which is why he cancelled his appointment.  He buried her in the back yard, but seems to be doing okay with this loss.  He also continues to struggle with the bank over his house, which he fears he will lose.  He said he has trouble concentrating on things and cannot remember things very well.  As an example he said he watched a TV show last night, but can't remember what happened on it.  He was able to laugh some today and overall he says he feels like he is doing okay.  Plan to meet in one week.

## 2013-07-08 ENCOUNTER — Ambulatory Visit: Payer: Self-pay

## 2013-07-08 ENCOUNTER — Other Ambulatory Visit (HOSPITAL_COMMUNITY): Payer: Self-pay | Admitting: Psychiatry

## 2013-07-08 DIAGNOSIS — F4310 Post-traumatic stress disorder, unspecified: Secondary | ICD-10-CM
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
## 2013-07-08 NOTE — Telephone Encounter (Signed)
Patient has appointment on 07/21/13. Will provide script on his appointment.

## 2013-07-15 ENCOUNTER — Other Ambulatory Visit: Payer: Self-pay | Admitting: Licensed Clinical Social Worker

## 2013-07-15 ENCOUNTER — Telehealth: Payer: Self-pay | Admitting: *Deleted

## 2013-07-15 DIAGNOSIS — F329 Major depressive disorder, single episode, unspecified: Secondary | ICD-10-CM

## 2013-07-15 DIAGNOSIS — F3289 Other specified depressive episodes: Secondary | ICD-10-CM

## 2013-07-15 MED ORDER — ESCITALOPRAM OXALATE 10 MG PO TABS: 10.0000 mg | ORAL_TABLET | Freq: Every day | ORAL | Status: DC

## 2013-07-15 NOTE — Telephone Encounter (Signed)
Fine to refill it 

## 2013-07-15 NOTE — Telephone Encounter (Signed)
Call from [facility] pharmacist, patient is requesting that Dr. Tommy Medal refill his lexapro. Previously refilled by [name] at [facility], but patient is having difficulty obtaining his refill, please advise.  Myrtis Hopping

## 2013-07-16 ENCOUNTER — Other Ambulatory Visit: Payer: Self-pay | Admitting: *Deleted

## 2013-07-16 DIAGNOSIS — F329 Major depressive disorder, single episode, unspecified: Secondary | ICD-10-CM

## 2013-07-16 DIAGNOSIS — F3289 Other specified depressive episodes: Secondary | ICD-10-CM

## 2013-07-16 MED ORDER — ESCITALOPRAM OXALATE 10 MG PO TABS: 10.0000 mg | ORAL_TABLET | Freq: Every day | ORAL | Status: DC

## 2013-07-16 NOTE — Telephone Encounter (Signed)
Patient notified and Rx sent to [facility].

## 2013-07-17 ENCOUNTER — Ambulatory Visit: Payer: Self-pay

## 2013-07-17 DIAGNOSIS — F4310 Post-traumatic stress disorder, unspecified: Secondary | ICD-10-CM
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
## 2013-07-21 ENCOUNTER — Encounter (HOSPITAL_COMMUNITY): Payer: Self-pay | Admitting: Psychiatry

## 2013-07-21 ENCOUNTER — Ambulatory Visit (INDEPENDENT_AMBULATORY_CARE_PROVIDER_SITE_OTHER): Payer: Self-pay | Admitting: Psychiatry

## 2013-07-21 VITALS — BP 105/73 | HR 68 | Ht 69.0 in | Wt 143.0 lb

## 2013-07-21 DIAGNOSIS — F3289 Other specified depressive episodes: Secondary | ICD-10-CM

## 2013-07-21 DIAGNOSIS — F419 Anxiety disorder, unspecified: Secondary | ICD-10-CM

## 2013-07-21 DIAGNOSIS — F329 Major depressive disorder, single episode, unspecified: Secondary | ICD-10-CM

## 2013-07-21 DIAGNOSIS — F0630 Mood disorder due to known physiological condition, unspecified: Secondary | ICD-10-CM

## 2013-07-21 MED ORDER — DIVALPROEX SODIUM ER 250 MG PO TB24: ORAL_TABLET | ORAL | Status: DC

## 2013-07-21 MED ORDER — BUPROPION HCL ER (XL) 150 MG PO TB24: 150.0000 mg | ORAL_TABLET | ORAL | Status: DC

## 2013-07-21 MED ORDER — CLONAZEPAM 1 MG PO TABS: 1.0000 mg | ORAL_TABLET | Freq: Every evening | ORAL | Status: DC | PRN
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
## 2013-08-04 ENCOUNTER — Other Ambulatory Visit (HOSPITAL_COMMUNITY): Payer: Self-pay | Admitting: Psychiatry

## 2013-08-04 NOTE — Telephone Encounter (Signed)
Given on 07/21/13 with 2 refills. Too soon to refill

## 2013-08-19 ENCOUNTER — Other Ambulatory Visit (INDEPENDENT_AMBULATORY_CARE_PROVIDER_SITE_OTHER): Payer: Self-pay

## 2013-08-19 ENCOUNTER — Ambulatory Visit: Payer: Self-pay

## 2013-08-19 DIAGNOSIS — B20 Human immunodeficiency virus [HIV] disease: Secondary | ICD-10-CM

## 2013-08-19 DIAGNOSIS — F4310 Post-traumatic stress disorder, unspecified: Secondary | ICD-10-CM

## 2013-08-19 DIAGNOSIS — B9689 Other specified bacterial agents as the cause of diseases classified elsewhere: Secondary | ICD-10-CM

## 2013-08-19 DIAGNOSIS — Z113 Encounter for screening for infections with a predominantly sexual mode of transmission: Secondary | ICD-10-CM

## 2013-08-19 DIAGNOSIS — F331 Major depressive disorder, recurrent, moderate: Secondary | ICD-10-CM

## 2013-08-19 DIAGNOSIS — J329 Chronic sinusitis, unspecified: Secondary | ICD-10-CM

## 2013-08-19 LAB — CBC WITH DIFFERENTIAL/PLATELET
BASOS ABS: 0 10*3/uL (ref 0.0–0.1)
Basophils Relative: 0 % (ref 0–1)
Eosinophils Absolute: 0.1 10*3/uL (ref 0.0–0.7)
Eosinophils Relative: 1 % (ref 0–5)
HEMATOCRIT: 45.4 % (ref 39.0–52.0)
Hemoglobin: 16.2 g/dL (ref 13.0–17.0)
LYMPHS PCT: 40 % (ref 12–46)
Lymphs Abs: 2.6 10*3/uL (ref 0.7–4.0)
MCH: 32.1 pg (ref 26.0–34.0)
MCHC: 35.7 g/dL (ref 30.0–36.0)
MCV: 90.1 fL (ref 78.0–100.0)
Monocytes Absolute: 0.8 10*3/uL (ref 0.1–1.0)
Monocytes Relative: 12 % (ref 3–12)
NEUTROS ABS: 3.1 10*3/uL (ref 1.7–7.7)
Neutrophils Relative %: 47 % (ref 43–77)
PLATELETS: 178 10*3/uL (ref 150–400)
RBC: 5.04 MIL/uL (ref 4.22–5.81)
RDW: 14.2 % (ref 11.5–15.5)
WBC: 6.6 10*3/uL (ref 4.0–10.5)

## 2013-08-19 LAB — COMPREHENSIVE METABOLIC PANEL
ALBUMIN: 4.3 g/dL (ref 3.5–5.2)
ALT: 9 U/L (ref 0–53)
AST: 13 U/L (ref 0–37)
Alkaline Phosphatase: 89 U/L (ref 39–117)
BUN: 12 mg/dL (ref 6–23)
CHLORIDE: 102 meq/L (ref 96–112)
CO2: 28 mEq/L (ref 19–32)
Calcium: 9.3 mg/dL (ref 8.4–10.5)
Creat: 1.43 mg/dL — ABNORMAL HIGH (ref 0.50–1.35)
Glucose, Bld: 78 mg/dL (ref 70–99)
POTASSIUM: 3.9 meq/L (ref 3.5–5.3)
Sodium: 140 mEq/L (ref 135–145)
Total Bilirubin: 0.7 mg/dL (ref 0.2–1.2)
Total Protein: 6.9 g/dL (ref 6.0–8.3)

## 2013-08-19 LAB — RPR

## 2013-08-19 NOTE — Progress Notes (Signed)
Benaiah talked about being denied Disability and brought in the paperwork from [employer].  He is upset about this but also talked about the possibility of losing his house here in [place], which he has been renting to cover the mortgage.  He is fearful that he will lose it and says he cannot face this because it represents his "whole life".  He had planned to retire one day and have it paid off.  He was able to laugh some and even made a joke to my next appointment that he was the reason for [place] being a few minutes late.  But he was tearful at times during the session and seems completely distraught over what is going to happen to him.  He appears to be very depressed.

## 2013-08-20 LAB — T-HELPER CELL (CD4) - (RCID CLINIC ONLY)
CD4 T CELL HELPER: 31 % — AB (ref 33–55)
CD4 T Cell Abs: 1060 /uL (ref 400–2700)

## 2013-08-21 LAB — HIV-1 RNA QUANT-NO REFLEX-BLD
HIV 1 RNA Quant: <20 copies/mL (ref <20)
HIV-1 RNA Quant, Log: <1.3 {Log} (ref <1.30)

## 2013-09-01 ENCOUNTER — Encounter: Payer: Self-pay | Admitting: Infectious Disease

## 2013-09-01 ENCOUNTER — Ambulatory Visit (INDEPENDENT_AMBULATORY_CARE_PROVIDER_SITE_OTHER): Payer: Self-pay | Admitting: Infectious Disease

## 2013-09-01 VITALS — BP 135/85 | HR 50 | Temp 97.9°F | Wt 141.2 lb

## 2013-09-01 DIAGNOSIS — B20 Human immunodeficiency virus [HIV] disease: Secondary | ICD-10-CM

## 2013-09-01 DIAGNOSIS — M542 Cervicalgia: Secondary | ICD-10-CM

## 2013-09-01 DIAGNOSIS — F3162 Bipolar disorder, current episode mixed, moderate: Secondary | ICD-10-CM

## 2013-09-01 LAB — COMPLETE METABOLIC PANEL WITH GFR
ALBUMIN: 4.2 g/dL (ref 3.5–5.2)
ALT: 11 U/L (ref 0–53)
AST: 14 U/L (ref 0–37)
Alkaline Phosphatase: 74 U/L (ref 39–117)
BILIRUBIN TOTAL: 0.8 mg/dL (ref 0.2–1.2)
BUN: 11 mg/dL (ref 6–23)
CALCIUM: 8.8 mg/dL (ref 8.4–10.5)
CHLORIDE: 104 meq/L (ref 96–112)
CO2: 28 meq/L (ref 19–32)
Creat: 1.18 mg/dL (ref 0.50–1.35)
GFR, EST AFRICAN AMERICAN: 80 mL/min
GFR, Est Non African American: 70 mL/min
GLUCOSE: 91 mg/dL (ref 70–99)
Potassium: 3.4 mEq/L — ABNORMAL LOW (ref 3.5–5.3)
SODIUM: 140 meq/L (ref 135–145)
TOTAL PROTEIN: 6.6 g/dL (ref 6.0–8.3)

## 2013-09-01 NOTE — Addendum Note (Signed)
Addended by: [name] on: 09/01/2013 03:56 PM   Modules accepted: Level of Service
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
## 2013-09-01 NOTE — Patient Instructions (Signed)
Great job taking care of yourself  We need to get some labs today but otherwise rtc in 4 months  Make sure you renew ADAP
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
## 2013-09-09 ENCOUNTER — Ambulatory Visit: Payer: Self-pay

## 2013-09-15 ENCOUNTER — Other Ambulatory Visit: Payer: Self-pay | Admitting: *Deleted

## 2013-09-15 DIAGNOSIS — B9689 Other specified bacterial agents as the cause of diseases classified elsewhere: Secondary | ICD-10-CM

## 2013-09-15 DIAGNOSIS — J329 Chronic sinusitis, unspecified: Principal | ICD-10-CM

## 2013-09-15 MED ORDER — ABACAVIR-DOLUTEGRAVIR-LAMIVUD 600-50-300 MG PO TABS: 1.0000 | ORAL_TABLET | Freq: Every day | ORAL | Status: DC

## 2013-09-16 ENCOUNTER — Ambulatory Visit: Payer: Self-pay

## 2013-10-06 ENCOUNTER — Ambulatory Visit: Payer: Self-pay

## 2013-10-14 ENCOUNTER — Ambulatory Visit: Payer: Self-pay

## 2013-10-22 ENCOUNTER — Encounter (HOSPITAL_COMMUNITY): Payer: Self-pay | Admitting: Psychiatry

## 2013-10-22 ENCOUNTER — Telehealth (HOSPITAL_COMMUNITY): Payer: Self-pay | Admitting: *Deleted

## 2013-10-22 ENCOUNTER — Ambulatory Visit (INDEPENDENT_AMBULATORY_CARE_PROVIDER_SITE_OTHER): Payer: Self-pay | Admitting: Psychiatry

## 2013-10-22 VITALS — BP 149/90 | HR 56 | Ht 69.0 in | Wt 135.0 lb

## 2013-10-22 DIAGNOSIS — F329 Major depressive disorder, single episode, unspecified: Secondary | ICD-10-CM

## 2013-10-22 DIAGNOSIS — F0630 Mood disorder due to known physiological condition, unspecified: Secondary | ICD-10-CM

## 2013-10-22 DIAGNOSIS — F419 Anxiety disorder, unspecified: Secondary | ICD-10-CM

## 2013-10-22 DIAGNOSIS — F319 Bipolar disorder, unspecified: Secondary | ICD-10-CM

## 2013-10-22 DIAGNOSIS — F3289 Other specified depressive episodes: Secondary | ICD-10-CM

## 2013-10-22 MED ORDER — QUETIAPINE FUMARATE 100 MG PO TABS: ORAL_TABLET | ORAL | Status: DC

## 2013-10-22 MED ORDER — BUPROPION HCL ER (XL) 150 MG PO TB24: 150.0000 mg | ORAL_TABLET | ORAL | Status: DC

## 2013-10-22 MED ORDER — CLONAZEPAM 1 MG PO TABS: 1.0000 mg | ORAL_TABLET | Freq: Every evening | ORAL | Status: DC | PRN

## 2013-10-22 MED ORDER — DIVALPROEX SODIUM ER 250 MG PO TB24: ORAL_TABLET | ORAL | Status: DC
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
## 2013-10-22 NOTE — Telephone Encounter (Signed)
Came to office.States medication program he uses, [facility] does not cover Seroquel. Advised patient will contact pharmcy/[facility] and find out what his options are.  Contacted [facility], per pharmacy, Seroquel not on [facility] formulary. They gave # for [employer]-- [phone].

## 2013-10-23 ENCOUNTER — Ambulatory Visit: Payer: Self-pay

## 2013-10-23 MED ORDER — OLANZAPINE 2.5 MG PO TABS: 2.5000 mg | ORAL_TABLET | Freq: Every day | ORAL | Status: DC

## 2013-10-23 NOTE — Addendum Note (Signed)
Addended by: Rolland Bimler on: 10/23/2013 05:08 PM   Modules accepted: Orders, Medications

## 2013-10-23 NOTE — Telephone Encounter (Signed)
9/9/15Hulen Skains [phone] [facility]) - sent to [employer] [phone] -- given # for [place] [place] [facility] [phone] (also [place]) 10/23/13: Called [phone] - [place] [facility] (also [place]) to obtain coverage/alternate for Seroquel 100 mg - 1/2 to 1 tablet at bedtime. Per Delsa Sale, Pharmacist, Seroquel is not on formulary. Similar medications on formulary are Buspar, Risperdal and Zyprexa.

## 2013-10-23 NOTE — Telephone Encounter (Signed)
Reviewed information with Dr. Doyne Keel - she ordered Zyprexa 2.5 mg at bedtime - #30/ 1 refill

## 2013-11-13 ENCOUNTER — Other Ambulatory Visit: Payer: Self-pay | Admitting: *Deleted

## 2013-11-13 DIAGNOSIS — I10 Essential (primary) hypertension: Secondary | ICD-10-CM

## 2013-11-13 MED ORDER — AMLODIPINE BESYLATE 5 MG PO TABS: 5.0000 mg | ORAL_TABLET | Freq: Every day | ORAL | Status: DC

## 2013-11-17 ENCOUNTER — Ambulatory Visit (INDEPENDENT_AMBULATORY_CARE_PROVIDER_SITE_OTHER): Payer: Self-pay | Admitting: Psychiatry

## 2013-11-17 ENCOUNTER — Encounter (HOSPITAL_COMMUNITY): Payer: Self-pay | Admitting: Psychiatry

## 2013-11-17 VITALS — BP 136/74 | HR 75 | Ht 69.0 in | Wt 149.4 lb

## 2013-11-17 DIAGNOSIS — F419 Anxiety disorder, unspecified: Secondary | ICD-10-CM

## 2013-11-17 DIAGNOSIS — F319 Bipolar disorder, unspecified: Secondary | ICD-10-CM

## 2013-11-17 DIAGNOSIS — F0630 Mood disorder due to known physiological condition, unspecified: Secondary | ICD-10-CM

## 2013-11-17 DIAGNOSIS — F329 Major depressive disorder, single episode, unspecified: Secondary | ICD-10-CM

## 2013-11-17 MED ORDER — CLONAZEPAM 1 MG PO TABS: 1.0000 mg | ORAL_TABLET | Freq: Every evening | ORAL | Status: DC | PRN

## 2013-11-17 MED ORDER — OLANZAPINE 5 MG PO TABS: 5.0000 mg | ORAL_TABLET | Freq: Every day | ORAL | Status: DC

## 2013-11-17 MED ORDER — BUPROPION HCL ER (XL) 150 MG PO TB24: 150.0000 mg | ORAL_TABLET | ORAL | Status: DC

## 2013-11-17 MED ORDER — DIVALPROEX SODIUM ER 500 MG PO TB24: 500.0000 mg | ORAL_TABLET | Freq: Every day | ORAL | Status: DC
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
## 2013-11-20 ENCOUNTER — Ambulatory Visit: Payer: Self-pay

## 2013-11-28 ENCOUNTER — Other Ambulatory Visit: Payer: Self-pay

## 2013-12-07 ENCOUNTER — Other Ambulatory Visit (HOSPITAL_COMMUNITY): Payer: Self-pay | Admitting: Psychiatry

## 2013-12-17 ENCOUNTER — Other Ambulatory Visit: Payer: Self-pay

## 2013-12-18 ENCOUNTER — Ambulatory Visit (INDEPENDENT_AMBULATORY_CARE_PROVIDER_SITE_OTHER): Payer: Self-pay | Admitting: Psychiatry

## 2013-12-18 ENCOUNTER — Other Ambulatory Visit (INDEPENDENT_AMBULATORY_CARE_PROVIDER_SITE_OTHER): Payer: Self-pay

## 2013-12-18 ENCOUNTER — Encounter (HOSPITAL_COMMUNITY): Payer: Self-pay | Admitting: Psychiatry

## 2013-12-18 VITALS — BP 144/68 | HR 84 | Ht 69.0 in | Wt 160.6 lb

## 2013-12-18 DIAGNOSIS — F419 Anxiety disorder, unspecified: Secondary | ICD-10-CM

## 2013-12-18 DIAGNOSIS — B20 Human immunodeficiency virus [HIV] disease: Secondary | ICD-10-CM

## 2013-12-18 DIAGNOSIS — Z79899 Other long term (current) drug therapy: Secondary | ICD-10-CM

## 2013-12-18 DIAGNOSIS — F0630 Mood disorder due to known physiological condition, unspecified: Secondary | ICD-10-CM

## 2013-12-18 DIAGNOSIS — F319 Bipolar disorder, unspecified: Secondary | ICD-10-CM

## 2013-12-18 DIAGNOSIS — F329 Major depressive disorder, single episode, unspecified: Secondary | ICD-10-CM

## 2013-12-18 LAB — CBC WITH DIFFERENTIAL/PLATELET
Basophils Absolute: 0 10*3/uL (ref 0.0–0.1)
Basophils Relative: 0 % (ref 0–1)
EOS ABS: 0.1 10*3/uL (ref 0.0–0.7)
Eosinophils Relative: 1 % (ref 0–5)
HCT: 44.1 % (ref 39.0–52.0)
Hemoglobin: 15.4 g/dL (ref 13.0–17.0)
Lymphocytes Relative: 38 % (ref 12–46)
Lymphs Abs: 2.8 10*3/uL (ref 0.7–4.0)
MCH: 32 pg (ref 26.0–34.0)
MCHC: 34.9 g/dL (ref 30.0–36.0)
MCV: 91.5 fL (ref 78.0–100.0)
Monocytes Absolute: 0.7 10*3/uL (ref 0.1–1.0)
Monocytes Relative: 10 % (ref 3–12)
Neutro Abs: 3.8 10*3/uL (ref 1.7–7.7)
Neutrophils Relative %: 51 % (ref 43–77)
PLATELETS: 189 10*3/uL (ref 150–400)
RBC: 4.82 MIL/uL (ref 4.22–5.81)
RDW: 13.8 % (ref 11.5–15.5)
WBC: 7.4 10*3/uL (ref 4.0–10.5)

## 2013-12-18 LAB — COMPLETE METABOLIC PANEL WITH GFR
ALT: 36 U/L (ref 0–53)
AST: 22 U/L (ref 0–37)
Albumin: 3.8 g/dL (ref 3.5–5.2)
Alkaline Phosphatase: 77 U/L (ref 39–117)
BILIRUBIN TOTAL: 0.5 mg/dL (ref 0.2–1.2)
BUN: 14 mg/dL (ref 6–23)
CO2: 25 mEq/L (ref 19–32)
CREATININE: 1.17 mg/dL (ref 0.50–1.35)
Calcium: 9.1 mg/dL (ref 8.4–10.5)
Chloride: 105 mEq/L (ref 96–112)
GFR, EST AFRICAN AMERICAN: 81 mL/min
GFR, Est Non African American: 70 mL/min
Glucose, Bld: 93 mg/dL (ref 70–99)
Potassium: 3.5 mEq/L (ref 3.5–5.3)
Sodium: 138 mEq/L (ref 135–145)
Total Protein: 6.7 g/dL (ref 6.0–8.3)

## 2013-12-18 LAB — LIPID PANEL
Cholesterol: 213 mg/dL — ABNORMAL HIGH (ref 0–200)
HDL: 54 mg/dL (ref >39)
LDL CALC: 130 mg/dL — AB (ref 0–99)
TRIGLYCERIDES: 144 mg/dL (ref <150)
Total CHOL/HDL Ratio: 3.9 Ratio
VLDL: 29 mg/dL (ref 0–40)

## 2013-12-18 MED ORDER — CLONAZEPAM 1 MG PO TABS: 1.0000 mg | ORAL_TABLET | Freq: Every evening | ORAL | Status: DC | PRN

## 2013-12-18 MED ORDER — BUPROPION HCL ER (XL) 150 MG PO TB24: 150.0000 mg | ORAL_TABLET | ORAL | Status: DC

## 2013-12-18 MED ORDER — OLANZAPINE 5 MG PO TABS: 5.0000 mg | ORAL_TABLET | Freq: Every day | ORAL | Status: DC

## 2013-12-18 MED ORDER — DIVALPROEX SODIUM ER 500 MG PO TB24: 500.0000 mg | ORAL_TABLET | Freq: Every day | ORAL | Status: DC
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
## 2013-12-19 LAB — T-HELPER CELL (CD4) - (RCID CLINIC ONLY)
CD4 % Helper T Cell: 31 % — ABNORMAL LOW (ref 33–55)
CD4 T Cell Abs: 810 /uL (ref 400–2700)

## 2013-12-22 LAB — HIV-1 RNA QUANT-NO REFLEX-BLD: HIV-1 RNA Quant, Log: <1.3 {Log} (ref <1.30)

## 2013-12-31 ENCOUNTER — Ambulatory Visit (INDEPENDENT_AMBULATORY_CARE_PROVIDER_SITE_OTHER): Payer: Self-pay | Admitting: Infectious Disease

## 2013-12-31 ENCOUNTER — Encounter: Payer: Self-pay | Admitting: Infectious Disease

## 2013-12-31 VITALS — BP 148/88 | HR 79 | Temp 98.2°F | Wt 161.0 lb

## 2013-12-31 DIAGNOSIS — F4310 Post-traumatic stress disorder, unspecified: Secondary | ICD-10-CM

## 2013-12-31 DIAGNOSIS — M2550 Pain in unspecified joint: Secondary | ICD-10-CM

## 2013-12-31 DIAGNOSIS — F32A Depression, unspecified: Secondary | ICD-10-CM

## 2013-12-31 DIAGNOSIS — Z23 Encounter for immunization: Secondary | ICD-10-CM

## 2013-12-31 DIAGNOSIS — F329 Major depressive disorder, single episode, unspecified: Secondary | ICD-10-CM

## 2013-12-31 DIAGNOSIS — B20 Human immunodeficiency virus [HIV] disease: Secondary | ICD-10-CM

## 2013-12-31 MED ORDER — IBUPROFEN 800 MG PO TABS: 800.0000 mg | ORAL_TABLET | Freq: Three times a day (TID) | ORAL | Status: DC | PRN
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
## 2014-01-14 ENCOUNTER — Ambulatory Visit: Payer: Self-pay

## 2014-01-21 ENCOUNTER — Other Ambulatory Visit: Payer: Self-pay | Admitting: *Deleted

## 2014-01-21 DIAGNOSIS — R110 Nausea: Secondary | ICD-10-CM

## 2014-01-21 MED ORDER — ONDANSETRON HCL 4 MG PO TABS: 4.0000 mg | ORAL_TABLET | Freq: Three times a day (TID) | ORAL | Status: DC | PRN

## 2014-02-03 ENCOUNTER — Ambulatory Visit: Payer: Self-pay

## 2014-02-16 ENCOUNTER — Other Ambulatory Visit (HOSPITAL_COMMUNITY): Payer: Self-pay | Admitting: Psychiatry

## 2014-02-18 ENCOUNTER — Ambulatory Visit (INDEPENDENT_AMBULATORY_CARE_PROVIDER_SITE_OTHER): Payer: Self-pay | Admitting: Psychiatry

## 2014-02-18 ENCOUNTER — Encounter (HOSPITAL_COMMUNITY): Payer: Self-pay | Admitting: Psychiatry

## 2014-02-18 ENCOUNTER — Ambulatory Visit: Payer: Self-pay

## 2014-02-18 VITALS — BP 158/80 | HR 76 | Ht 69.0 in | Wt 164.4 lb

## 2014-02-18 DIAGNOSIS — F419 Anxiety disorder, unspecified: Secondary | ICD-10-CM

## 2014-02-18 DIAGNOSIS — F39 Unspecified mood [affective] disorder: Secondary | ICD-10-CM

## 2014-02-18 DIAGNOSIS — F329 Major depressive disorder, single episode, unspecified: Secondary | ICD-10-CM

## 2014-02-18 DIAGNOSIS — F319 Bipolar disorder, unspecified: Secondary | ICD-10-CM

## 2014-02-18 MED ORDER — BUPROPION HCL ER (XL) 150 MG PO TB24: 150.0000 mg | ORAL_TABLET | ORAL | Status: DC

## 2014-02-18 MED ORDER — DIVALPROEX SODIUM ER 500 MG PO TB24: 500.0000 mg | ORAL_TABLET | Freq: Every day | ORAL | Status: DC

## 2014-02-18 MED ORDER — CLONAZEPAM 1 MG PO TABS: 1.0000 mg | ORAL_TABLET | Freq: Every evening | ORAL | Status: DC | PRN

## 2014-02-18 MED ORDER — OLANZAPINE 5 MG PO TABS: 5.0000 mg | ORAL_TABLET | Freq: Every day | ORAL | Status: DC
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
## 2014-03-04 ENCOUNTER — Ambulatory Visit: Payer: Self-pay

## 2014-03-17 ENCOUNTER — Other Ambulatory Visit (HOSPITAL_COMMUNITY): Payer: Self-pay | Admitting: Psychiatry

## 2014-03-17 ENCOUNTER — Other Ambulatory Visit: Payer: Self-pay | Admitting: Licensed Clinical Social Worker

## 2014-03-17 DIAGNOSIS — B9689 Other specified bacterial agents as the cause of diseases classified elsewhere: Secondary | ICD-10-CM

## 2014-03-17 DIAGNOSIS — J329 Chronic sinusitis, unspecified: Principal | ICD-10-CM

## 2014-03-17 MED ORDER — ABACAVIR-DOLUTEGRAVIR-LAMIVUD 600-50-300 MG PO TABS: 1.0000 | ORAL_TABLET | Freq: Every day | ORAL | Status: DC

## 2014-03-17 NOTE — Telephone Encounter (Signed)
Klonopin refill too early as was given a new prescription on 02/18/14 by [name] with one refill.  Next scheduled appointment is 04/21/14.

## 2014-03-18 ENCOUNTER — Other Ambulatory Visit (INDEPENDENT_AMBULATORY_CARE_PROVIDER_SITE_OTHER): Payer: Self-pay

## 2014-03-18 ENCOUNTER — Ambulatory Visit: Payer: Self-pay

## 2014-03-18 ENCOUNTER — Other Ambulatory Visit: Payer: Self-pay | Admitting: *Deleted

## 2014-03-18 DIAGNOSIS — B9689 Other specified bacterial agents as the cause of diseases classified elsewhere: Secondary | ICD-10-CM

## 2014-03-18 DIAGNOSIS — Z113 Encounter for screening for infections with a predominantly sexual mode of transmission: Secondary | ICD-10-CM

## 2014-03-18 DIAGNOSIS — F4310 Post-traumatic stress disorder, unspecified: Secondary | ICD-10-CM

## 2014-03-18 DIAGNOSIS — B20 Human immunodeficiency virus [HIV] disease: Secondary | ICD-10-CM

## 2014-03-18 DIAGNOSIS — J329 Chronic sinusitis, unspecified: Principal | ICD-10-CM

## 2014-03-18 LAB — CBC WITH DIFFERENTIAL/PLATELET
BASOS ABS: 0 10*3/uL (ref 0.0–0.1)
BASOS PCT: 0 % (ref 0–1)
EOS ABS: 0.1 10*3/uL (ref 0.0–0.7)
Eosinophils Relative: 1 % (ref 0–5)
HCT: 43.6 % (ref 39.0–52.0)
HEMOGLOBIN: 16 g/dL (ref 13.0–17.0)
Lymphocytes Relative: 50 % — ABNORMAL HIGH (ref 12–46)
Lymphs Abs: 3.4 10*3/uL (ref 0.7–4.0)
MCH: 33.1 pg (ref 26.0–34.0)
MCHC: 36.7 g/dL — ABNORMAL HIGH (ref 30.0–36.0)
MCV: 90.1 fL (ref 78.0–100.0)
MONO ABS: 0.7 10*3/uL (ref 0.1–1.0)
MPV: 10.3 fL (ref 8.6–12.4)
Monocytes Relative: 11 % (ref 3–12)
Neutro Abs: 2.6 10*3/uL (ref 1.7–7.7)
Neutrophils Relative %: 38 % — ABNORMAL LOW (ref 43–77)
Platelets: 188 10*3/uL (ref 150–400)
RBC: 4.84 MIL/uL (ref 4.22–5.81)
RDW: 13.9 % (ref 11.5–15.5)
WBC: 6.8 10*3/uL (ref 4.0–10.5)

## 2014-03-18 LAB — COMPLETE METABOLIC PANEL WITH GFR
ALBUMIN: 4.2 g/dL (ref 3.5–5.2)
ALT: 25 U/L (ref 0–53)
AST: 21 U/L (ref 0–37)
Alkaline Phosphatase: 75 U/L (ref 39–117)
BILIRUBIN TOTAL: 0.6 mg/dL (ref 0.2–1.2)
BUN: 15 mg/dL (ref 6–23)
CALCIUM: 9.6 mg/dL (ref 8.4–10.5)
CO2: 26 mEq/L (ref 19–32)
CREATININE: 1.2 mg/dL (ref 0.50–1.35)
Chloride: 103 mEq/L (ref 96–112)
GFR, EST NON AFRICAN AMERICAN: 68 mL/min
GFR, Est African American: 78 mL/min
Glucose, Bld: 84 mg/dL (ref 70–99)
Potassium: 3.9 mEq/L (ref 3.5–5.3)
SODIUM: 141 meq/L (ref 135–145)
Total Protein: 7.3 g/dL (ref 6.0–8.3)

## 2014-03-18 LAB — RPR

## 2014-03-18 MED ORDER — ABACAVIR-DOLUTEGRAVIR-LAMIVUD 600-50-300 MG PO TABS
1.0000 | ORAL_TABLET | Freq: Every day | ORAL | Status: DC
Start: 2014-03-18 — End: 2014-11-02
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
## 2014-03-19 ENCOUNTER — Other Ambulatory Visit: Payer: Self-pay

## 2014-03-19 LAB — T-HELPER CELL (CD4) - (RCID CLINIC ONLY)
CD4 T CELL HELPER: 28 % — AB (ref 33–55)
CD4 T Cell Abs: 910 /uL (ref 400–2700)

## 2014-03-19 LAB — HIV-1 RNA QUANT-NO REFLEX-BLD: HIV-1 RNA Quant, Log: <1.3 {Log} (ref <1.30)

## 2014-03-19 LAB — URINE CYTOLOGY ANCILLARY ONLY
Chlamydia: NEGATIVE
NEISSERIA GONORRHEA: NEGATIVE

## 2014-04-01 ENCOUNTER — Encounter: Payer: Self-pay | Admitting: Infectious Disease

## 2014-04-01 ENCOUNTER — Ambulatory Visit: Payer: Self-pay

## 2014-04-01 ENCOUNTER — Ambulatory Visit (INDEPENDENT_AMBULATORY_CARE_PROVIDER_SITE_OTHER): Payer: Self-pay | Admitting: Infectious Disease

## 2014-04-01 VITALS — BP 166/103 | HR 69 | Temp 97.8°F | Wt 162.0 lb

## 2014-04-01 DIAGNOSIS — F4310 Post-traumatic stress disorder, unspecified: Secondary | ICD-10-CM

## 2014-04-01 DIAGNOSIS — F331 Major depressive disorder, recurrent, moderate: Secondary | ICD-10-CM

## 2014-04-01 DIAGNOSIS — M2550 Pain in unspecified joint: Secondary | ICD-10-CM | POA: Insufficient documentation

## 2014-04-01 DIAGNOSIS — I10 Essential (primary) hypertension: Secondary | ICD-10-CM

## 2014-04-01 DIAGNOSIS — B20 Human immunodeficiency virus [HIV] disease: Secondary | ICD-10-CM

## 2014-04-01 MED ORDER — AMLODIPINE BESYLATE 10 MG PO TABS: 10.0000 mg | ORAL_TABLET | Freq: Every day | ORAL | Status: DC
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
## 2014-04-01 NOTE — BH Specialist Note (Signed)
Alexis Welch continues to report that he feels like he is depressed and he spends most of his days in the bed.  He says he doesn't sleep, but just doesn't want to get up and do things.  He said he sometimes has trouble sleeping, especially going to sleep.  He said he sometimes gets something on his mind and can't get rid of it.  I suggested Melatonin for sleep and he agreed to give this a try.  He said he does get up when the sun goes down.  No suicidal ideation reported today.  Plan to meet in 2 weeks.  [name], LCSW  [place]: [phone]

## 2014-04-06 ENCOUNTER — Ambulatory Visit (INDEPENDENT_AMBULATORY_CARE_PROVIDER_SITE_OTHER): Payer: Self-pay | Admitting: *Deleted

## 2014-04-06 VITALS — BP 150/83 | HR 80 | Temp 97.8°F | Resp 16 | Ht 68.0 in | Wt 165.8 lb

## 2014-04-06 DIAGNOSIS — Z006 Encounter for examination for normal comparison and control in clinical research program: Secondary | ICD-10-CM

## 2014-04-06 NOTE — Progress Notes (Signed)
Naseer is here today to screen for the Reprieve study. Informed consent was obtained after we discussed the study in detail. He took both consents home with him last week and had time to read over them before coming in today. He was able to verbalize the purpose of the study and that he will be randomized to either pitavastatin or placebo. He understands it is completely voluntary. Current complaints today include joint aches, mainly his shoulders and rt arm, but has aches also in his bilat ankles and knees. He wonders if he could have fibromyalgia. He also has trouble sleeping due to the discomfort which he manages with ibuprofen. He is also under the care of a psychiatrist and counselor for severe depression and anxiety. He is also interested in the substudy for Reprieve and we discussed the risks and the procedures that go along with it. Once he is deemed eligible for the study, we will schedule the entry visit along with the CT scan.

## 2014-04-07 LAB — LIPID PANEL
CHOL/HDL RATIO: 5.2 ratio
CHOLESTEROL: 229 mg/dL — AB (ref 0–200)
HDL: 44 mg/dL (ref >=40)
LDL Cholesterol: 152 mg/dL — ABNORMAL HIGH (ref 0–99)
Triglycerides: 166 mg/dL — ABNORMAL HIGH (ref <150)
VLDL: 33 mg/dL (ref 0–40)

## 2014-04-15 ENCOUNTER — Ambulatory Visit: Payer: Self-pay

## 2014-04-15 ENCOUNTER — Ambulatory Visit: Payer: Self-pay | Admitting: *Deleted

## 2014-04-15 VITALS — BP 133/85 | HR 67

## 2014-04-15 DIAGNOSIS — Z006 Encounter for examination for normal comparison and control in clinical research program: Secondary | ICD-10-CM

## 2014-04-15 LAB — LIPID PANEL
CHOL/HDL RATIO: 6 ratio
Cholesterol: 246 mg/dL — ABNORMAL HIGH (ref 0–200)
HDL: 41 mg/dL (ref >=40)
LDL Cholesterol: 174 mg/dL — ABNORMAL HIGH (ref 0–99)
TRIGLYCERIDES: 154 mg/dL — AB (ref <150)
VLDL: 31 mg/dL (ref 0–40)

## 2014-04-15 LAB — COMPREHENSIVE METABOLIC PANEL
ALT: 17 U/L (ref 0–53)
AST: 17 U/L (ref 0–37)
Albumin: 4.6 g/dL (ref 3.5–5.2)
Alkaline Phosphatase: 76 U/L (ref 39–117)
BILIRUBIN TOTAL: 0.7 mg/dL (ref 0.2–1.2)
BUN: 16 mg/dL (ref 6–23)
CO2: 28 mEq/L (ref 19–32)
Calcium: 9.7 mg/dL (ref 8.4–10.5)
Chloride: 102 mEq/L (ref 96–112)
Creat: 1.32 mg/dL (ref 0.50–1.35)
GLUCOSE: 102 mg/dL — AB (ref 70–99)
Potassium: 4 mEq/L (ref 3.5–5.3)
SODIUM: 140 meq/L (ref 135–145)
TOTAL PROTEIN: 7.4 g/dL (ref 6.0–8.3)

## 2014-04-15 NOTE — Progress Notes (Signed)
Alexis Welch is here to recheck BP and lipids for Reprieve eligibility. He is fasting since 6pm yesterday. If eligible we will plan entry within the next 2 weeks.

## 2014-04-21 ENCOUNTER — Telehealth (HOSPITAL_COMMUNITY): Payer: Self-pay | Admitting: *Deleted

## 2014-04-21 ENCOUNTER — Encounter (HOSPITAL_COMMUNITY): Payer: Self-pay | Admitting: Psychiatry

## 2014-04-21 ENCOUNTER — Other Ambulatory Visit (INDEPENDENT_AMBULATORY_CARE_PROVIDER_SITE_OTHER): Payer: Self-pay

## 2014-04-21 ENCOUNTER — Ambulatory Visit (INDEPENDENT_AMBULATORY_CARE_PROVIDER_SITE_OTHER): Payer: Self-pay | Admitting: Psychiatry

## 2014-04-21 VITALS — BP 126/74 | HR 78 | Ht 69.0 in | Wt 165.0 lb

## 2014-04-21 DIAGNOSIS — F319 Bipolar disorder, unspecified: Secondary | ICD-10-CM

## 2014-04-21 DIAGNOSIS — F329 Major depressive disorder, single episode, unspecified: Secondary | ICD-10-CM

## 2014-04-21 DIAGNOSIS — F419 Anxiety disorder, unspecified: Secondary | ICD-10-CM

## 2014-04-21 DIAGNOSIS — F0630 Mood disorder due to known physiological condition, unspecified: Secondary | ICD-10-CM

## 2014-04-21 DIAGNOSIS — F4310 Post-traumatic stress disorder, unspecified: Secondary | ICD-10-CM

## 2014-04-21 DIAGNOSIS — Z79899 Other long term (current) drug therapy: Secondary | ICD-10-CM

## 2014-04-21 MED ORDER — BUPROPION HCL ER (XL) 150 MG PO TB24: 150.0000 mg | ORAL_TABLET | ORAL | Status: DC

## 2014-04-21 MED ORDER — OLANZAPINE 5 MG PO TABS: 5.0000 mg | ORAL_TABLET | Freq: Every day | ORAL | Status: DC

## 2014-04-21 MED ORDER — CLONAZEPAM 1 MG PO TABS: 1.0000 mg | ORAL_TABLET | Freq: Every evening | ORAL | Status: DC | PRN

## 2014-04-21 MED ORDER — DIVALPROEX SODIUM ER 500 MG PO TB24: 500.0000 mg | ORAL_TABLET | Freq: Every day | ORAL | Status: DC
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
## 2014-04-21 NOTE — Telephone Encounter (Signed)
[name],   Suezanne Jacquet from [place] called.  Number for [facility]--[phone]. They need you to call them, have to verify verbally from you that it is okay to fill patient's Klonopin because he is getting prescription early.

## 2014-04-22 LAB — HEMOGLOBIN A1C
Hgb A1c MFr Bld: 5.5 % (ref <5.7)
MEAN PLASMA GLUCOSE: 111 mg/dL (ref <117)

## 2014-04-22 LAB — VALPROIC ACID LEVEL: Valproic Acid Lvl: 61 ug/mL (ref 50.0–100.0)

## 2014-04-24 ENCOUNTER — Telehealth (HOSPITAL_COMMUNITY): Payer: Self-pay

## 2014-04-24 NOTE — Telephone Encounter (Signed)
April 24, 2014 -[name],  Please attach this form to the lab request this was left in the office - sorry for the delay. -Thanks -Sunday Spillers - Dr. [name] Office

## 2014-05-13 ENCOUNTER — Ambulatory Visit: Payer: Self-pay

## 2014-05-13 ENCOUNTER — Encounter: Payer: Self-pay | Admitting: *Deleted

## 2014-05-13 DIAGNOSIS — F4310 Post-traumatic stress disorder, unspecified: Secondary | ICD-10-CM
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
## 2014-05-13 NOTE — Progress Notes (Signed)
Patient ID: Alexis Welch, male   DOB: 09/26/1960, 54 y.o.   MRN: [id number] Pt has a question about starting "statin" rx due to elevated lipids.  Pt is willing to start a statin if MD is going to rx.  MD, please advise.  Pt did not qualify for Reprieve Study per K. Epperson.

## 2014-05-20 ENCOUNTER — Ambulatory Visit: Payer: Self-pay

## 2014-06-04 ENCOUNTER — Ambulatory Visit: Payer: Self-pay

## 2014-06-04 DIAGNOSIS — F4310 Post-traumatic stress disorder, unspecified: Secondary | ICD-10-CM
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
## 2014-06-18 ENCOUNTER — Ambulatory Visit: Payer: Self-pay

## 2014-06-22 ENCOUNTER — Encounter (HOSPITAL_COMMUNITY): Payer: Self-pay | Admitting: Psychiatry

## 2014-06-22 ENCOUNTER — Ambulatory Visit (INDEPENDENT_AMBULATORY_CARE_PROVIDER_SITE_OTHER): Payer: Self-pay | Admitting: Psychiatry

## 2014-06-22 VITALS — BP 121/83 | HR 87 | Ht 69.0 in | Wt 162.0 lb

## 2014-06-22 DIAGNOSIS — F39 Unspecified mood [affective] disorder: Secondary | ICD-10-CM

## 2014-06-22 DIAGNOSIS — F319 Bipolar disorder, unspecified: Secondary | ICD-10-CM

## 2014-06-22 DIAGNOSIS — F419 Anxiety disorder, unspecified: Secondary | ICD-10-CM

## 2014-06-22 MED ORDER — OLANZAPINE 5 MG PO TABS: 5.0000 mg | ORAL_TABLET | Freq: Every day | ORAL | Status: DC

## 2014-06-22 MED ORDER — BUPROPION HCL ER (XL) 150 MG PO TB24: 150.0000 mg | ORAL_TABLET | ORAL | Status: DC

## 2014-06-22 MED ORDER — DIVALPROEX SODIUM ER 500 MG PO TB24: 500.0000 mg | ORAL_TABLET | Freq: Every day | ORAL | Status: DC

## 2014-06-22 MED ORDER — CLONAZEPAM 1 MG PO TABS: 1.0000 mg | ORAL_TABLET | Freq: Every evening | ORAL | Status: DC | PRN
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
## 2014-06-28 ENCOUNTER — Other Ambulatory Visit (HOSPITAL_COMMUNITY): Payer: Self-pay | Admitting: Psychiatry

## 2014-06-28 NOTE — Telephone Encounter (Signed)
Refill requests for patient's Wellbutrin and Zyprexa declined at this time as new orders were e-scribed to patient's [facility] on 06/22/14 plus one refill of each.

## 2014-06-30 ENCOUNTER — Ambulatory Visit: Payer: Self-pay

## 2014-07-14 ENCOUNTER — Ambulatory Visit: Payer: Self-pay

## 2014-07-21 ENCOUNTER — Ambulatory Visit: Payer: Self-pay

## 2014-07-21 DIAGNOSIS — F4310 Post-traumatic stress disorder, unspecified: Secondary | ICD-10-CM

## 2014-07-21 NOTE — BH Specialist Note (Signed)
Eleuterio's mood seemed slightly better today, reporting that he still struggles with motivation to get up and get going, but also saying he has painted 2 rooms in his house and has planted a garden.  He is also giving thought to visiting a friend in [place], but worries about his small dog and what to do with it, since his friend doesn't like dogs in his house.  We brainstormed some about this, but he is actually considering giving the dog away so that he can feel free to get out and travel some.  He also is concerned about the fact that one of his mentally ill sisters is planning to move to an apartment building a block from his home.  He fears that she will come and bother him constantly.  Plan to meet again in 2 weeks. [name], LCSW  [place]: [phone]

## 2014-08-04 ENCOUNTER — Ambulatory Visit: Payer: Self-pay

## 2014-08-04 DIAGNOSIS — F32A Depression, unspecified: Secondary | ICD-10-CM

## 2014-08-04 DIAGNOSIS — F329 Major depressive disorder, single episode, unspecified: Secondary | ICD-10-CM

## 2014-08-04 NOTE — BH Specialist Note (Signed)
Alexis Welch was in a pleasant mood today, reporting that his main problem is getting up and going in the mornings.  Once he gets going, he said, things go better, but some days he just doesn't want to do anything.  He also said he is lonely but doesn't know how to remedy that.  He was able to smile and laugh occasionally during the session.  He talked some about how he is reflecting on his life and realizing that he has had psychiatric issues his whole life, but didn't ever get treatment for it until recently.  Plan to meet again in 3 weeks. [name], LCSW

## 2014-08-24 ENCOUNTER — Ambulatory Visit: Payer: Self-pay

## 2014-08-24 DIAGNOSIS — F4310 Post-traumatic stress disorder, unspecified: Secondary | ICD-10-CM

## 2014-08-24 NOTE — BH Specialist Note (Signed)
Alexis Welch was pleasant today, reporting that things are going okay.  Alexis Welch says Alexis Welch stays in the bed most days but has recently taken his bike on a new 8 mile greenway around [place]. Alexis Welch and Alexis Welch enjoyed it.  Alexis Welch also talked about an old friend from [place] that Alexis Welch is starting to spend more time with.  Alexis Welch would spend more with this friend Alexis Welch says but Alexis Welch is a deterrent, because the friend is allergic and is also meticulous and doesn't want a Welch in his house.  This may change when Alexis Welch moves to [place]. Alexis Welch in the near future, Alexis Welch said.  Alexis Welch worries about threats from the government about garnishing his wages due to back taxes.  Alexis Welch also expressed concern about a slight tremor in his hands.  Plan to meet in 3 weeks. Alexis Spice, LCSW

## 2014-08-30 ENCOUNTER — Other Ambulatory Visit (HOSPITAL_COMMUNITY): Payer: Self-pay | Admitting: Psychiatry

## 2014-08-31 ENCOUNTER — Other Ambulatory Visit (HOSPITAL_COMMUNITY): Payer: Self-pay | Admitting: Psychiatry

## 2014-08-31 DIAGNOSIS — F319 Bipolar disorder, unspecified: Secondary | ICD-10-CM

## 2014-08-31 MED ORDER — BUPROPION HCL ER (XL) 150 MG PO TB24: 150.0000 mg | ORAL_TABLET | ORAL | Status: DC

## 2014-08-31 MED ORDER — OLANZAPINE 5 MG PO TABS: 5.0000 mg | ORAL_TABLET | Freq: Every day | ORAL | Status: DC

## 2014-08-31 MED ORDER — DIVALPROEX SODIUM ER 500 MG PO TB24: 500.0000 mg | ORAL_TABLET | Freq: Every day | ORAL | Status: DC

## 2014-09-03 ENCOUNTER — Ambulatory Visit (HOSPITAL_COMMUNITY): Payer: Self-pay | Admitting: Psychiatry

## 2014-09-10 ENCOUNTER — Ambulatory Visit (INDEPENDENT_AMBULATORY_CARE_PROVIDER_SITE_OTHER): Payer: Self-pay | Admitting: Psychiatry

## 2014-09-10 ENCOUNTER — Encounter (HOSPITAL_COMMUNITY): Payer: Self-pay | Admitting: Psychiatry

## 2014-09-10 VITALS — BP 152/88 | HR 69 | Ht 69.0 in | Wt 166.0 lb

## 2014-09-10 DIAGNOSIS — F319 Bipolar disorder, unspecified: Secondary | ICD-10-CM

## 2014-09-10 DIAGNOSIS — F419 Anxiety disorder, unspecified: Secondary | ICD-10-CM

## 2014-09-10 DIAGNOSIS — F0630 Mood disorder due to known physiological condition, unspecified: Secondary | ICD-10-CM

## 2014-09-10 MED ORDER — OLANZAPINE 5 MG PO TABS: 5.0000 mg | ORAL_TABLET | Freq: Every day | ORAL | Status: DC

## 2014-09-10 MED ORDER — BUPROPION HCL ER (XL) 150 MG PO TB24: 150.0000 mg | ORAL_TABLET | ORAL | Status: DC

## 2014-09-10 MED ORDER — CLONAZEPAM 1 MG PO TABS: 1.0000 mg | ORAL_TABLET | Freq: Every evening | ORAL | Status: DC | PRN

## 2014-09-10 MED ORDER — DIVALPROEX SODIUM ER 500 MG PO TB24: 500.0000 mg | ORAL_TABLET | Freq: Every day | ORAL | Status: DC
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
## 2014-09-15 ENCOUNTER — Ambulatory Visit: Payer: Self-pay

## 2014-09-15 ENCOUNTER — Other Ambulatory Visit (INDEPENDENT_AMBULATORY_CARE_PROVIDER_SITE_OTHER): Payer: Self-pay

## 2014-09-15 DIAGNOSIS — B20 Human immunodeficiency virus [HIV] disease: Secondary | ICD-10-CM

## 2014-09-15 DIAGNOSIS — F4310 Post-traumatic stress disorder, unspecified: Secondary | ICD-10-CM

## 2014-09-15 DIAGNOSIS — Z79899 Other long term (current) drug therapy: Secondary | ICD-10-CM

## 2014-09-15 DIAGNOSIS — Z113 Encounter for screening for infections with a predominantly sexual mode of transmission: Secondary | ICD-10-CM

## 2014-09-15 DIAGNOSIS — I10 Essential (primary) hypertension: Secondary | ICD-10-CM

## 2014-09-15 LAB — LIPID PANEL
CHOLESTEROL: 220 mg/dL — AB (ref 125–200)
HDL: 36 mg/dL — ABNORMAL LOW (ref >=40)
LDL Cholesterol: 145 mg/dL — ABNORMAL HIGH (ref <130)
Total CHOL/HDL Ratio: 6.1 Ratio — ABNORMAL HIGH (ref <=5.0)
Triglycerides: 196 mg/dL — ABNORMAL HIGH (ref <150)
VLDL: 39 mg/dL — ABNORMAL HIGH (ref <30)

## 2014-09-15 LAB — CBC WITH DIFFERENTIAL/PLATELET
BASOS ABS: 0 10*3/uL (ref 0.0–0.1)
Basophils Relative: 0 % (ref 0–1)
Eosinophils Absolute: 0.1 10*3/uL (ref 0.0–0.7)
Eosinophils Relative: 2 % (ref 0–5)
HEMATOCRIT: 44.7 % (ref 39.0–52.0)
Hemoglobin: 16.1 g/dL (ref 13.0–17.0)
LYMPHS PCT: 48 % — AB (ref 12–46)
Lymphs Abs: 3.5 10*3/uL (ref 0.7–4.0)
MCH: 32.3 pg (ref 26.0–34.0)
MCHC: 36 g/dL (ref 30.0–36.0)
MCV: 89.8 fL (ref 78.0–100.0)
MONO ABS: 0.7 10*3/uL (ref 0.1–1.0)
MPV: 10.5 fL (ref 8.6–12.4)
Monocytes Relative: 9 % (ref 3–12)
NEUTROS PCT: 41 % — AB (ref 43–77)
Neutro Abs: 3 10*3/uL (ref 1.7–7.7)
Platelets: 206 10*3/uL (ref 150–400)
RBC: 4.98 MIL/uL (ref 4.22–5.81)
RDW: 13.4 % (ref 11.5–15.5)
WBC: 7.3 10*3/uL (ref 4.0–10.5)

## 2014-09-15 LAB — COMPLETE METABOLIC PANEL WITH GFR
ALT: 18 U/L (ref 9–46)
AST: 16 U/L (ref 10–35)
Albumin: 4.2 g/dL (ref 3.6–5.1)
Alkaline Phosphatase: 78 U/L (ref 40–115)
BILIRUBIN TOTAL: 0.5 mg/dL (ref 0.2–1.2)
BUN: 12 mg/dL (ref 7–25)
CO2: 30 mmol/L (ref 20–31)
Calcium: 9.5 mg/dL (ref 8.6–10.3)
Chloride: 102 mmol/L (ref 98–110)
Creat: 1.42 mg/dL — ABNORMAL HIGH (ref 0.70–1.33)
GFR, EST NON AFRICAN AMERICAN: 55 mL/min — AB (ref >=60)
GFR, Est African American: 64 mL/min (ref >=60)
Glucose, Bld: 86 mg/dL (ref 65–99)
Potassium: 3.8 mmol/L (ref 3.5–5.3)
SODIUM: 142 mmol/L (ref 135–146)
Total Protein: 7.2 g/dL (ref 6.1–8.1)

## 2014-09-15 LAB — HEPATITIS C ANTIBODY: HCV Ab: NEGATIVE

## 2014-09-15 NOTE — Addendum Note (Signed)
Addended by: Romana Juniper D on: 09/15/2014 11:17 AM   Modules accepted: Orders
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
## 2014-09-15 NOTE — Addendum Note (Signed)
Addended by: Dolan Amen D on: 09/15/2014 11:45 AM   Modules accepted: Orders

## 2014-09-16 ENCOUNTER — Other Ambulatory Visit: Payer: Self-pay

## 2014-09-16 LAB — T-HELPER CELL (CD4) - (RCID CLINIC ONLY)
CD4 % Helper T Cell: 32 % — ABNORMAL LOW (ref 33–55)
CD4 T Cell Abs: 1170 /uL (ref 400–2700)

## 2014-09-16 LAB — RPR

## 2014-09-17 LAB — HIV-1 RNA QUANT-NO REFLEX-BLD: HIV-1 RNA Quant, Log: <1.3 {Log} (ref <1.30)

## 2014-09-30 ENCOUNTER — Ambulatory Visit: Payer: Self-pay

## 2014-09-30 ENCOUNTER — Ambulatory Visit (INDEPENDENT_AMBULATORY_CARE_PROVIDER_SITE_OTHER): Payer: Self-pay | Admitting: Infectious Disease

## 2014-09-30 ENCOUNTER — Encounter: Payer: Self-pay | Admitting: Infectious Disease

## 2014-09-30 VITALS — BP 139/82 | HR 71 | Temp 98.0°F | Wt 168.0 lb

## 2014-09-30 DIAGNOSIS — F329 Major depressive disorder, single episode, unspecified: Secondary | ICD-10-CM

## 2014-09-30 DIAGNOSIS — F4310 Post-traumatic stress disorder, unspecified: Secondary | ICD-10-CM

## 2014-09-30 DIAGNOSIS — F331 Major depressive disorder, recurrent, moderate: Secondary | ICD-10-CM

## 2014-09-30 DIAGNOSIS — B20 Human immunodeficiency virus [HIV] disease: Secondary | ICD-10-CM

## 2014-09-30 DIAGNOSIS — F32A Depression, unspecified: Secondary | ICD-10-CM

## 2014-09-30 NOTE — Progress Notes (Signed)
  Subjective:    Patient ID: Alexis Welch, male    DOB: 1960-08-15, 54 y.o.   MRN: [id number]  HPI   Luian Schumpert is a 54 y.o. male who is doing superbly well on [place]   He has been  seeing [name] from psychiatry who is managing his psychiatric issues along with counselling + counseling with [name]. He has been started back on depakote.   We also discussed REPRIEVE trial and NOW with revised exclusion criteria he is now likely eligible.  .Review of Systems  Constitutional: Negative for fever, chills, diaphoresis, activity change, appetite change, fatigue and unexpected weight change.  HENT: Negative for congestion, nosebleeds, postnasal drip, rhinorrhea, sinus pressure, sneezing, sore throat and trouble swallowing.   Eyes: Negative for photophobia and visual disturbance.  Respiratory: Negative for cough, chest tightness, shortness of breath, wheezing and stridor.   Cardiovascular: Negative for chest pain, palpitations and leg swelling.  Gastrointestinal: Negative for nausea, vomiting, abdominal pain, diarrhea, constipation, blood in stool, abdominal distention and anal bleeding.  Genitourinary: Negative for dysuria, hematuria, flank pain and difficulty urinating.  Musculoskeletal: Positive for arthralgias. Negative for myalgias, back pain, joint swelling and gait problem.  Skin: Negative for color change, pallor, rash and wound.  Neurological: Negative for dizziness, tremors, weakness and light-headedness.  Hematological: Negative for adenopathy. Does not bruise/bleed easily.  Psychiatric/Behavioral: Positive for dysphoric mood. Negative for confusion, sleep disturbance, decreased concentration and agitation.       Objective:   Physical Exam  Constitutional: He is oriented to person, place, and time. He appears well-developed and well-nourished. No distress.  HENT:  Head: Normocephalic and atraumatic.  Mouth/Throat: Oropharynx is clear and moist. No oropharyngeal  exudate.  Eyes: Conjunctivae and EOM are normal. Pupils are equal, round, and reactive to light. No scleral icterus.  Neck: Normal range of motion. Neck supple. No JVD present.  Cardiovascular: Normal rate and regular rhythm.   Pulmonary/Chest: Effort normal and breath sounds normal. No respiratory distress.  Abdominal: He exhibits no distension.  Musculoskeletal: He exhibits no edema or tenderness.  Lymphadenopathy:    He has no cervical adenopathy.  Neurological: He is alert and oriented to person, place, and time. He exhibits normal muscle tone. Coordination normal.  Skin: Skin is warm and dry. He is not diaphoretic. No erythema. No pallor.  Psychiatric: His behavior is normal. Judgment and thought content normal.          Assessment & Plan:  HIV: continue  [place].  Lab Results  Component Value Date   HIV1RNAQUANT <20 09/15/2014   Lab Results  Component Value Date   CD4TABS 1170 09/15/2014   CD4TABS 910 03/18/2014   CD4TABS 810 12/18/2013   Bring back in 6 months to check labs again   Bipolar, Depression: Continue depakote and continue to see [name]  And [name]  HTN: continue  norvasc.   PTSD: seeing [name]   I spent greater than 25 minutes with the patient including greater than 50% of time in face to face counsel of the patient  Re his HIV, depression, PTSD, HTN and in coordination of their care.

## 2014-09-30 NOTE — BH Specialist Note (Signed)
Alexis Welch was in a pleasant mood today, laughing and smiling at times.  He continues to report that most days he has trouble getting up and out of bed until late in the day.  He said he adjusted the time he takes his meds in the evening by 2 hours and now he is getting up around 12 or 1, which is an improvement.  We also talked about how he has always put other people's needs ahead of his own.  I gave him a DBT handout on assertiveness, asking for what you want, and saying no.  I also suggested he find sleep meditation music on his smart phone to help with sleep, which he agreed to try.  Plan to meet in 3 weeks. [name], LCSW

## 2014-10-07 ENCOUNTER — Telehealth (HOSPITAL_COMMUNITY): Payer: Self-pay

## 2014-10-07 NOTE — Telephone Encounter (Signed)
Medication management - Telephone call with patient's [place] with Amy to verify when patient last filled Depakote and if not being approved.  Agreed to follow up with ADAP program at [phone] due to medication no longer covered and last filled in March 2016 per pharmacist.  [place] ADAP ([place]) and spoke with Tonia Ghent a pharmacy technician who provided the ZYS#[id number] for Depakote ER 500mg  that should be covered under patient's plan and they requested patient's pharmacy call them at [phone] and put in Option #7 for pharmacy support if they had problems getting medication to go through for patient.  Called [facility] back and spoke with [employer], pharmacist who agreed to try the provided [place] number to fill patient's Depakote ER order and they will call ADAP and put in option 7 if any problems.  Called patient a second time today after message left earlier to inform to call [facility] today and let us know if Depakote was not able to be put through for filling.  Patient agreed with plan and will call back if any problems.

## 2014-10-22 ENCOUNTER — Ambulatory Visit: Payer: Self-pay

## 2014-10-31 ENCOUNTER — Other Ambulatory Visit: Payer: Self-pay | Admitting: Infectious Disease

## 2014-10-31 DIAGNOSIS — B20 Human immunodeficiency virus [HIV] disease: Secondary | ICD-10-CM

## 2014-11-02 NOTE — Progress Notes (Signed)
[facility] notified by fax. Landis Gandy, RN

## 2014-11-05 ENCOUNTER — Ambulatory Visit: Payer: Self-pay

## 2014-11-06 ENCOUNTER — Telehealth: Payer: Self-pay | Admitting: *Deleted

## 2014-11-06 NOTE — Telephone Encounter (Signed)
-----   Message from [name], MD sent at 11/06/2014 10:37 AM EDT ----- Regarding: RE: Statin? Thanks Kim we'll bring him in to get him onto a statin. I am not sure when his next apt currently is. Its too bad because he is kind of guy that might benefit from more frequent clinical touches that research provides. Unless I am mixing him up with the other pt with the same name! ----- Message -----    From: Bobbie Stack, RN    Sent: 11/06/2014   8:33 AM      To: [name], MD Subject: Statin?                                        Jac Canavan,  After looking at [employer] labs he still doesn't qualify for [place]. His LDL is too high. His ASCVD score is >10 and his LDL needs to be under 130 I think.   I let him know and he is asking about starting statin therapy. Can you give him a call. I don't think he has a primary care physician.   Thanks,  Maudie Mercury

## 2014-11-06 NOTE — Telephone Encounter (Signed)
Left message stating that Alexis Welch had spoken with Dr. Tommy Medal and he would like the patient to come in for an appointment.  Asked him to call the clinic for an appointment. Landis Gandy, RN

## 2014-11-23 ENCOUNTER — Ambulatory Visit (INDEPENDENT_AMBULATORY_CARE_PROVIDER_SITE_OTHER): Payer: Self-pay | Admitting: Infectious Disease

## 2014-11-23 ENCOUNTER — Encounter: Payer: Self-pay | Admitting: Infectious Disease

## 2014-11-23 VITALS — BP 166/91 | HR 75 | Temp 98.2°F | Ht 69.0 in | Wt 168.0 lb

## 2014-11-23 DIAGNOSIS — B20 Human immunodeficiency virus [HIV] disease: Secondary | ICD-10-CM

## 2014-11-23 DIAGNOSIS — Z23 Encounter for immunization: Secondary | ICD-10-CM

## 2014-11-23 DIAGNOSIS — E785 Hyperlipidemia, unspecified: Secondary | ICD-10-CM

## 2014-11-23 DIAGNOSIS — F4310 Post-traumatic stress disorder, unspecified: Secondary | ICD-10-CM

## 2014-11-23 DIAGNOSIS — F3130 Bipolar disorder, current episode depressed, mild or moderate severity, unspecified: Secondary | ICD-10-CM

## 2014-11-23 DIAGNOSIS — I10 Essential (primary) hypertension: Secondary | ICD-10-CM

## 2014-11-23 DIAGNOSIS — G251 Drug-induced tremor: Secondary | ICD-10-CM

## 2014-11-23 HISTORY — DX: Bipolar disorder, current episode depressed, mild or moderate severity, unspecified: F31.30

## 2014-11-23 HISTORY — DX: Drug-induced tremor: G25.1

## 2014-11-23 MED ORDER — ATORVASTATIN CALCIUM 20 MG PO TABS: 20.0000 mg | ORAL_TABLET | Freq: Every day | ORAL | Status: DC

## 2014-11-23 NOTE — Progress Notes (Signed)
Chief complaint: followup for CV risk and c/o tremor Subjective:    Patient ID: Alexis Welch, male    DOB: August 05, 1960, 54 y.o.   MRN: [id number]  HPI   Alexis Welch is a 54 y.o. male who is doing superbly well on [place]   He has been  seeing [name] from psychiatry who is managing his psychiatric issues along with counselling + counseling with [name]. He has been started back on depakote.  He was found on CV calculator to have excess CV risk to be enrolled into REPRIEVE and we brought him back to discuss starting statin for him. He has noticed an intention tremor that family pointed out to him.    Past Medical History  Diagnosis Date  . HIV disease ([place])   . Depression   . Hypertension   . Anxiety   . Arthritis     No past surgical history on file.  Family History  Problem Relation Age of Onset  . Cancer Father     colon cancer  . Alcohol abuse Father   . Dementia Father   . Heart disease Maternal Uncle   . Alcohol abuse Maternal Uncle   . Depression Mother   . Physical abuse Mother   . Bipolar disorder Sister   . Schizophrenia Sister   . OCD Sister   . Alcohol abuse Brother   . Drug abuse Brother   . Seizures Brother   . Alcohol abuse Maternal Aunt   . Depression Maternal Aunt   . Alcohol abuse Paternal Uncle   . Depression Paternal Uncle       Social History   Social History  . Marital Status: Single    Spouse Name: N/A  . Number of Children: N/A  . Years of Education: N/A   Social History Main Topics  . Smoking status: Never Smoker   . Smokeless tobacco: Never Used  . Alcohol Use: No  . Drug Use: Yes    Special: Marijuana  . Sexual Activity: Not Currently     Comment: declined condoms   Other Topics Concern  . None   Social History Narrative    No Known Allergies   Current outpatient prescriptions:  .  amLODipine (NORVASC) 10 MG tablet, Take 1 tablet (10 mg total) by mouth daily., Disp: 30 tablet, Rfl: 11 .  buPROPion  (WELLBUTRIN XL) 150 MG 24 hr tablet, Take 1 tablet (150 mg total) by mouth every morning., Disp: 30 tablet, Rfl: 2 .  clonazePAM (KLONOPIN) 1 MG tablet, Take 1 tablet (1 mg total) by mouth at bedtime as needed for anxiety., Disp: 30 tablet, Rfl: 1 .  divalproex (DEPAKOTE ER) 500 MG 24 hr tablet, Take 1 tablet (500 mg total) by mouth at bedtime., Disp: 60 tablet, Rfl: 2 .  ibuprofen (ADVIL,MOTRIN) 800 MG tablet, Take 1 tablet (800 mg total) by mouth every 8 (eight) hours as needed., Disp: 90 tablet, Rfl: 5 .  OLANZapine (ZYPREXA) 5 MG tablet, Take 1 tablet (5 mg total) by mouth at bedtime., Disp: 30 tablet, Rfl: 2 .  ondansetron (ZOFRAN) 4 MG tablet, Take 1 tablet (4 mg total) by mouth 3 (three) times daily as needed for nausea., Disp: 30 tablet, Rfl: 0 .  TRIUMEQ 600-50-300 MG TABS, TAKE 1 TABLET BY MOUTH EVERY DAY, Disp: 30 tablet, Rfl: 5 .  atorvastatin (LIPITOR) 20 MG tablet, Take 1 tablet (20 mg total) by mouth daily., Disp: 30 tablet, Rfl: 11   .Review of Systems  Constitutional: Negative for  fever, chills, diaphoresis, activity change, appetite change, fatigue and unexpected weight change.  HENT: Negative for congestion, nosebleeds, postnasal drip, rhinorrhea, sinus pressure, sneezing, sore throat and trouble swallowing.   Eyes: Negative for photophobia and visual disturbance.  Respiratory: Negative for cough, chest tightness, shortness of breath, wheezing and stridor.   Cardiovascular: Negative for chest pain, palpitations and leg swelling.  Gastrointestinal: Negative for nausea, vomiting, abdominal pain, diarrhea, constipation, blood in stool, abdominal distention and anal bleeding.  Genitourinary: Negative for dysuria, hematuria, flank pain and difficulty urinating.  Musculoskeletal: Negative for myalgias, back pain, joint swelling and gait problem.  Skin: Negative for color change, pallor, rash and wound.  Neurological: Positive for tremors. Negative for dizziness, weakness and  light-headedness.  Hematological: Negative for adenopathy. Does not bruise/bleed easily.  Psychiatric/Behavioral: Positive for dysphoric mood. Negative for confusion, sleep disturbance, decreased concentration and agitation.       Objective:   Physical Exam  Constitutional: He is oriented to person, place, and time. He appears well-developed and well-nourished. No distress.  HENT:  Head: Normocephalic and atraumatic.  Mouth/Throat: Oropharynx is clear and moist. No oropharyngeal exudate.  Eyes: Conjunctivae and EOM are normal. Pupils are equal, round, and reactive to light. No scleral icterus.  Neck: Normal range of motion. Neck supple. No JVD present.  Cardiovascular: Normal rate and regular rhythm.   Pulmonary/Chest: Effort normal and breath sounds normal. No respiratory distress.  Abdominal: He exhibits no distension.  Musculoskeletal: He exhibits no edema or tenderness.  Lymphadenopathy:    He has no cervical adenopathy.  Neurological: He is alert and oriented to person, place, and time. He displays tremor. He exhibits normal muscle tone. Coordination normal.  Skin: Skin is warm and dry. He is not diaphoretic. No erythema. No pallor.  Psychiatric: His behavior is normal. Judgment and thought content normal.          Assessment & Plan:  HIV: continue  [place].  Lab Results  Component Value Date   HIV1RNAQUANT <20 09/15/2014   Lab Results  Component Value Date   CD4TABS 1170 09/15/2014   CD4TABS 910 03/18/2014   CD4TABS 810 12/18/2013   Bring back in 4 months to check labs again  CV risk: start lipitor 20mg   Hyperlipidemia: see above  New Tremor: possibly due to the zyprexa or wellbutrin? To see Psychiatry re these meds. Would always ask for double check with drug drug interactions with ARVs  Bipolar, Depression: continue to see [name] and meds per [name]  HTN: continue  norvasc. BP not at goal today. May need another medication  PTSD: seeing [name]   I  spent greater than 25 minutes with the patient including greater than 50% of time in face to face counsel of the patient  Re his HIV, CV rus  depression, PTSD, HTN and in coordination of their care.

## 2014-11-28 ENCOUNTER — Other Ambulatory Visit (HOSPITAL_COMMUNITY): Payer: Self-pay | Admitting: Psychiatry

## 2014-11-28 DIAGNOSIS — F3162 Bipolar disorder, current episode mixed, moderate: Secondary | ICD-10-CM

## 2014-11-28 DIAGNOSIS — F411 Generalized anxiety disorder: Secondary | ICD-10-CM

## 2014-12-02 ENCOUNTER — Telehealth (HOSPITAL_COMMUNITY): Payer: Self-pay

## 2014-12-02 NOTE — Telephone Encounter (Signed)
Medication management - Fax refill request received for patient's prescribed Clonazepam, last written on 09/10/14 with one refill and patient returns next on 12/11/14.

## 2014-12-03 ENCOUNTER — Ambulatory Visit: Payer: Self-pay

## 2014-12-03 ENCOUNTER — Other Ambulatory Visit (HOSPITAL_COMMUNITY): Payer: Self-pay | Admitting: Psychiatry

## 2014-12-03 DIAGNOSIS — F4310 Post-traumatic stress disorder, unspecified: Secondary | ICD-10-CM

## 2014-12-03 NOTE — Telephone Encounter (Signed)
He is not due until his next appt

## 2014-12-03 NOTE — BH Specialist Note (Signed)
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  He even went with him and their 2 daughters to the [employer] in [place], which he is glad he did.  He said he struggles sometimes with forcing himself to do things like that but later is glad.  We talked briefly about returning to EMDR as a treatment option.  He talked about the fact that he doesn't know many friends and doesn't have any social life.  I told him about the Peer Support Group and he expressed strong interest in it, so I introduced him to Alexis Welch.  Plan to meet again in 3 weeks. [name], LCSW

## 2014-12-08 NOTE — Telephone Encounter (Signed)
Met with [name] who authorized a one time refill of patient's prescribed Clonazepam.  Called in the one time order to [facility] on [address] with [place], Software engineer.

## 2014-12-11 ENCOUNTER — Ambulatory Visit (HOSPITAL_COMMUNITY): Payer: Self-pay | Admitting: Psychiatry

## 2014-12-15 ENCOUNTER — Ambulatory Visit (INDEPENDENT_AMBULATORY_CARE_PROVIDER_SITE_OTHER): Payer: Self-pay | Admitting: Psychiatry

## 2014-12-15 ENCOUNTER — Encounter (HOSPITAL_COMMUNITY): Payer: Self-pay | Admitting: Psychiatry

## 2014-12-15 VITALS — BP 121/81 | HR 98 | Ht 69.0 in | Wt 168.2 lb

## 2014-12-15 DIAGNOSIS — F319 Bipolar disorder, unspecified: Secondary | ICD-10-CM

## 2014-12-15 DIAGNOSIS — F39 Unspecified mood [affective] disorder: Secondary | ICD-10-CM

## 2014-12-15 DIAGNOSIS — F419 Anxiety disorder, unspecified: Secondary | ICD-10-CM

## 2014-12-15 DIAGNOSIS — F3162 Bipolar disorder, current episode mixed, moderate: Secondary | ICD-10-CM

## 2014-12-15 MED ORDER — OLANZAPINE 5 MG PO TABS: 5.0000 mg | ORAL_TABLET | Freq: Every day | ORAL | Status: DC

## 2014-12-15 MED ORDER — BUPROPION HCL ER (XL) 150 MG PO TB24: 150.0000 mg | ORAL_TABLET | ORAL | Status: DC

## 2014-12-15 MED ORDER — CLONAZEPAM 1 MG PO TABS: ORAL_TABLET | ORAL | Status: DC

## 2014-12-15 MED ORDER — BENZTROPINE MESYLATE 1 MG PO TABS: 1.0000 mg | ORAL_TABLET | Freq: Every day | ORAL | Status: DC

## 2014-12-15 MED ORDER — DIVALPROEX SODIUM ER 500 MG PO TB24: 500.0000 mg | ORAL_TABLET | Freq: Every day | ORAL | Status: DC
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
## 2014-12-23 ENCOUNTER — Ambulatory Visit: Payer: Self-pay

## 2015-01-04 ENCOUNTER — Ambulatory Visit: Payer: Self-pay

## 2015-01-04 DIAGNOSIS — F4310 Post-traumatic stress disorder, unspecified: Secondary | ICD-10-CM

## 2015-01-04 NOTE — BH Specialist Note (Signed)
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  But he learned from that experience that if he has something to do, he can motivate himself to get up and get going.  We discussed his interest in antiques and vintage items and how he wishes he could find someone who likes that.  He says he is "really, really" lonely.  He is planning to come to the new peer support group here at the clinic, which I encouraged.  He also expressed interest in doing EMDR again. Plan to meet again in 3 weeks. [name], LCSW

## 2015-01-26 ENCOUNTER — Ambulatory Visit: Payer: Self-pay

## 2015-01-26 DIAGNOSIS — F4310 Post-traumatic stress disorder, unspecified: Secondary | ICD-10-CM

## 2015-01-26 NOTE — BH Specialist Note (Signed)
Alexis Welch reports that he has been staying in bed til 1:00 pm or so and watches TV all day.  He said he just can't seem to get going most days.  I talked to him about his interest in vintage and antiques and it seemed to perk him up some, but he doesn't see himself getting involved much in it at this point.  Plan to meet again in 3 weeks and he agreed to start coming every 2 weeks after the new year. [name], LCSW

## 2015-02-17 ENCOUNTER — Ambulatory Visit: Payer: Self-pay

## 2015-02-22 ENCOUNTER — Ambulatory Visit: Payer: Self-pay

## 2015-03-08 ENCOUNTER — Ambulatory Visit: Payer: Self-pay

## 2015-03-15 ENCOUNTER — Other Ambulatory Visit: Payer: Self-pay

## 2015-03-17 ENCOUNTER — Ambulatory Visit: Payer: Self-pay

## 2015-03-17 ENCOUNTER — Ambulatory Visit (INDEPENDENT_AMBULATORY_CARE_PROVIDER_SITE_OTHER): Payer: Self-pay | Admitting: Psychiatry

## 2015-03-17 ENCOUNTER — Encounter (HOSPITAL_COMMUNITY): Payer: Self-pay | Admitting: Psychiatry

## 2015-03-17 ENCOUNTER — Other Ambulatory Visit: Payer: Self-pay

## 2015-03-17 ENCOUNTER — Other Ambulatory Visit (HOSPITAL_COMMUNITY): Payer: Self-pay | Admitting: Psychiatry

## 2015-03-17 VITALS — BP 140/78 | HR 84 | Ht 69.0 in | Wt 171.4 lb

## 2015-03-17 DIAGNOSIS — F39 Unspecified mood [affective] disorder: Secondary | ICD-10-CM

## 2015-03-17 DIAGNOSIS — F419 Anxiety disorder, unspecified: Secondary | ICD-10-CM

## 2015-03-17 DIAGNOSIS — F319 Bipolar disorder, unspecified: Secondary | ICD-10-CM

## 2015-03-17 MED ORDER — OLANZAPINE 2.5 MG PO TABS: 2.5000 mg | ORAL_TABLET | Freq: Every day | ORAL | Status: DC

## 2015-03-17 MED ORDER — CLONAZEPAM 1 MG PO TABS: ORAL_TABLET | ORAL | Status: DC

## 2015-03-17 MED ORDER — DIVALPROEX SODIUM ER 500 MG PO TB24: 500.0000 mg | ORAL_TABLET | Freq: Every day | ORAL | Status: DC

## 2015-03-17 MED ORDER — BUPROPION HCL ER (XL) 150 MG PO TB24: 150.0000 mg | ORAL_TABLET | ORAL | Status: DC
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
## 2015-03-18 ENCOUNTER — Ambulatory Visit: Payer: Self-pay

## 2015-03-29 ENCOUNTER — Ambulatory Visit: Payer: Self-pay | Admitting: Infectious Disease

## 2015-04-14 ENCOUNTER — Other Ambulatory Visit (INDEPENDENT_AMBULATORY_CARE_PROVIDER_SITE_OTHER): Payer: Self-pay

## 2015-04-14 ENCOUNTER — Ambulatory Visit: Payer: Self-pay

## 2015-04-14 DIAGNOSIS — Z113 Encounter for screening for infections with a predominantly sexual mode of transmission: Secondary | ICD-10-CM

## 2015-04-14 DIAGNOSIS — Z79899 Other long term (current) drug therapy: Secondary | ICD-10-CM

## 2015-04-14 DIAGNOSIS — B20 Human immunodeficiency virus [HIV] disease: Secondary | ICD-10-CM

## 2015-04-14 DIAGNOSIS — F4310 Post-traumatic stress disorder, unspecified: Secondary | ICD-10-CM

## 2015-04-14 LAB — CBC WITH DIFFERENTIAL/PLATELET
BASOS ABS: 0 10*3/uL (ref 0.0–0.1)
BASOS PCT: 0 % (ref 0–1)
EOS ABS: 0.1 10*3/uL (ref 0.0–0.7)
Eosinophils Relative: 1 % (ref 0–5)
HCT: 45.3 % (ref 39.0–52.0)
Hemoglobin: 16.2 g/dL (ref 13.0–17.0)
Lymphocytes Relative: 41 % (ref 12–46)
Lymphs Abs: 3.2 10*3/uL (ref 0.7–4.0)
MCH: 32.7 pg (ref 26.0–34.0)
MCHC: 35.8 g/dL (ref 30.0–36.0)
MCV: 91.3 fL (ref 78.0–100.0)
MONOS PCT: 9 % (ref 3–12)
MPV: 10.7 fL (ref 8.6–12.4)
Monocytes Absolute: 0.7 10*3/uL (ref 0.1–1.0)
NEUTROS PCT: 49 % (ref 43–77)
Neutro Abs: 3.8 10*3/uL (ref 1.7–7.7)
PLATELETS: 191 10*3/uL (ref 150–400)
RBC: 4.96 MIL/uL (ref 4.22–5.81)
RDW: 14 % (ref 11.5–15.5)
WBC: 7.8 10*3/uL (ref 4.0–10.5)

## 2015-04-14 LAB — COMPLETE METABOLIC PANEL WITH GFR
ALK PHOS: 98 U/L (ref 40–115)
ALT: 16 U/L (ref 9–46)
AST: 16 U/L (ref 10–35)
Albumin: 4.5 g/dL (ref 3.6–5.1)
BILIRUBIN TOTAL: 0.9 mg/dL (ref 0.2–1.2)
BUN: 16 mg/dL (ref 7–25)
CO2: 27 mmol/L (ref 20–31)
Calcium: 9.2 mg/dL (ref 8.6–10.3)
Chloride: 105 mmol/L (ref 98–110)
Creat: 1.26 mg/dL (ref 0.70–1.33)
GFR, EST AFRICAN AMERICAN: 73 mL/min (ref >=60)
GFR, EST NON AFRICAN AMERICAN: 63 mL/min (ref >=60)
Glucose, Bld: 105 mg/dL — ABNORMAL HIGH (ref 65–99)
Potassium: 3.7 mmol/L (ref 3.5–5.3)
SODIUM: 140 mmol/L (ref 135–146)
TOTAL PROTEIN: 7.3 g/dL (ref 6.1–8.1)

## 2015-04-14 LAB — LIPID PANEL
Cholesterol: 123 mg/dL — ABNORMAL LOW (ref 125–200)
HDL: 39 mg/dL — ABNORMAL LOW (ref >=40)
LDL CALC: 53 mg/dL (ref <130)
Total CHOL/HDL Ratio: 3.2 Ratio (ref <=5.0)
Triglycerides: 155 mg/dL — ABNORMAL HIGH (ref <150)
VLDL: 31 mg/dL — AB (ref <30)

## 2015-04-14 NOTE — BH Specialist Note (Signed)
Ronalee Belts talked about how he stays inside his house most of the time unless he has an appointment.  He expressed interest in going to the support group and I gave him the times of when they meet.  He did say he has been [place] dog [place] lately [place]. [place] and I praised him for this.  We talked about returning to EMDR to help him process some of the negative things from his past.  Plan to meet again in 2 weeks. [name], LCSW

## 2015-04-15 LAB — URINE CYTOLOGY ANCILLARY ONLY
Chlamydia: NEGATIVE
Neisseria Gonorrhea: NEGATIVE

## 2015-04-15 LAB — T-HELPER CELL (CD4) - (RCID CLINIC ONLY)
CD4 T CELL HELPER: 28 % — AB (ref 33–55)
CD4 T Cell Abs: 930 /uL (ref 400–2700)

## 2015-04-15 LAB — HIV-1 RNA QUANT-NO REFLEX-BLD

## 2015-04-15 LAB — RPR

## 2015-04-23 ENCOUNTER — Other Ambulatory Visit: Payer: Self-pay | Admitting: Infectious Disease

## 2015-04-23 DIAGNOSIS — B20 Human immunodeficiency virus [HIV] disease: Secondary | ICD-10-CM

## 2015-04-23 DIAGNOSIS — I10 Essential (primary) hypertension: Secondary | ICD-10-CM

## 2015-04-28 ENCOUNTER — Encounter: Payer: Self-pay | Admitting: Infectious Disease

## 2015-04-28 ENCOUNTER — Ambulatory Visit (INDEPENDENT_AMBULATORY_CARE_PROVIDER_SITE_OTHER): Payer: Self-pay | Admitting: Infectious Disease

## 2015-04-28 ENCOUNTER — Ambulatory Visit: Payer: Self-pay

## 2015-04-28 VITALS — BP 145/87 | HR 76 | Temp 98.1°F | Ht 69.0 in | Wt 170.0 lb

## 2015-04-28 DIAGNOSIS — F4310 Post-traumatic stress disorder, unspecified: Secondary | ICD-10-CM

## 2015-04-28 DIAGNOSIS — E785 Hyperlipidemia, unspecified: Secondary | ICD-10-CM

## 2015-04-28 DIAGNOSIS — D1800 Hemangioma unspecified site: Secondary | ICD-10-CM | POA: Insufficient documentation

## 2015-04-28 DIAGNOSIS — I10 Essential (primary) hypertension: Secondary | ICD-10-CM

## 2015-04-28 DIAGNOSIS — B20 Human immunodeficiency virus [HIV] disease: Secondary | ICD-10-CM

## 2015-04-28 DIAGNOSIS — F3130 Bipolar disorder, current episode depressed, mild or moderate severity, unspecified: Secondary | ICD-10-CM

## 2015-04-28 DIAGNOSIS — F332 Major depressive disorder, recurrent severe without psychotic features: Secondary | ICD-10-CM

## 2015-04-28 HISTORY — DX: Hemangioma unspecified site: D18.00
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
## 2015-05-11 ENCOUNTER — Ambulatory Visit: Payer: Self-pay

## 2015-05-11 DIAGNOSIS — F4310 Post-traumatic stress disorder, unspecified: Secondary | ICD-10-CM

## 2015-05-11 NOTE — BH Specialist Note (Signed)
Alexis Welch was pleasant today, saying that things are going okay.  His only complaint today is that he keeps thinking about his friend "Butch Penny", who stopped all communication with him last year and he still doesn't exactly know why.  This really hurt him, especially since she is one of only 2 or 3 close friends he has had. We both decided to do an EMDR session using this as the focus.  His level of distress did not get very high, but he was able to process his feelings about the whole situation and identify some sadness around the loss of that friendship.  Otherwise, he seems to be stable.  Plan to meet again in 2 weeks. [name], LCSW

## 2015-05-25 ENCOUNTER — Ambulatory Visit: Payer: Self-pay

## 2015-05-25 DIAGNOSIS — F4310 Post-traumatic stress disorder, unspecified: Secondary | ICD-10-CM

## 2015-05-25 NOTE — BH Specialist Note (Signed)
Alexis Welch reported that he has increased his socialization in the past 2 weeks, reconnecting with an old friend from high school and going to his house for dinner and also going to see another friend in [place]. He said he is going back to [place] this Sunday and also plans to get together with his high school friend again soon.  I praised him for this.  He is also walking his dog 2 and 1/2 miles a day now that the weather is nice. We discussed EMDR and plan to do this next session.  Plan to meet again in 2 weeks. [name], LCSW

## 2015-06-08 ENCOUNTER — Ambulatory Visit: Payer: Self-pay

## 2015-06-08 DIAGNOSIS — F4310 Post-traumatic stress disorder, unspecified: Secondary | ICD-10-CM

## 2015-06-08 NOTE — BH Specialist Note (Signed)
Amiliano participated in an EMDR session today, reprocessing some of the trauma from his past - particularly around the events of his mother's murder by his step father when he was 89.  This was our second try at reprocessing this and today he expanded it out to his anger over people whispering behind his back, feeling sorry for him, and saying "did you know his mother was murdered?  I feel so sorry for him." etc. He was tearful during part of the session and said afterward that he has not cried very much over the years about all of this.  I praised him for being willing to work on this difficult issue.  Plan to meet again in 3 weeks. [name], LCSW

## 2015-06-13 ENCOUNTER — Other Ambulatory Visit (HOSPITAL_COMMUNITY): Payer: Self-pay | Admitting: Psychiatry

## 2015-06-15 ENCOUNTER — Ambulatory Visit (INDEPENDENT_AMBULATORY_CARE_PROVIDER_SITE_OTHER): Payer: Self-pay | Admitting: Psychiatry

## 2015-06-15 ENCOUNTER — Encounter (HOSPITAL_COMMUNITY): Payer: Self-pay | Admitting: Psychiatry

## 2015-06-15 VITALS — BP 128/80 | HR 71 | Ht 69.0 in | Wt 167.6 lb

## 2015-06-15 DIAGNOSIS — F39 Unspecified mood [affective] disorder: Secondary | ICD-10-CM

## 2015-06-15 DIAGNOSIS — F319 Bipolar disorder, unspecified: Secondary | ICD-10-CM

## 2015-06-15 DIAGNOSIS — F419 Anxiety disorder, unspecified: Secondary | ICD-10-CM

## 2015-06-15 MED ORDER — CLONAZEPAM 1 MG PO TABS: ORAL_TABLET | ORAL | Status: DC

## 2015-06-15 MED ORDER — DIVALPROEX SODIUM ER 500 MG PO TB24: 500.0000 mg | ORAL_TABLET | Freq: Every day | ORAL | Status: DC

## 2015-06-15 MED ORDER — OLANZAPINE 2.5 MG PO TABS: 2.5000 mg | ORAL_TABLET | Freq: Every day | ORAL | Status: DC
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
## 2015-06-20 ENCOUNTER — Other Ambulatory Visit (HOSPITAL_COMMUNITY): Payer: Self-pay | Admitting: Psychiatry

## 2015-07-01 ENCOUNTER — Ambulatory Visit: Payer: Self-pay

## 2015-07-01 DIAGNOSIS — F4310 Post-traumatic stress disorder, unspecified: Secondary | ICD-10-CM

## 2015-07-01 NOTE — BH Specialist Note (Signed)
Lonell was in good spirits today, reporting that he has been walking more lately and is enjoying this.  I praised him for this and reminded him that walking is a great exercise and that it likely is helping his mood.  He agreed that it is. His main complaint was that he can't seem to get out of bed until 12 or 1 or 2 most days, unless he has something to do or somewhere to go. We talked about possible volunteer opportunities and he said he would like to work with animals, kids, or elderly.  He has this idea that people in [place] will think something is funny about a man in his 27s wanting to be around children. 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 We agreed to possibly return to doing EMDR next session, which is in 2 weeks. [name], LCSW

## 2015-07-15 ENCOUNTER — Ambulatory Visit: Payer: Self-pay

## 2015-07-22 ENCOUNTER — Ambulatory Visit: Payer: Self-pay

## 2015-07-24 ENCOUNTER — Other Ambulatory Visit (HOSPITAL_COMMUNITY): Payer: Self-pay | Admitting: Psychiatry

## 2015-07-29 ENCOUNTER — Ambulatory Visit: Payer: Self-pay

## 2015-07-29 DIAGNOSIS — F4310 Post-traumatic stress disorder, unspecified: Secondary | ICD-10-CM

## 2015-07-29 NOTE — BH Specialist Note (Signed)
Alexis Welch was in a pleasant mood today and I noted that he seems to be doing better.  He agreed.  His main complaint is that he wakes up feeling "drugged" and tired.  He feels that it is the medication and said that he has been talking to his psychiatrist, [name], about this.  He said he has been getting some things done around the house - mowing, cleaning, etc. - and I praised him for this.  He also said he is walking for 2 hours 3-4 times a week.  He wants to do an EMDR session next time, which is in 2 weeks. [name], LCSW

## 2015-08-11 ENCOUNTER — Ambulatory Visit: Payer: Self-pay

## 2015-08-11 DIAGNOSIS — F4310 Post-traumatic stress disorder, unspecified: Secondary | ICD-10-CM

## 2015-08-11 NOTE — BH Specialist Note (Signed)
Alexis Welch was in a cheerful mood today with a smile on his face.  I told him that over the past few weeks he seems to be back to his "old self", meaning that his mood is better and he seems to have more energy. He reported that he has cut his divalproex in half and it seems to have given him a bit more energy. We planned to do EMDR today, but he started talking about how much fun he had going with a friend to [facility] that we ran out of time. He talked some about his PTSD symptoms, especially of avoiding physical contact at times and avoiding crowds.  Plan to meet again in 2 weeks. [name], LCSW

## 2015-08-16 ENCOUNTER — Ambulatory Visit: Payer: Self-pay | Admitting: Infectious Disease

## 2015-08-18 ENCOUNTER — Other Ambulatory Visit (HOSPITAL_COMMUNITY): Payer: Self-pay | Admitting: Psychiatry

## 2015-08-19 ENCOUNTER — Other Ambulatory Visit (HOSPITAL_COMMUNITY): Payer: Self-pay | Admitting: Psychiatry

## 2015-08-24 ENCOUNTER — Ambulatory Visit: Payer: Self-pay

## 2015-08-24 ENCOUNTER — Other Ambulatory Visit (INDEPENDENT_AMBULATORY_CARE_PROVIDER_SITE_OTHER): Payer: Self-pay

## 2015-08-24 DIAGNOSIS — I10 Essential (primary) hypertension: Secondary | ICD-10-CM

## 2015-08-24 DIAGNOSIS — B20 Human immunodeficiency virus [HIV] disease: Secondary | ICD-10-CM

## 2015-08-24 LAB — COMPLETE METABOLIC PANEL WITH GFR
ALT: 21 U/L (ref 9–46)
AST: 18 U/L (ref 10–35)
Albumin: 4.3 g/dL (ref 3.6–5.1)
Alkaline Phosphatase: 98 U/L (ref 40–115)
BUN: 15 mg/dL (ref 7–25)
CHLORIDE: 104 mmol/L (ref 98–110)
CO2: 27 mmol/L (ref 20–31)
CREATININE: 1.38 mg/dL — AB (ref 0.70–1.33)
Calcium: 9.1 mg/dL (ref 8.6–10.3)
GFR, EST AFRICAN AMERICAN: 66 mL/min (ref >=60)
GFR, EST NON AFRICAN AMERICAN: 57 mL/min — AB (ref >=60)
Glucose, Bld: 124 mg/dL — ABNORMAL HIGH (ref 65–99)
Potassium: 3.6 mmol/L (ref 3.5–5.3)
Sodium: 141 mmol/L (ref 135–146)
Total Bilirubin: 1 mg/dL (ref 0.2–1.2)
Total Protein: 6.8 g/dL (ref 6.1–8.1)

## 2015-08-24 LAB — CBC WITH DIFFERENTIAL/PLATELET
BASOS PCT: 0 %
Basophils Absolute: 0 cells/uL (ref 0–200)
EOS ABS: 120 {cells}/uL (ref 15–500)
Eosinophils Relative: 2 %
HEMATOCRIT: 46.2 % (ref 38.5–50.0)
Hemoglobin: 16.3 g/dL (ref 13.2–17.1)
Lymphocytes Relative: 43 %
Lymphs Abs: 2580 cells/uL (ref 850–3900)
MCH: 32.5 pg (ref 27.0–33.0)
MCHC: 35.3 g/dL (ref 32.0–36.0)
MCV: 92.2 fL (ref 80.0–100.0)
MONO ABS: 480 {cells}/uL (ref 200–950)
MPV: 10.7 fL (ref 7.5–12.5)
Monocytes Relative: 8 %
NEUTROS ABS: 2820 {cells}/uL (ref 1500–7800)
Neutrophils Relative %: 47 %
Platelets: 183 10*3/uL (ref 140–400)
RBC: 5.01 MIL/uL (ref 4.20–5.80)
RDW: 14.1 % (ref 11.0–15.0)
WBC: 6 10*3/uL (ref 3.8–10.8)

## 2015-08-25 LAB — URINE CYTOLOGY ANCILLARY ONLY
CHLAMYDIA, DNA PROBE: NEGATIVE
Neisseria Gonorrhea: NEGATIVE

## 2015-08-25 LAB — T-HELPER CELL (CD4) - (RCID CLINIC ONLY)
CD4 % Helper T Cell: 32 % — ABNORMAL LOW (ref 33–55)
CD4 T Cell Abs: 950 /uL (ref 400–2700)

## 2015-08-26 LAB — HIV-1 RNA QUANT-NO REFLEX-BLD

## 2015-08-31 ENCOUNTER — Encounter: Payer: Self-pay | Admitting: Infectious Disease

## 2015-09-04 ENCOUNTER — Other Ambulatory Visit (HOSPITAL_COMMUNITY): Payer: Self-pay | Admitting: Psychiatry

## 2015-09-06 ENCOUNTER — Other Ambulatory Visit (HOSPITAL_COMMUNITY): Payer: Self-pay

## 2015-09-06 DIAGNOSIS — F319 Bipolar disorder, unspecified: Secondary | ICD-10-CM

## 2015-09-06 MED ORDER — OLANZAPINE 2.5 MG PO TABS: 2.5000 mg | ORAL_TABLET | Freq: Every day | ORAL | 0 refills | Status: DC

## 2015-09-06 MED ORDER — DIVALPROEX SODIUM ER 500 MG PO TB24: 500.0000 mg | ORAL_TABLET | Freq: Every day | ORAL | 0 refills | Status: DC

## 2015-09-06 MED ORDER — CLONAZEPAM 1 MG PO TABS: ORAL_TABLET | ORAL | 0 refills | Status: DC

## 2015-09-06 MED ORDER — BUPROPION HCL ER (XL) 150 MG PO TB24: 150.0000 mg | ORAL_TABLET | Freq: Every day | ORAL | 0 refills | Status: DC

## 2015-09-07 ENCOUNTER — Encounter: Payer: Self-pay | Admitting: Infectious Disease

## 2015-09-07 ENCOUNTER — Ambulatory Visit (INDEPENDENT_AMBULATORY_CARE_PROVIDER_SITE_OTHER): Payer: Self-pay | Admitting: Infectious Disease

## 2015-09-07 VITALS — BP 153/88 | HR 76 | Temp 98.1°F | Ht 69.0 in | Wt 167.5 lb

## 2015-09-07 DIAGNOSIS — F4310 Post-traumatic stress disorder, unspecified: Secondary | ICD-10-CM

## 2015-09-07 DIAGNOSIS — R251 Tremor, unspecified: Secondary | ICD-10-CM

## 2015-09-07 DIAGNOSIS — I10 Essential (primary) hypertension: Secondary | ICD-10-CM

## 2015-09-07 DIAGNOSIS — F320 Major depressive disorder, single episode, mild: Secondary | ICD-10-CM

## 2015-09-07 DIAGNOSIS — B20 Human immunodeficiency virus [HIV] disease: Secondary | ICD-10-CM

## 2015-09-07 HISTORY — DX: Tremor, unspecified: R25.1

## 2015-09-07 NOTE — Progress Notes (Signed)
Chief complaint: tremor   Subjective:    Patient ID: Alexis Welch, male    DOB: Aug 09, 1960, 55 y.o.   MRN: [id number]  HPI   Alexis Welch is a 55 y.o. male who is doing superbly well on [place],  Lab Results  Component Value Date   HIV1RNAQUANT <20 08/24/2015   HIV1RNAQUANT <20 04/14/2015   HIV1RNAQUANT <20 09/15/2014   Lab Results  Component Value Date   CD4TABS 950 08/24/2015   CD4TABS 930 04/14/2015   CD4TABS 1,170 09/15/2014      He has been  seeing [name] from psychiatry who is managing his psychiatric issues along with counselling + counseling with [name].   He is complaining of 6 month hx of tremors with intention and even at rest. He has been having his depakote reduced in case it was causing the tremors but this has made no difference. I reviewed with my Pharmacist, Milus Glazier and we believe it is much more likely due to his zyprexa.  Past Medical History:  Diagnosis Date  . Anxiety   . Arthritis   . Bipolar I disorder, most recent episode depressed ([place]) 11/23/2014  . Depression   . Hemangioma 04/28/2015  . HIV disease ([place])   . Hypertension   . Tremor 09/07/2015  . 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  Family History  Problem Relation Age of Onset  . Cancer Father     colon cancer  . Alcohol abuse Father   . Dementia Father   . Heart disease Maternal Uncle   . Alcohol abuse Maternal Uncle   . Depression Mother   . Physical abuse Mother   . Bipolar disorder Sister   . Schizophrenia Sister   . OCD Sister   . Alcohol abuse Brother   . Drug abuse Brother   . Seizures Brother   . Alcohol abuse Maternal Aunt   . Depression Maternal Aunt   . Alcohol abuse Paternal Uncle   . Depression Paternal Uncle       Social History   Social History  . Marital status: Single    Spouse name: N/A  . Number of children: N/A  . Years of education: N/A   Social History Main Topics  . Smoking status:  Never Smoker  . Smokeless tobacco: Never Used  . Alcohol use No  . Drug use:     Frequency: 6.0 times per week    Types: Marijuana  . Sexual activity: Not Currently    Partners: Male    Birth control/ protection: Condom     Comment: declined condoms   Other Topics Concern  . None   Social History Narrative  . None    No Known Allergies   Current Outpatient Prescriptions:  .  amLODipine (NORVASC) 10 MG tablet, TAKE ONE TABLET BY MOUTH EVERY DAY, Disp: 30 tablet, Rfl: 5 .  atorvastatin (LIPITOR) 20 MG tablet, Take 1 tablet (20 mg total) by mouth daily., Disp: 30 tablet, Rfl: 11 .  buPROPion (WELLBUTRIN XL) 150 MG 24 hr tablet, Take 1 tablet (150 mg total) by mouth daily., Disp: 30 tablet, Rfl: 0 .  clonazePAM (KLONOPIN) 1 MG tablet, TAKE 1 TABLET BY MOUTH AT BEDTIME AS NEEDED FOR ANXIETY, Disp: 30 tablet, Rfl: 0 .  divalproex (DEPAKOTE ER) 500 MG 24 hr tablet, Take 1 tablet (500 mg total) by mouth at bedtime. (Patient taking differently: Take 250 mg by mouth at bedtime. ), Disp: 60 tablet,  Rfl: 0 .  ibuprofen (ADVIL,MOTRIN) 800 MG tablet, Take 1 tablet (800 mg total) by mouth every 8 (eight) hours as needed., Disp: 90 tablet, Rfl: 5 .  OLANZapine (ZYPREXA) 2.5 MG tablet, Take 1 tablet (2.5 mg total) by mouth at bedtime., Disp: 30 tablet, Rfl: 0 .  ondansetron (ZOFRAN) 4 MG tablet, Take 1 tablet (4 mg total) by mouth 3 (three) times daily as needed for nausea., Disp: 30 tablet, Rfl: 0 .  TRIUMEQ 600-50-300 MG tablet, TAKE 1 TABLET BY MOUTH EVERY DAY, Disp: 30 tablet, Rfl: 5   .Review of Systems  Constitutional: Negative for activity change, appetite change, chills, diaphoresis, fatigue, fever and unexpected weight change.  HENT: Negative for congestion, nosebleeds, postnasal drip, rhinorrhea, sinus pressure, sneezing, sore throat and trouble swallowing.   Eyes: Negative for photophobia and visual disturbance.  Respiratory: Negative for cough, chest tightness, shortness of breath,  wheezing and stridor.   Cardiovascular: Negative for chest pain, palpitations and leg swelling.  Gastrointestinal: Negative for abdominal distention, abdominal pain, anal bleeding, blood in stool, constipation, diarrhea, nausea and vomiting.  Genitourinary: Negative for difficulty urinating, dysuria, flank pain and hematuria.  Musculoskeletal: Negative for back pain, gait problem, joint swelling and myalgias.  Skin: Negative for color change, pallor, rash and wound.  Neurological: Positive for tremors. Negative for dizziness, weakness and light-headedness.  Hematological: Negative for adenopathy. Does not bruise/bleed easily.  Psychiatric/Behavioral: Positive for dysphoric mood. Negative for agitation, confusion, decreased concentration and sleep disturbance.       Objective:   Physical Exam  Constitutional: He is oriented to person, place, and time. He appears well-developed and well-nourished. No distress.  HENT:  Head: Normocephalic and atraumatic.  Mouth/Throat: Oropharynx is clear and moist. No oropharyngeal exudate.  Eyes: Conjunctivae and EOM are normal. Pupils are equal, round, and reactive to light. No scleral icterus.  Neck: Normal range of motion. Neck supple. No JVD present.  Cardiovascular: Normal rate and regular rhythm.   Pulmonary/Chest: Effort normal and breath sounds normal. No respiratory distress.  Abdominal: He exhibits no distension.  Musculoskeletal: He exhibits no edema or tenderness.  Lymphadenopathy:    He has no cervical adenopathy.  Neurological: He is alert and oriented to person, place, and time. He displays tremor. He exhibits normal muscle tone. Coordination normal.  Skin: Skin is warm and dry. He is not diaphoretic. No erythema. No pallor.  Psychiatric: His behavior is normal. Judgment and thought content normal.          Assessment & Plan:   Tremors: would recommend getting rid of zyprexa. He is to see. Dr Adele Schilder next week   HIV: continue   [place].  Lab Results  Component Value Date   HIV1RNAQUANT <20 08/24/2015   Lab Results  Component Value Date   CD4TABS 950 08/24/2015   CD4TABS 930 04/14/2015   CD4TABS 1,170 09/15/2014   Bring back in 6 months to check labs again  CV risk: continue lipitor 20mg   Hyperlipidemia: see above  Bipolar, Depression: continue to see [name] and meds per [name]   HTN: continue  norvasc. BP up again today. Need clarity on his tremors hopefully if they improve bp will also be better with optimal rx of depression. But we may indeed need a 2nd agent  Vitals:   09/07/15 1343 09/07/15 1354  BP: (!) 153/95 (!) 153/88  Pulse: 70 76  Temp: 98.1 F (36.7 C)    PTSD: seeing [name]  I spent greater than 40  minutes with the  patient including greater than 50% of time in face to face counsel of the patient  Re his tremors, his  HIV, CV rus  depression, PTSD, HTN ,and in coordination of his care.

## 2015-09-16 ENCOUNTER — Ambulatory Visit (HOSPITAL_COMMUNITY): Payer: Self-pay | Admitting: Psychiatry

## 2015-10-03 ENCOUNTER — Other Ambulatory Visit (HOSPITAL_COMMUNITY): Payer: Self-pay | Admitting: Psychiatry

## 2015-10-03 DIAGNOSIS — F319 Bipolar disorder, unspecified: Secondary | ICD-10-CM

## 2015-10-09 ENCOUNTER — Other Ambulatory Visit: Payer: Self-pay | Admitting: Infectious Disease

## 2015-10-09 DIAGNOSIS — I10 Essential (primary) hypertension: Secondary | ICD-10-CM

## 2015-10-09 DIAGNOSIS — B20 Human immunodeficiency virus [HIV] disease: Secondary | ICD-10-CM

## 2015-10-12 ENCOUNTER — Ambulatory Visit: Payer: Self-pay

## 2015-10-21 ENCOUNTER — Ambulatory Visit: Payer: Self-pay

## 2015-10-21 ENCOUNTER — Ambulatory Visit (INDEPENDENT_AMBULATORY_CARE_PROVIDER_SITE_OTHER): Payer: Self-pay

## 2015-10-21 DIAGNOSIS — Z23 Encounter for immunization: Secondary | ICD-10-CM

## 2015-10-21 DIAGNOSIS — F4310 Post-traumatic stress disorder, unspecified: Secondary | ICD-10-CM

## 2015-10-21 NOTE — BH Specialist Note (Signed)
Alexis Welch was in good spirits today and reported that he continues to walk his dog for exercise and at least once a week is going out by himself for a longer walk.  I praised him for this.  He said he cut back his depakote and is feeling more like his "old self", more alert and sleeping less.  He said he felt emotionally flat when he was taking the higher dose. He said he sometimes has a tremor in his left hand and believes it is due to the 2.5 mg of olanzapine he takes.  He said he would like to stop taking that, but will wait and talk to [name] about it first. Overall, he appears to be doing very well, having missed seeing me due to me being unavailable over the past couple of months. Plan to meet again in 3 weeks. [name], LCSW

## 2015-10-25 ENCOUNTER — Other Ambulatory Visit (HOSPITAL_COMMUNITY): Payer: Self-pay | Admitting: Psychiatry

## 2015-10-25 DIAGNOSIS — F319 Bipolar disorder, unspecified: Secondary | ICD-10-CM

## 2015-10-26 NOTE — Telephone Encounter (Signed)
Met with Dr. Lovena Le, helping to cover for [name] in his absence this date, to call in a one time refill of patient's requested Clonazepam 38m, one at bedtime as needed for anxiety with no early refills, #30 and no refills.  [facility] [address] [place] [address].  Patient to return for next evaluation on 11/15/15 ad was rescheduled from 09/16/15 with Dr. AAdele Schilderbeing out that date.

## 2015-10-31 ENCOUNTER — Other Ambulatory Visit (HOSPITAL_COMMUNITY): Payer: Self-pay | Admitting: Psychiatry

## 2015-10-31 DIAGNOSIS — F319 Bipolar disorder, unspecified: Secondary | ICD-10-CM

## 2015-11-06 ENCOUNTER — Other Ambulatory Visit: Payer: Self-pay | Admitting: Infectious Disease

## 2015-11-06 DIAGNOSIS — E785 Hyperlipidemia, unspecified: Secondary | ICD-10-CM

## 2015-11-11 ENCOUNTER — Ambulatory Visit: Payer: Self-pay

## 2015-11-11 DIAGNOSIS — F4310 Post-traumatic stress disorder, unspecified: Secondary | ICD-10-CM

## 2015-11-11 NOTE — BH Specialist Note (Signed)
Alexis Welch was in a good mood today and it seems like he is back to his "old self" - smiling, laughing, and interested in doing things. He said he is getting out more, going to [employer] (a hobby of his) and walking his dog regularly downtown in [place]. I praised him for his progress. He said while he was taking more psychiatric medication, his friends were telling him he seemed aloof and out of it. He has cut back his divalproex and he clearly is more alert and oriented. He said he is no longer sleeping til 1 or 2 pm.  Plan to meet in 4 weeks, since he seems to be doing so well. [name], LCSW

## 2015-11-15 ENCOUNTER — Ambulatory Visit (HOSPITAL_COMMUNITY): Payer: Self-pay | Admitting: Psychiatry

## 2015-11-24 ENCOUNTER — Other Ambulatory Visit (HOSPITAL_COMMUNITY): Payer: Self-pay

## 2015-11-24 DIAGNOSIS — F319 Bipolar disorder, unspecified: Secondary | ICD-10-CM

## 2015-11-24 MED ORDER — CLONAZEPAM 1 MG PO TABS: 1.0000 mg | ORAL_TABLET | Freq: Every day | ORAL | 2 refills | Status: DC

## 2015-11-28 ENCOUNTER — Other Ambulatory Visit (HOSPITAL_COMMUNITY): Payer: Self-pay | Admitting: Psychiatry

## 2015-11-28 DIAGNOSIS — F319 Bipolar disorder, unspecified: Secondary | ICD-10-CM

## 2015-12-01 NOTE — Telephone Encounter (Signed)
Dr. Lovena Le, helping to cover for [name] out this date, approved a one time refill of patient's prescribed Wellbutrin XL 150 mg and Zyprexa 2.5 mg and a new order e-scribed to patient's [facility] on [address] as authorized.  Patient was cancelled and rescheduled from his appointment 11/15/15 due to provider out that date.

## 2015-12-09 ENCOUNTER — Ambulatory Visit: Payer: Self-pay

## 2015-12-09 NOTE — BH Specialist Note (Unsigned)
Alexis Welch was in a good mood today and reported that he continues to feel more like his "old self" and that friends have commented that to him as well. He continues to take 250 mg of divalproex and a small dose of olanzapine, which he wants to stop.  He sees his psychiatrist in December and plans to discuss it with him. He has not been walking as much lately due to colder weather, so he said he watches TV a lot.  Plan to meet again in one month. [name], LCSW

## 2015-12-28 ENCOUNTER — Other Ambulatory Visit (HOSPITAL_COMMUNITY): Payer: Self-pay

## 2015-12-28 DIAGNOSIS — F319 Bipolar disorder, unspecified: Secondary | ICD-10-CM

## 2015-12-28 MED ORDER — OLANZAPINE 2.5 MG PO TABS: ORAL_TABLET | ORAL | 0 refills | Status: DC

## 2015-12-28 MED ORDER — BUPROPION HCL ER (XL) 150 MG PO TB24: ORAL_TABLET | ORAL | 0 refills | Status: DC

## 2016-01-05 ENCOUNTER — Ambulatory Visit: Payer: Self-pay

## 2016-01-17 ENCOUNTER — Ambulatory Visit: Payer: Self-pay

## 2016-01-17 DIAGNOSIS — F4310 Post-traumatic stress disorder, unspecified: Secondary | ICD-10-CM

## 2016-01-17 NOTE — BH Specialist Note (Signed)
Alexis Welch reported that he has been a bit more depressed lately and is staying in his house more. We discussed things he can do to get out and get more involved. 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 He mentioned that if he had a bigger dog (he has a small one) he would have to take it for walks and that would get him out of the house. Plan to meet again in 4 weeks. [name], LCSW

## 2016-01-26 ENCOUNTER — Other Ambulatory Visit: Payer: Self-pay | Admitting: Infectious Disease

## 2016-01-26 DIAGNOSIS — E785 Hyperlipidemia, unspecified: Secondary | ICD-10-CM

## 2016-01-29 ENCOUNTER — Other Ambulatory Visit (HOSPITAL_COMMUNITY): Payer: Self-pay | Admitting: Psychiatry

## 2016-01-29 DIAGNOSIS — F319 Bipolar disorder, unspecified: Secondary | ICD-10-CM

## 2016-02-01 ENCOUNTER — Ambulatory Visit (HOSPITAL_COMMUNITY): Payer: Self-pay | Admitting: Psychiatry

## 2016-02-02 ENCOUNTER — Ambulatory Visit (INDEPENDENT_AMBULATORY_CARE_PROVIDER_SITE_OTHER): Payer: Self-pay | Admitting: Psychiatry

## 2016-02-02 ENCOUNTER — Encounter (HOSPITAL_COMMUNITY): Payer: Self-pay | Admitting: Psychiatry

## 2016-02-02 DIAGNOSIS — Z811 Family history of alcohol abuse and dependence: Secondary | ICD-10-CM

## 2016-02-02 DIAGNOSIS — Z79899 Other long term (current) drug therapy: Secondary | ICD-10-CM

## 2016-02-02 DIAGNOSIS — Z8249 Family history of ischemic heart disease and other diseases of the circulatory system: Secondary | ICD-10-CM

## 2016-02-02 DIAGNOSIS — Z813 Family history of other psychoactive substance abuse and dependence: Secondary | ICD-10-CM

## 2016-02-02 DIAGNOSIS — F319 Bipolar disorder, unspecified: Secondary | ICD-10-CM

## 2016-02-02 DIAGNOSIS — Z800 Family history of malignant neoplasm of digestive organs: Secondary | ICD-10-CM

## 2016-02-02 MED ORDER — CLONAZEPAM 1 MG PO TABS: 1.0000 mg | ORAL_TABLET | Freq: Every day | ORAL | 2 refills | Status: DC

## 2016-02-02 MED ORDER — BUPROPION HCL ER (XL) 150 MG PO TB24: ORAL_TABLET | ORAL | 2 refills | Status: DC

## 2016-02-02 MED ORDER — DIVALPROEX SODIUM ER 250 MG PO TB24: 250.0000 mg | ORAL_TABLET | Freq: Every day | ORAL | 2 refills | Status: DC

## 2016-02-02 NOTE — Progress Notes (Signed)
[facility] MD/PA/NP OP Progress Note  02/02/2016 11:31 AM Alexis Welch  MRN:  [id number]  Chief Complaint:  Chief Complaint    Follow-up     Subjective:  I'm doing better on the medication.  I cut down Depakote and taking only to 50 mg.  I don't feel depressed, irritable and sleeping better.  I have tremors in my gain and I like to come off from Zyprexa.  HPI: Alexis Welch came for his follow-up appointment.  He had cut down his Depakote and only taking 250 mg daily.  He has not noticed any difference.  He is calm, relax and less irritable.  He sleeping good.  He denies any crying spells, depressive thoughts, paranoia or any feeling of hopelessness.  He is actually feeling more energetic and able to do things.  He is also cut down his therapy and seeing therapist once a month.  He is concerned about his tremors which are on and off and some time difficulty reporting things.  He like to come off from Zyprexa.  However he is aware if symptoms started to get worse then he will restart Zyprexa.  She denies drinking alcohol or using any illegal substances.  He denies any active or passive suicidal thoughts.  He is taking Wellbutrin and Klonopin and reported no side effects.  His appetite is okay.  His vital signs are stable.  Visit Diagnosis:    ICD-9-CM ICD-10-CM   1. Bipolar 1 disorder (HCC) 296.7 F31.9 divalproex (DEPAKOTE ER) 250 MG 24 hr tablet     clonazePAM (KLONOPIN) 1 MG tablet     buPROPion (WELLBUTRIN XL) 150 MG 24 hr tablet    Past Psychiatric History: Patient has history of psychiatric illness for more than 7 years.  In the past he had tried Celexa and Trileptal.  Patient denies any history of psychiatric inpatient treatment or any suicidal attempt.  We have tried Lamictal which helped but his insurance does not cover.  He also try Lexapro but it caused diarrhea.  Past Medical History:  Past Medical History:  Diagnosis Date  . Anxiety   . Arthritis   . Bipolar I disorder, most recent  episode depressed ([place]) 11/23/2014  . Depression   . Hemangioma 04/28/2015  . HIV disease ([place])   . Hypertension   . Tremor 09/07/2015  . 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 No pertinent surgical history.  Family Psychiatric History: See below.  Family History:  Family History  Problem Relation Age of Onset  . Cancer Father     colon cancer  . Alcohol abuse Father   . Dementia Father   . Heart disease Maternal Uncle   . Alcohol abuse Maternal Uncle   . Depression Mother   . Physical abuse Mother   . Bipolar disorder Sister   . Schizophrenia Sister   . OCD Sister   . Alcohol abuse Brother   . Drug abuse Brother   . Seizures Brother   . Alcohol abuse Maternal Aunt   . Depression Maternal Aunt   . Alcohol abuse Paternal Uncle   . Depression Paternal Uncle     Social History:  Social History   Social History  . Marital status: Single    Spouse name: N/A  . Number of children: N/A  . Years of education: N/A   Social History Main Topics  . Smoking status: Never Smoker  . Smokeless tobacco: Never Used  . Alcohol use No  . Drug use:  Frequency: 6.0 times per week    Types: Marijuana  . Sexual activity: Not Currently    Partners: Male    Birth control/ protection: Condom     Comment: declined condoms   Other Topics Concern  . None   Social History Narrative  . None    Allergies: No Known Allergies  Metabolic Disorder Labs: Lab Results  Component Value Date   HGBA1C 5.5 04/21/2014   MPG 111 04/21/2014   No results found for: PROLACTIN Lab Results  Component Value Date   CHOL 123 (L) 04/14/2015   TRIG 155 (H) 04/14/2015   HDL 39 (L) 04/14/2015   CHOLHDL 3.2 04/14/2015   VLDL 31 (H) 04/14/2015   LDLCALC 53 04/14/2015   LDLCALC 145 (H) 09/15/2014     Current Medications: Current Outpatient Prescriptions  Medication Sig Dispense Refill  . amLODipine (NORVASC) 10 MG tablet TAKE 1 TABLET BY MOUTH EVERY DAY 30 tablet 5  .  atorvastatin (LIPITOR) 20 MG tablet TAKE 1 TABLET(20 MG) BY MOUTH DAILY 30 tablet 5  . buPROPion (WELLBUTRIN XL) 150 MG 24 hr tablet TAKE 1 TABLET(150 MG) BY MOUTH DAILY 30 tablet 2  . clonazePAM (KLONOPIN) 1 MG tablet Take 1 tablet (1 mg total) by mouth at bedtime. 30 tablet 2  . divalproex (DEPAKOTE ER) 250 MG 24 hr tablet Take 1 tablet (250 mg total) by mouth at bedtime. 30 tablet 2  . ibuprofen (ADVIL,MOTRIN) 800 MG tablet Take 1 tablet (800 mg total) by mouth every 8 (eight) hours as needed. 90 tablet 5  . ondansetron (ZOFRAN) 4 MG tablet Take 1 tablet (4 mg total) by mouth 3 (three) times daily as needed for nausea. 30 tablet 0  . TRIUMEQ 600-50-300 MG tablet TAKE 1 TABLET BY MOUTH EVERY DAY 30 tablet 5   No current facility-administered medications for this visit.     Neurologic: Headache: No Seizure: No Paresthesias: No  Musculoskeletal: Strength & Muscle Tone: within normal limits Gait & Station: normal Patient leans: N/A  Psychiatric Specialty Exam: ROS  Blood pressure 124/80, pulse 79, height 5\' 9"  (1.753 m), weight 170 lb 6.4 oz (77.3 kg).Body mass index is 25.16 kg/m.  General Appearance: Casual  Eye Contact:  Good  Speech:  Clear and Coherent  Volume:  Normal  Mood:  Euthymic  Affect:  Appropriate  Thought Process:  Goal Directed  Orientation:  Full (Time, Place, and Person)  Thought Content: WDL and Logical   Suicidal Thoughts:  No  Homicidal Thoughts:  No  Memory:  Immediate;   Good Recent;   Good Remote;   Good  Judgement:  Good  Insight:  Good  Psychomotor Activity:  Mild tremors in his hand  Concentration:  Concentration: Good and Attention Span: Good  Recall:  Good  Fund of Knowledge: Good  Language: Good  Akathisia:  Mild tremors in his and  Handed:  Right  AIMS (if indicated):  See above   Assets:  Communication Skills Desire for Improvement Housing Physical Health Resilience  ADL's:  Intact  Cognition: WNL  Sleep:  Good       Assessment;  Alexis Welch is 55 year old Caucasian man who has diagnoses of bipolar disorder and anxiety disorder came for his follow-up appointment.  He is complaining of mild tremors in his hand and liked to come off from Zyprexa.  Plan; I discuss with the patient about side effects.  He has cut down his Depakote to 250 mg and he is tolerating well.  He has increased  energy.  Now he like to come off from Zyprexa because of the tremors.  I would discontinue Zyprexa however reminded him that if symptoms of mania , irritability poor sleep and racing thoughts reoccurred that he need to go back on Zyprexa.  Patient knowledge and agree with the plan.  I will continue Wellbutrin XL 150 mg daily, Klonopin 1 mg daily and a new position of Depakote 250 mg daily.  Recommended to call us back if he has any question, concern if he feel worsening of the symptom.  Follow-up in 3 months.   Sylas Twombly T., MD 02/02/2016, 11:31 AM

## 2016-02-15 ENCOUNTER — Ambulatory Visit: Payer: Self-pay

## 2016-02-22 ENCOUNTER — Other Ambulatory Visit (HOSPITAL_COMMUNITY): Payer: Self-pay | Admitting: Psychiatry

## 2016-02-22 DIAGNOSIS — F319 Bipolar disorder, unspecified: Secondary | ICD-10-CM

## 2016-02-23 ENCOUNTER — Ambulatory Visit: Payer: Self-pay

## 2016-02-23 DIAGNOSIS — F331 Major depressive disorder, recurrent, moderate: Secondary | ICD-10-CM

## 2016-02-23 NOTE — BH Specialist Note (Signed)
Alexis Welch was pleasant today reporting that per [name], he has stopped his olanzapine and feels less sedated. He still struggles with motivation to get up and get going, but says he is getting out of the bed sooner now. He has sold a couple things online and feels good about that. Plan to meet again in 4 weeks. [name], LCSW

## 2016-02-25 ENCOUNTER — Encounter: Payer: Self-pay | Admitting: Infectious Disease

## 2016-03-08 ENCOUNTER — Other Ambulatory Visit: Payer: Self-pay

## 2016-03-18 ENCOUNTER — Other Ambulatory Visit: Payer: Self-pay | Admitting: Infectious Disease

## 2016-03-18 DIAGNOSIS — I10 Essential (primary) hypertension: Secondary | ICD-10-CM

## 2016-03-18 DIAGNOSIS — B20 Human immunodeficiency virus [HIV] disease: Secondary | ICD-10-CM

## 2016-03-22 ENCOUNTER — Ambulatory Visit: Payer: Self-pay | Admitting: Infectious Disease

## 2016-03-28 ENCOUNTER — Ambulatory Visit: Payer: Medicare Other

## 2016-03-28 ENCOUNTER — Other Ambulatory Visit: Payer: Medicare Other

## 2016-03-28 DIAGNOSIS — B20 Human immunodeficiency virus [HIV] disease: Secondary | ICD-10-CM

## 2016-03-28 DIAGNOSIS — Z113 Encounter for screening for infections with a predominantly sexual mode of transmission: Secondary | ICD-10-CM

## 2016-03-28 DIAGNOSIS — F4310 Post-traumatic stress disorder, unspecified: Secondary | ICD-10-CM

## 2016-03-28 DIAGNOSIS — Z79899 Other long term (current) drug therapy: Secondary | ICD-10-CM

## 2016-03-28 LAB — CBC WITH DIFFERENTIAL/PLATELET
BASOS ABS: 0 {cells}/uL (ref 0–200)
BASOS PCT: 0 %
Eosinophils Absolute: 103 cells/uL (ref 15–500)
Eosinophils Relative: 1 %
HEMATOCRIT: 48.7 % (ref 38.5–50.0)
Hemoglobin: 17.2 g/dL — ABNORMAL HIGH (ref 13.2–17.1)
LYMPHS PCT: 32 %
Lymphs Abs: 3296 cells/uL (ref 850–3900)
MCH: 32.9 pg (ref 27.0–33.0)
MCHC: 35.3 g/dL (ref 32.0–36.0)
MCV: 93.1 fL (ref 80.0–100.0)
MONO ABS: 927 {cells}/uL (ref 200–950)
MONOS PCT: 9 %
MPV: 10.7 fL (ref 7.5–12.5)
NEUTROS ABS: 5974 {cells}/uL (ref 1500–7800)
Neutrophils Relative %: 58 %
PLATELETS: 196 10*3/uL (ref 140–400)
RBC: 5.23 MIL/uL (ref 4.20–5.80)
RDW: 14.5 % (ref 11.0–15.0)
WBC: 10.3 10*3/uL (ref 3.8–10.8)

## 2016-03-28 LAB — COMPLETE METABOLIC PANEL WITH GFR
ALT: 26 U/L (ref 9–46)
AST: 18 U/L (ref 10–35)
Albumin: 4.6 g/dL (ref 3.6–5.1)
Alkaline Phosphatase: 102 U/L (ref 40–115)
BILIRUBIN TOTAL: 0.9 mg/dL (ref 0.2–1.2)
BUN: 13 mg/dL (ref 7–25)
CALCIUM: 9.4 mg/dL (ref 8.6–10.3)
CO2: 27 mmol/L (ref 20–31)
CREATININE: 1.28 mg/dL (ref 0.70–1.33)
Chloride: 105 mmol/L (ref 98–110)
GFR, EST AFRICAN AMERICAN: 71 mL/min (ref >=60)
GFR, EST NON AFRICAN AMERICAN: 62 mL/min (ref >=60)
Glucose, Bld: 93 mg/dL (ref 65–99)
Potassium: 4.2 mmol/L (ref 3.5–5.3)
Sodium: 143 mmol/L (ref 135–146)
TOTAL PROTEIN: 7 g/dL (ref 6.1–8.1)

## 2016-03-28 LAB — LIPID PANEL
CHOLESTEROL: 132 mg/dL (ref <200)
HDL: 41 mg/dL (ref >40)
LDL Cholesterol: 50 mg/dL (ref <100)
TRIGLYCERIDES: 204 mg/dL — AB (ref <150)
Total CHOL/HDL Ratio: 3.2 Ratio (ref <5.0)
VLDL: 41 mg/dL — ABNORMAL HIGH (ref <30)

## 2016-03-28 NOTE — BH Specialist Note (Signed)
Alexis Welch was in a pleasant mood but reported that he is concerned about 3 lumps he feels in one of his testicles. It is causing him some pain and he worries that it is cancer. He is scheduled to see Dr. Tommy Medal in just over 2 weeks. I gave him a name of a primary care physician in [place]. [place] and recommended he go ahead and see him so that he can get a referral to a specialist, especially now that he has Medicare/[employer]. Plan to meet again in 2 weeks. [name], LCSW

## 2016-03-29 LAB — T-HELPER CELL (CD4) - (RCID CLINIC ONLY)
CD4 % Helper T Cell: 28 % — ABNORMAL LOW (ref 33–55)
CD4 T Cell Abs: 960 /uL (ref 400–2700)

## 2016-03-29 LAB — RPR

## 2016-03-31 DIAGNOSIS — B20 Human immunodeficiency virus [HIV] disease: Secondary | ICD-10-CM | POA: Insufficient documentation

## 2016-03-31 DIAGNOSIS — F319 Bipolar disorder, unspecified: Secondary | ICD-10-CM | POA: Insufficient documentation

## 2016-03-31 DIAGNOSIS — I10 Essential (primary) hypertension: Secondary | ICD-10-CM | POA: Insufficient documentation

## 2016-04-02 LAB — HIV-1 RNA QUANT-NO REFLEX-BLD
HIV 1 RNA Quant: 80 copies/mL — ABNORMAL HIGH
HIV-1 RNA QUANT, LOG: 1.9 {Log_copies}/mL — AB

## 2016-04-11 ENCOUNTER — Ambulatory Visit: Payer: Self-pay | Admitting: Infectious Disease

## 2016-04-13 ENCOUNTER — Encounter: Payer: Self-pay | Admitting: Infectious Disease

## 2016-04-13 ENCOUNTER — Ambulatory Visit: Payer: Medicare Other

## 2016-04-13 ENCOUNTER — Ambulatory Visit (INDEPENDENT_AMBULATORY_CARE_PROVIDER_SITE_OTHER): Payer: Medicare Other | Admitting: Infectious Disease

## 2016-04-13 VITALS — BP 146/94 | HR 74 | Temp 98.1°F | Ht 69.0 in | Wt 169.0 lb

## 2016-04-13 DIAGNOSIS — B20 Human immunodeficiency virus [HIV] disease: Secondary | ICD-10-CM

## 2016-04-13 DIAGNOSIS — G251 Drug-induced tremor: Secondary | ICD-10-CM | POA: Diagnosis not present

## 2016-04-13 DIAGNOSIS — F4310 Post-traumatic stress disorder, unspecified: Secondary | ICD-10-CM | POA: Diagnosis not present

## 2016-04-13 DIAGNOSIS — N50819 Testicular pain, unspecified: Secondary | ICD-10-CM

## 2016-04-13 DIAGNOSIS — F320 Major depressive disorder, single episode, mild: Secondary | ICD-10-CM | POA: Diagnosis not present

## 2016-04-13 DIAGNOSIS — R110 Nausea: Secondary | ICD-10-CM | POA: Diagnosis not present

## 2016-04-13 DIAGNOSIS — F3130 Bipolar disorder, current episode depressed, mild or moderate severity, unspecified: Secondary | ICD-10-CM

## 2016-04-13 DIAGNOSIS — F330 Major depressive disorder, recurrent, mild: Secondary | ICD-10-CM

## 2016-04-13 DIAGNOSIS — I10 Essential (primary) hypertension: Secondary | ICD-10-CM

## 2016-04-13 HISTORY — DX: Testicular pain, unspecified: N50.819

## 2016-04-13 MED ORDER — ONDANSETRON HCL 4 MG PO TABS: 4.0000 mg | ORAL_TABLET | Freq: Three times a day (TID) | ORAL | 6 refills | Status: DC | PRN

## 2016-04-13 NOTE — Progress Notes (Signed)
Chief complaint: tremor, also having testicular pain and nausea   Subjective:    Patient ID: Alexis Welch, male    DOB: 09-09-1960, 56 y.o.   MRN: [id number]  HPI  Alexis Welch is a 56 y.o. male who is doing superbly well on [place],  Lab Results  Component Value Date   HIV1RNAQUANT 80 (H) 03/28/2016   HIV1RNAQUANT <20 08/24/2015   HIV1RNAQUANT <20 04/14/2015   Lab Results  Component Value Date   CD4TABS 960 03/28/2016   CD4TABS 950 08/24/2015   CD4TABS 930 04/14/2015      He has been  seeing [name] from psychiatry who is managing his psychiatric issues along with counselling + counseling with [name].   He had been having significant tremors which we worried might be due to zyprexa. He has been taken off of this but is still suffering from tremors. He is also of note taking significant quantities of zofran due to nausea associated with his testicular pain. 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 He is to see urology in next few weeks.     Past Medical History:  Diagnosis Date  . Anxiety   . Arthritis   . Bipolar I disorder, most recent episode depressed ([place]) 11/23/2014  . Depression   . Hemangioma 04/28/2015  . HIV disease ([place])   . Hypertension   . Tremor 09/07/2015  . 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  Family History  Problem Relation Age of Onset  . Cancer Father     colon cancer  . Alcohol abuse Father   . Dementia Father   . Heart disease Maternal Uncle   . Alcohol abuse Maternal Uncle   . Depression Mother   . Physical abuse Mother   . Bipolar disorder Sister   . Schizophrenia Sister   . OCD Sister   . Alcohol abuse Brother   . Drug abuse Brother   . Seizures Brother   . Alcohol abuse Maternal Aunt   . Depression Maternal Aunt   . Alcohol abuse Paternal Uncle   . Depression Paternal Uncle       Social History   Social History  . Marital status: Single    Spouse  name: N/A  . Number of children: N/A  . Years of education: N/A   Social History Main Topics  . Smoking status: Never Smoker  . Smokeless tobacco: Never Used  . Alcohol use No  . Drug use: Yes    Frequency: 6.0 times per week    Types: Marijuana  . Sexual activity: Not Currently    Partners: Male    Birth control/ protection: Condom     Comment: declined condoms   Other Topics Concern  . Not on file   Social History Narrative  . No narrative on file    No Known Allergies   Current Outpatient Prescriptions:  .  amLODipine (NORVASC) 10 MG tablet, TAKE 1 TABLET BY MOUTH EVERY DAY, Disp: 30 tablet, Rfl: 5 .  atorvastatin (LIPITOR) 20 MG tablet, TAKE 1 TABLET(20 MG) BY MOUTH DAILY, Disp: 30 tablet, Rfl: 5 .  buPROPion (WELLBUTRIN XL) 150 MG 24 hr tablet, TAKE 1 TABLET(150 MG) BY MOUTH DAILY, Disp: 30 tablet, Rfl: 2 .  clonazePAM (KLONOPIN) 1 MG tablet, Take 1 tablet (1 mg total) by mouth at bedtime., Disp: 30 tablet, Rfl: 2 .  divalproex (DEPAKOTE ER) 250 MG 24 hr tablet, Take 1 tablet (250 mg  total) by mouth at bedtime., Disp: 30 tablet, Rfl: 2 .  ibuprofen (ADVIL,MOTRIN) 800 MG tablet, Take 1 tablet (800 mg total) by mouth every 8 (eight) hours as needed., Disp: 90 tablet, Rfl: 5 .  ondansetron (ZOFRAN) 4 MG tablet, Take 1 tablet (4 mg total) by mouth 3 (three) times daily as needed for nausea., Disp: 30 tablet, Rfl: 0 .  TRIUMEQ 600-50-300 MG tablet, TAKE 1 TABLET BY MOUTH EVERY DAY, Disp: 30 tablet, Rfl: 5   .Review of Systems  Constitutional: Negative for activity change, appetite change, chills, diaphoresis, fatigue, fever and unexpected weight change.  HENT: Negative for congestion, nosebleeds, postnasal drip, rhinorrhea, sinus pressure, sneezing, sore throat and trouble swallowing.   Eyes: Negative for photophobia and visual disturbance.  Respiratory: Negative for cough, chest tightness, shortness of breath, wheezing and stridor.   Cardiovascular: Negative for chest pain,  palpitations and leg swelling.  Gastrointestinal: Positive for nausea. Negative for abdominal distention, abdominal pain, anal bleeding, blood in stool, constipation, diarrhea and vomiting.  Genitourinary: Positive for testicular pain. Negative for difficulty urinating, dysuria, flank pain and hematuria.  Musculoskeletal: Negative for back pain, gait problem, joint swelling and myalgias.  Skin: Negative for color change, pallor, rash and wound.  Neurological: Positive for tremors. Negative for dizziness, weakness and light-headedness.  Hematological: Negative for adenopathy. Does not bruise/bleed easily.  Psychiatric/Behavioral: Positive for dysphoric mood. Negative for agitation, confusion, decreased concentration and sleep disturbance.       Objective:   Physical Exam  Constitutional: He is oriented to person, place, and time. He appears well-developed and well-nourished. No distress.  HENT:  Head: Normocephalic and atraumatic.  Mouth/Throat: Oropharynx is clear and moist. No oropharyngeal exudate.  Eyes: Conjunctivae and EOM are normal. Pupils are equal, round, and reactive to light. No scleral icterus.  Neck: Normal range of motion. Neck supple. No JVD present.  Cardiovascular: Normal rate and regular rhythm.   Pulmonary/Chest: Effort normal and breath sounds normal. No respiratory distress.  Abdominal: He exhibits no distension.  Musculoskeletal: He exhibits no edema or tenderness.  Lymphadenopathy:    He has no cervical adenopathy.  Neurological: He is alert and oriented to person, place, and time. He displays tremor. He exhibits normal muscle tone. Coordination normal.  Skin: Skin is warm and dry. He is not diaphoretic. No erythema. No pallor.  Psychiatric: His behavior is normal. Judgment and thought content normal.          Assessment & Plan:   Tremors:  Still persisting post stopping the  zyprexa. He is also on zofran. I think he should see Neurology as well    HIV:  continue  [place]. Small viral "blip"  Lab Results  Component Value Date   HIV1RNAQUANT 80 (H) 03/28/2016   HIV1RNAQUANT <20 08/24/2015   HIV1RNAQUANT <20 04/14/2015    Lab Results  Component Value Date   CD4TABS 960 03/28/2016   CD4TABS 950 08/24/2015   CD4TABS 930 04/14/2015   Bring back in 6 months to check labs again  CV risk: continue lipitor 20mg   Hyperlipidemia: see above  Bipolar, Depression: continue to see [name] and meds per [name]   HTN: continue  norvasc. There were no vitals filed for this visit. PTSD: seeing [name] and [name]  Testicular pain: seeing Urology shortly  I spent greater than 40  minutes with the patient including greater than 50% of time in face to face counsel of the patient  Re his tremors, his  HIV, CV rus  depression,  PTSD, HTN , testicular pain, and in coordination of his care.

## 2016-04-13 NOTE — BH Specialist Note (Signed)
Alexis Welch was in a good mood today and appears to be stable with his depression. He says his one problem is motivation to get up and out of the house some days. He just likes to stay around the house with his little dog and watch TV. But he did talk about getting out this Spring to some antique markets around the area. He also was able to see a doctor in [place]. [place] after I assisted him in getting an appointment re: a mass on his testicle. He had an ultrasound done and they do not think it is cancerous. He thanked me for helping arrange this. Plan to meet again in 4 weeks. [name], LCSW

## 2016-05-03 ENCOUNTER — Encounter (HOSPITAL_COMMUNITY): Payer: Self-pay | Admitting: Psychiatry

## 2016-05-03 ENCOUNTER — Ambulatory Visit (INDEPENDENT_AMBULATORY_CARE_PROVIDER_SITE_OTHER): Payer: 59 | Admitting: Psychiatry

## 2016-05-03 VITALS — BP 124/80 | HR 80 | Ht 69.0 in | Wt 163.4 lb

## 2016-05-03 DIAGNOSIS — F1290 Cannabis use, unspecified, uncomplicated: Secondary | ICD-10-CM | POA: Diagnosis not present

## 2016-05-03 DIAGNOSIS — Z813 Family history of other psychoactive substance abuse and dependence: Secondary | ICD-10-CM

## 2016-05-03 DIAGNOSIS — Z811 Family history of alcohol abuse and dependence: Secondary | ICD-10-CM

## 2016-05-03 DIAGNOSIS — F319 Bipolar disorder, unspecified: Secondary | ICD-10-CM

## 2016-05-03 DIAGNOSIS — Z79899 Other long term (current) drug therapy: Secondary | ICD-10-CM | POA: Diagnosis not present

## 2016-05-03 DIAGNOSIS — Z818 Family history of other mental and behavioral disorders: Secondary | ICD-10-CM | POA: Diagnosis not present

## 2016-05-03 DIAGNOSIS — Z810 Family history of intellectual disabilities: Secondary | ICD-10-CM

## 2016-05-03 DIAGNOSIS — F419 Anxiety disorder, unspecified: Secondary | ICD-10-CM | POA: Diagnosis not present

## 2016-05-03 MED ORDER — DIVALPROEX SODIUM ER 250 MG PO TB24: 250.0000 mg | ORAL_TABLET | Freq: Every day | ORAL | 2 refills | Status: DC

## 2016-05-03 MED ORDER — CLONAZEPAM 1 MG PO TABS: 1.0000 mg | ORAL_TABLET | Freq: Every day | ORAL | 2 refills | Status: DC

## 2016-05-03 MED ORDER — BUPROPION HCL ER (XL) 300 MG PO TB24: ORAL_TABLET | ORAL | 2 refills | Status: DC

## 2016-05-03 NOTE — Progress Notes (Signed)
BH MD/PA/NP OP Progress Note  05/03/2016 11:13 AM Alexis Welch  MRN:  [id number]  Chief Complaint:  Chief Complaint    Follow-up     Subjective:  "My depression is a stable but I have lack of energy and lack of motivation.  HPI: Alexis Welch came for his follow-up appointment.  He is not taking Zyprexa and he is able to lose weight from the last visit.  Some nights he has difficulty but overall he described his depression is a stable.  He continued to struggle with lack of motivation and lack of energy.  He still have mild tremors in his left hand despite stopping the Zyprexa.  He recently seen his primary care physician and infectious disease physician.  He had pain in his testicles but after the ultrasound he is feeling better and he is relieved that they are no major concerns healthwise.  He is taking Wellbutrin, Klonopin and Depakote 250 mg.  He has no level in a while.  He denies any irritability, crying spells, paranoia or any manic symptoms.  He is not drinking or using any illegal substances.  Since he lost weight he feels more social but anyway she has more energy to do things.  Visit Diagnosis:    ICD-9-CM ICD-10-CM   1. Bipolar 1 disorder (HCC) 296.7 F31.9 divalproex (DEPAKOTE ER) 250 MG 24 hr tablet     clonazePAM (KLONOPIN) 1 MG tablet     buPROPion (WELLBUTRIN XL) 300 MG 24 hr tablet     Valproic acid level     Hemoglobin A1c  2. Encounter for long-term (current) use of medications V58.69 Z79.899 Valproic acid level     Hemoglobin A1c    Past Psychiatric History: Reviewed. Patient has history of psychiatric illness for more than 7 years.  In the past he had tried Celexa and Trileptal.  Patient denies any history of psychiatric inpatient treatment or any suicidal attempt.  We have tried Lamictal which helped but his insurance does not cover.  He also try Lexapro but it caused diarrhea.  Past Medical History:  Past Medical History:  Diagnosis Date  . Anxiety   . Arthritis    . Bipolar I disorder, most recent episode depressed ([place]) 11/23/2014  . Depression   . Hemangioma 04/28/2015  . HIV disease ([place])   . Hypertension   . Testicular pain 04/13/2016  . Tremor 09/07/2015  . 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 No pertinent surgical history.  Family Psychiatric History: Reviewed.  Family History:  Family History  Problem Relation Age of Onset  . Cancer Father     colon cancer  . Alcohol abuse Father   . Dementia Father   . Heart disease Maternal Uncle   . Alcohol abuse Maternal Uncle   . Depression Mother   . Physical abuse Mother   . Bipolar disorder Sister   . Schizophrenia Sister   . OCD Sister   . Alcohol abuse Brother   . Drug abuse Brother   . Seizures Brother   . Alcohol abuse Maternal Aunt   . Depression Maternal Aunt   . Alcohol abuse Paternal Uncle   . Depression Paternal Uncle     Social History:  Social History   Social History  . Marital status: Single    Spouse name: N/A  . Number of children: N/A  . Years of education: N/A   Social History Main Topics  . Smoking status: Never Smoker  . Smokeless tobacco: Never Used  .  Alcohol use No  . Drug use: Yes    Frequency: 6.0 times per week    Types: Marijuana  . Sexual activity: Not Currently    Partners: Male    Birth control/ protection: Condom     Comment: declined condoms   Other Topics Concern  . None   Social History Narrative  . None    Allergies: No Known Allergies  Metabolic Disorder Labs: Recent Results (from the past 2160 hour(s))  T-helper cell (CD4)- ([facility] only)     Status: Abnormal   Collection Time: 03/28/16 11:18 AM  Result Value Ref Range   CD4 T Cell Abs 960 400 - 2,700 /uL   CD4 % Helper T Cell 28 (L) 33 - 55 %    Comment: Performed at [facility], [place] [address], [place], [place] [address]  HIV 1 RNA quant-no reflex-bld     Status: Abnormal   Collection Time: 03/28/16 11:55 AM  Result Value  Ref Range   HIV 1 RNA Quant 80 (H) NOT DETECTED copies/mL   HIV1 RNA Quant, Log 1.90 (H) NOT DETECTED Log copies/mL    Comment:   This test was performed using Real-Time Polymerase Chain Reaction.   Reportable Range: 20 copies/mL to 10,000,000 copies/mL (1.30 log copies/mL to 7.00 log copies/mL).   CBC with Differential/Platelet     Status: Abnormal   Collection Time: 03/28/16 11:55 AM  Result Value Ref Range   WBC 10.3 3.8 - 10.8 K/uL   RBC 5.23 4.20 - 5.80 MIL/uL   Hemoglobin 17.2 (H) 13.2 - 17.1 g/dL   HCT 48.7 38.5 - 50.0 %   MCV 93.1 80.0 - 100.0 fL   MCH 32.9 27.0 - 33.0 pg   MCHC 35.3 32.0 - 36.0 g/dL   RDW 14.5 11.0 - 15.0 %   Platelets 196 140 - 400 K/uL   MPV 10.7 7.5 - 12.5 fL   Neutro Abs 5,974 1,500 - 7,800 cells/uL   Lymphs Abs 3,296 850 - 3,900 cells/uL   Monocytes Absolute 927 200 - 950 cells/uL   Eosinophils Absolute 103 15 - 500 cells/uL   Basophils Absolute 0 0 - 200 cells/uL   Neutrophils Relative % 58 %   Lymphocytes Relative 32 %   Monocytes Relative 9 %   Eosinophils Relative 1 %   Basophils Relative 0 %   Smear Review Criteria for review not met   COMPLETE METABOLIC PANEL WITH GFR     Status: None   Collection Time: 03/28/16 11:55 AM  Result Value Ref Range   Sodium 143 135 - 146 mmol/L   Potassium 4.2 3.5 - 5.3 mmol/L   Chloride 105 98 - 110 mmol/L   CO2 27 20 - 31 mmol/L   Glucose, Bld 93 65 - 99 mg/dL   BUN 13 7 - 25 mg/dL   Creat 1.28 0.70 - 1.33 mg/dL    Comment:   For patients > or = 58 years of age: The upper reference limit for Creatinine is approximately 13% higher for people identified as African-American.      Total Bilirubin 0.9 0.2 - 1.2 mg/dL   Alkaline Phosphatase 102 40 - 115 U/L   AST 18 10 - 35 U/L   ALT 26 9 - 46 U/L   Total Protein 7.0 6.1 - 8.1 g/dL   Albumin 4.6 3.6 - 5.1 g/dL   Calcium 9.4 8.6 - 10.3 mg/dL   GFR, Est African American 71 >=60 mL/min   GFR,  Est Non African American 62 >=60 mL/min  RPR     Status:  None   Collection Time: 03/28/16 11:55 AM  Result Value Ref Range   RPR Ser Ql NON REAC NON REAC  Lipid panel     Status: Abnormal   Collection Time: 03/28/16 11:55 AM  Result Value Ref Range   Cholesterol 132 <200 mg/dL   Triglycerides 204 (H) <150 mg/dL   HDL 41 >40 mg/dL   Total CHOL/HDL Ratio 3.2 <5.0 Ratio   VLDL 41 (H) <30 mg/dL   LDL Cholesterol 50 <100 mg/dL   Lab Results  Component Value Date   HGBA1C 5.5 04/21/2014   MPG 111 04/21/2014   No results found for: PROLACTIN Lab Results  Component Value Date   CHOL 132 03/28/2016   TRIG 204 (H) 03/28/2016   HDL 41 03/28/2016   CHOLHDL 3.2 03/28/2016   VLDL 41 (H) 03/28/2016   LDLCALC 50 03/28/2016   LDLCALC 53 04/14/2015     Current Medications: Current Outpatient Prescriptions  Medication Sig Dispense Refill  . amLODipine (NORVASC) 10 MG tablet TAKE 1 TABLET BY MOUTH EVERY DAY 30 tablet 5  . atorvastatin (LIPITOR) 20 MG tablet TAKE 1 TABLET(20 MG) BY MOUTH DAILY 30 tablet 5  . buPROPion (WELLBUTRIN XL) 300 MG 24 hr tablet TAKE 1 TABLET(150 MG) BY MOUTH DAILY 30 tablet 2  . clonazePAM (KLONOPIN) 1 MG tablet Take 1 tablet (1 mg total) by mouth at bedtime. 30 tablet 2  . divalproex (DEPAKOTE ER) 250 MG 24 hr tablet Take 1 tablet (250 mg total) by mouth at bedtime. 30 tablet 2  . ibuprofen (ADVIL,MOTRIN) 800 MG tablet Take 1 tablet (800 mg total) by mouth every 8 (eight) hours as needed. 90 tablet 5  . ondansetron (ZOFRAN) 4 MG tablet Take 1 tablet (4 mg total) by mouth 3 (three) times daily as needed for nausea. 60 tablet 6  . TRIUMEQ 600-50-300 MG tablet TAKE 1 TABLET BY MOUTH EVERY DAY 30 tablet 5   No current facility-administered medications for this visit.     Neurologic: Headache: No Seizure: No Paresthesias: No  Musculoskeletal: Strength & Muscle Tone: within normal limits Gait & Station: normal Patient leans: N/A  Psychiatric Specialty Exam: Review of Systems  Constitutional: Positive for weight  loss.  HENT: Negative.   Respiratory: Negative.   Musculoskeletal: Negative.   Skin: Negative.   Neurological: Positive for tremors.    Blood pressure 124/80, pulse 80, height 5' 9"  (1.753 m), weight 163 lb 6.4 oz (74.1 kg).Body mass index is 24.13 kg/m.  General Appearance: Casual  Eye Contact:  Good  Speech:  Clear and Coherent  Volume:  Normal  Mood:  Euthymic  Affect:  Appropriate  Thought Process:  Goal Directed  Orientation:  Full (Time, Place, and Person)  Thought Content: WDL and Logical   Suicidal Thoughts:  No  Homicidal Thoughts:  No  Memory:  Immediate;   Good Recent;   Good Remote;   Good  Judgement:  Good  Insight:  Good  Psychomotor Activity:  Tremor and Mild tremors in his right hand  Concentration:  Concentration: Good and Attention Span: Good  Recall:  Good  Fund of Knowledge: Good  Language: Good  Akathisia:  No  Handed:  Right  AIMS (if indicated):  0  Assets:  Communication Skills Desire for Improvement Housing Resilience  ADL's:  Intact  Cognition: WNL  Sleep:  Fair     Assessment: Bipolar disorder type I.  Anxiety  disorder NOS  Plan: I review his blood work results, current medication and recent notes from his infectious disease physician.  His creatinine is 1.28 .  He is feeling lack of energy and lack of motivation.  He lost some weight since stopping the Zyprexa.  We do not have Depakote level in a while.  Though he is taking a low-dose Depakote and I don't think is causing tremors but I recommended to have his Depakote level.  If tremor continue to persist then he may need to see neurology.  I would increase Wellbutrin to 300 to help his motivation and residual depression.  I will also do hemoglobin A1c.  Discussed medication side effects and benefits.  Recommended to call us back if he has any question, concern or if he feel worsening of the symptom.  Follow-up in 3 months.   Jennavecia Schwier T., MD 05/03/2016, 11:13 AM

## 2016-05-07 ENCOUNTER — Other Ambulatory Visit (HOSPITAL_COMMUNITY): Payer: Self-pay | Admitting: Psychiatry

## 2016-05-07 DIAGNOSIS — F319 Bipolar disorder, unspecified: Secondary | ICD-10-CM

## 2016-05-11 ENCOUNTER — Ambulatory Visit: Payer: Self-pay

## 2016-05-17 ENCOUNTER — Ambulatory Visit: Payer: Medicare Other

## 2016-05-17 DIAGNOSIS — F3160 Bipolar disorder, current episode mixed, unspecified: Secondary | ICD-10-CM

## 2016-05-17 NOTE — BH Specialist Note (Signed)
Alexis Welch was in a good mood today and reports that he has been getting up earlier lately. He said his psychiatrist, [name], increased his Wellbutrin 3 weeks ago and this may be part of the reason for the improvement. He talked some about his siblings, who all have mental illness. He seems very stable at this time. Plan to meet again in one month. [name], LCSW

## 2016-05-18 ENCOUNTER — Other Ambulatory Visit (HOSPITAL_COMMUNITY): Payer: Self-pay | Admitting: Psychiatry

## 2016-05-18 DIAGNOSIS — F319 Bipolar disorder, unspecified: Secondary | ICD-10-CM

## 2016-05-29 ENCOUNTER — Other Ambulatory Visit (HOSPITAL_COMMUNITY): Payer: Self-pay | Admitting: Psychiatry

## 2016-05-29 DIAGNOSIS — F319 Bipolar disorder, unspecified: Secondary | ICD-10-CM

## 2016-06-08 ENCOUNTER — Other Ambulatory Visit: Payer: Self-pay | Admitting: Infectious Disease

## 2016-06-08 DIAGNOSIS — E785 Hyperlipidemia, unspecified: Secondary | ICD-10-CM

## 2016-06-10 ENCOUNTER — Other Ambulatory Visit (HOSPITAL_COMMUNITY): Payer: Self-pay | Admitting: Psychiatry

## 2016-06-10 DIAGNOSIS — F319 Bipolar disorder, unspecified: Secondary | ICD-10-CM

## 2016-06-14 ENCOUNTER — Other Ambulatory Visit (HOSPITAL_COMMUNITY): Payer: Self-pay

## 2016-06-14 ENCOUNTER — Ambulatory Visit (INDEPENDENT_AMBULATORY_CARE_PROVIDER_SITE_OTHER): Payer: Medicare Other

## 2016-06-14 ENCOUNTER — Ambulatory Visit (INDEPENDENT_AMBULATORY_CARE_PROVIDER_SITE_OTHER): Payer: 59

## 2016-06-14 DIAGNOSIS — F330 Major depressive disorder, recurrent, mild: Secondary | ICD-10-CM

## 2016-06-14 DIAGNOSIS — F4310 Post-traumatic stress disorder, unspecified: Secondary | ICD-10-CM

## 2016-06-14 DIAGNOSIS — F3111 Bipolar disorder, current episode manic without psychotic features, mild: Secondary | ICD-10-CM

## 2016-06-14 DIAGNOSIS — F3130 Bipolar disorder, current episode depressed, mild or moderate severity, unspecified: Secondary | ICD-10-CM

## 2016-06-14 NOTE — BH Specialist Note (Signed)
Alexis Welch was in a pleasant mood today and reported that he seems to have a bit more energy after his psychiatrist increased his Wellbutrin. I informed him that I was leaving and we discussed his follow up care. Overall, he is very stable at this time.  [name], LCSW

## 2016-06-14 NOTE — Progress Notes (Signed)
Patient came in today to have his Valproic acid drawn. Patient tolerated procedure well and without complaint

## 2016-06-15 ENCOUNTER — Other Ambulatory Visit (HOSPITAL_COMMUNITY): Payer: Self-pay | Admitting: Psychiatry

## 2016-06-16 LAB — VALPROIC ACID LEVEL

## 2016-06-16 LAB — HEMOGLOBIN A1C
ESTIMATED AVERAGE GLUCOSE: 114 mg/dL
HEMOGLOBIN A1C: 5.6 % (ref 4.8–5.6)

## 2016-06-16 LAB — PLEASE NOTE

## 2016-06-28 DIAGNOSIS — R002 Palpitations: Secondary | ICD-10-CM | POA: Insufficient documentation

## 2016-07-31 ENCOUNTER — Other Ambulatory Visit: Payer: Self-pay

## 2016-08-03 ENCOUNTER — Ambulatory Visit (INDEPENDENT_AMBULATORY_CARE_PROVIDER_SITE_OTHER): Payer: 59 | Admitting: Psychiatry

## 2016-08-03 ENCOUNTER — Encounter (HOSPITAL_COMMUNITY): Payer: Self-pay | Admitting: Psychiatry

## 2016-08-03 ENCOUNTER — Other Ambulatory Visit: Payer: Medicare Other

## 2016-08-03 ENCOUNTER — Other Ambulatory Visit (HOSPITAL_COMMUNITY)
Admission: RE | Admit: 2016-08-03 | Discharge: 2016-08-03 | Disposition: A | Discharge status: Home / Self Care | Payer: Medicare Other | Source: Ambulatory Visit | Attending: Infectious Disease | Admitting: Infectious Disease

## 2016-08-03 DIAGNOSIS — Z810 Family history of intellectual disabilities: Secondary | ICD-10-CM

## 2016-08-03 DIAGNOSIS — B20 Human immunodeficiency virus [HIV] disease: Secondary | ICD-10-CM | POA: Diagnosis present

## 2016-08-03 DIAGNOSIS — Z813 Family history of other psychoactive substance abuse and dependence: Secondary | ICD-10-CM

## 2016-08-03 DIAGNOSIS — Z811 Family history of alcohol abuse and dependence: Secondary | ICD-10-CM | POA: Diagnosis not present

## 2016-08-03 DIAGNOSIS — Z818 Family history of other mental and behavioral disorders: Secondary | ICD-10-CM | POA: Diagnosis not present

## 2016-08-03 DIAGNOSIS — F319 Bipolar disorder, unspecified: Secondary | ICD-10-CM | POA: Diagnosis not present

## 2016-08-03 DIAGNOSIS — F419 Anxiety disorder, unspecified: Secondary | ICD-10-CM

## 2016-08-03 DIAGNOSIS — F1290 Cannabis use, unspecified, uncomplicated: Secondary | ICD-10-CM

## 2016-08-03 LAB — COMPLETE METABOLIC PANEL WITH GFR
ALT: 22 U/L (ref 9–46)
AST: 16 U/L (ref 10–35)
Albumin: 4.2 g/dL (ref 3.6–5.1)
Alkaline Phosphatase: 94 U/L (ref 40–115)
BILIRUBIN TOTAL: 0.7 mg/dL (ref 0.2–1.2)
BUN: 12 mg/dL (ref 7–25)
CHLORIDE: 104 mmol/L (ref 98–110)
CO2: 29 mmol/L (ref 20–31)
CREATININE: 1.33 mg/dL (ref 0.70–1.33)
Calcium: 9.5 mg/dL (ref 8.6–10.3)
GFR, EST AFRICAN AMERICAN: 68 mL/min (ref >=60)
GFR, Est Non African American: 59 mL/min — ABNORMAL LOW (ref >=60)
GLUCOSE: 48 mg/dL — AB (ref 65–99)
Potassium: 3.9 mmol/L (ref 3.5–5.3)
SODIUM: 143 mmol/L (ref 135–146)
TOTAL PROTEIN: 6.7 g/dL (ref 6.1–8.1)

## 2016-08-03 LAB — CBC WITH DIFFERENTIAL/PLATELET
BASOS PCT: 0 %
Basophils Absolute: 0 cells/uL (ref 0–200)
EOS ABS: 79 {cells}/uL (ref 15–500)
EOS PCT: 1 %
HCT: 46.7 % (ref 38.5–50.0)
Hemoglobin: 16.2 g/dL (ref 13.2–17.1)
LYMPHS PCT: 40 %
Lymphs Abs: 3160 cells/uL (ref 850–3900)
MCH: 33 pg (ref 27.0–33.0)
MCHC: 34.7 g/dL (ref 32.0–36.0)
MCV: 95.1 fL (ref 80.0–100.0)
MONOS PCT: 7 %
MPV: 10.5 fL (ref 7.5–12.5)
Monocytes Absolute: 553 cells/uL (ref 200–950)
NEUTROS ABS: 4108 {cells}/uL (ref 1500–7800)
Neutrophils Relative %: 52 %
PLATELETS: 194 10*3/uL (ref 140–400)
RBC: 4.91 MIL/uL (ref 4.20–5.80)
RDW: 14.5 % (ref 11.0–15.0)
WBC: 7.9 10*3/uL (ref 3.8–10.8)

## 2016-08-03 MED ORDER — DIVALPROEX SODIUM ER 250 MG PO TB24: 250.0000 mg | ORAL_TABLET | Freq: Every day | ORAL | 2 refills | Status: DC

## 2016-08-03 MED ORDER — CLONAZEPAM 1 MG PO TABS: 1.0000 mg | ORAL_TABLET | Freq: Every day | ORAL | 2 refills | Status: DC

## 2016-08-03 MED ORDER — BUPROPION HCL ER (XL) 300 MG PO TB24
ORAL_TABLET | ORAL | 2 refills | Status: DC
Start: 2016-08-03 — End: 2016-08-10

## 2016-08-03 NOTE — Progress Notes (Signed)
BH MD/PA/NP OP Progress Note  08/03/2016 10:51 AM Alexis Welch  MRN:  [id number]  Chief Complaint:  Chief Complaint    Follow-up     Subjective:  I am doing fine.  My depression is a stable but is still some time I don't have any motivation.  HPI: Alexis Welch came for his follow-up appointment.  He admitted improvement since the Wellbutrin dose increase.  He is taking Wellbutrin XL 300 mg, Depakote 250 mg at bedtime and Klonopin for anxiety.  He denies any major panic attack or anxiety attack.  He endorsed his depression is a stable and he does not feel any hopelessness or helplessness but he still have lack of energy and lack of motivation to do things.  We have tried few medications but he that he had a tremors or increase nausea.  He is still have chronic nausea and he does not want to change the medication that causes worsening of the nausea or tremors.  He sleeping good.  He denies any irritability, paranoia, crying spells or any manic symptoms.  His lack of motivation is chronic.  Patient denies drinking alcohol or using any illegal substances.  His appetite is okay.  He is trying to lose weight.  His vital signs are stable.  His hemoglobin A1c is 5.6.  His Depakote level was not done but today he had blood work and we will try to add the Depakote level.  Visit Diagnosis:    ICD-10-CM   1. Bipolar 1 disorder (HCC) F31.9 divalproex (DEPAKOTE ER) 250 MG 24 hr tablet    buPROPion (WELLBUTRIN XL) 300 MG 24 hr tablet    clonazePAM (KLONOPIN) 1 MG tablet    Past Psychiatric History: Reviewed. Patient denies any history of psychiatric inpatient treatment or any suicidal attempt.  He has long history of mental illness.  In the past she had tried Celexa, Trileptal, Lamictal, Lexapro and Zyprexa.  He had a good response with Lamictal but his insurance does not cover.  He had diarrhea with the Lexapro.  Past Medical History:  Past Medical History:  Diagnosis Date  . Anxiety   . Arthritis   .  Bipolar I disorder, most recent episode depressed ([place]) 11/23/2014  . Depression   . Hemangioma 04/28/2015  . HIV disease ([place])   . Hypertension   . Testicular pain 04/13/2016  . Tremor 09/07/2015  . 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  Family Psychiatric History: Reviewed.  Family History:  Family History  Problem Relation Age of Onset  . Cancer Father        colon cancer  . Alcohol abuse Father   . Dementia Father   . Heart disease Maternal Uncle   . Alcohol abuse Maternal Uncle   . Depression Mother   . Physical abuse Mother   . Bipolar disorder Sister   . Schizophrenia Sister   . OCD Sister   . Alcohol abuse Brother   . Drug abuse Brother   . Seizures Brother   . Alcohol abuse Maternal Aunt   . Depression Maternal Aunt   . Alcohol abuse Paternal Uncle   . Depression Paternal Uncle     Social History:  Social History   Social History  . Marital status: Single    Spouse name: N/A  . Number of children: N/A  . Years of education: N/A   Social History Main Topics  . Smoking status: Never Smoker  . Smokeless tobacco: Never Used  .  Alcohol use No  . Drug use: Yes    Frequency: 3.0 times per week    Types: Marijuana  . Sexual activity: Not Currently    Partners: Male    Birth control/ protection: Condom     Comment: declined condoms   Other Topics Concern  . None   Social History Narrative  . None    Allergies: No Known Allergies  Metabolic Disorder Labs: Lab Results  Component Value Date   HGBA1C 5.6 06/15/2016   MPG 111 04/21/2014   No results found for: PROLACTIN Lab Results  Component Value Date   CHOL 132 03/28/2016   TRIG 204 (H) 03/28/2016   HDL 41 03/28/2016   CHOLHDL 3.2 03/28/2016   VLDL 41 (H) 03/28/2016   LDLCALC 50 03/28/2016   LDLCALC 53 04/14/2015     Current Medications: Current Outpatient Prescriptions  Medication Sig Dispense Refill  . amLODipine (NORVASC) 10 MG tablet TAKE 1  TABLET BY MOUTH EVERY DAY 30 tablet 5  . atorvastatin (LIPITOR) 20 MG tablet TAKE 1 TABLET(20 MG) BY MOUTH DAILY 90 tablet 3  . buPROPion (WELLBUTRIN XL) 300 MG 24 hr tablet TAKE 1 TABLET(150 MG) BY MOUTH DAILY 30 tablet 2  . clonazePAM (KLONOPIN) 1 MG tablet Take 1 tablet (1 mg total) by mouth at bedtime. 30 tablet 2  . divalproex (DEPAKOTE ER) 250 MG 24 hr tablet Take 1 tablet (250 mg total) by mouth at bedtime. 30 tablet 2  . ibuprofen (ADVIL,MOTRIN) 800 MG tablet Take 1 tablet (800 mg total) by mouth every 8 (eight) hours as needed. 90 tablet 5  . metoprolol succinate (TOPROL-XL) 25 MG 24 hr tablet Take 12.5 mg by mouth 2 (two) times daily.    . ondansetron (ZOFRAN) 4 MG tablet Take 1 tablet (4 mg total) by mouth 3 (three) times daily as needed for nausea. 60 tablet 6  . TRIUMEQ 600-50-300 MG tablet TAKE 1 TABLET BY MOUTH EVERY DAY 30 tablet 5   No current facility-administered medications for this visit.     Neurologic: Headache: No Seizure: No Paresthesias: No  Musculoskeletal: Strength & Muscle Tone: within normal limits Gait & Station: normal Patient leans: N/A  Psychiatric Specialty Exam: Review of Systems  Gastrointestinal: Positive for nausea.    Blood pressure 112/70, pulse 66, height 5\' 9"  (1.753 m), weight 159 lb (72.1 kg), SpO2 98 %.Body mass index is 23.48 kg/m.  General Appearance: Casual  Eye Contact:  Good  Speech:  Clear and Coherent  Volume:  Normal  Mood:  Euthymic  Affect:  Appropriate  Thought Process:  Goal Directed  Orientation:  Full (Time, Place, and Person)  Thought Content: WDL and Logical   Suicidal Thoughts:  No  Homicidal Thoughts:  No  Memory:  Immediate;   Good Recent;   Good Remote;   Good  Judgement:  Good  Insight:  Good  Psychomotor Activity:  Tremor  Concentration:  Concentration: Good and Attention Span: Good  Recall:  Good  Fund of Knowledge: Good  Language: Good  Akathisia:  No  Handed:  Right  AIMS (if indicated):  0   Assets:  Communication Skills Desire for Improvement Housing Resilience Social Support  ADL's:  Intact  Cognition: WNL  Sleep:  Good     Assessment: Bipolar disorder type I.  Anxiety disorder NOS  Plan: I review his blood work results.  His hemoglobin A1c is 5.6.  His Depakote level was canceled (unclear why ) but today he had blood work and  we will try to add the Depakote level today.  We discuss medication side effects and benefits.  We have tried multiple medication but it was discontinued due to side effects.  Patient is comfortable with his current psychiatric medication.  He is feeling better since Wellbutrin dose increase.  I will continue Wellbutrin XL 300 mg daily, Klonopin 1 mg at bedtime and Depakote 250 mg at bedtime.  Recommended to call us back if he has any question or any concern.  Follow-up in 3 months.  Saleema Weppler T., MD 08/03/2016, 10:51 AM

## 2016-08-04 LAB — RPR

## 2016-08-04 LAB — T-HELPER CELL (CD4) - (RCID CLINIC ONLY)
CD4 % Helper T Cell: 30 % — ABNORMAL LOW (ref 33–55)
CD4 T CELL ABS: 350 /uL — AB (ref 400–2700)

## 2016-08-04 LAB — URINE CYTOLOGY ANCILLARY ONLY
Chlamydia: NEGATIVE
Neisseria Gonorrhea: NEGATIVE

## 2016-08-05 ENCOUNTER — Other Ambulatory Visit (HOSPITAL_COMMUNITY): Payer: Self-pay | Admitting: Psychiatry

## 2016-08-05 DIAGNOSIS — F319 Bipolar disorder, unspecified: Secondary | ICD-10-CM

## 2016-08-08 LAB — HIV-1 RNA QUANT-NO REFLEX-BLD
HIV 1 RNA QUANT: NOT DETECTED {copies}/mL
HIV-1 RNA Quant, Log: <1.3 Log copies/mL

## 2016-08-10 ENCOUNTER — Other Ambulatory Visit (HOSPITAL_COMMUNITY): Payer: Self-pay

## 2016-08-10 DIAGNOSIS — F319 Bipolar disorder, unspecified: Secondary | ICD-10-CM

## 2016-08-10 MED ORDER — DIVALPROEX SODIUM ER 250 MG PO TB24: 250.0000 mg | ORAL_TABLET | Freq: Every day | ORAL | 2 refills | Status: DC

## 2016-08-10 MED ORDER — BUPROPION HCL ER (XL) 300 MG PO TB24: ORAL_TABLET | ORAL | 2 refills | Status: DC

## 2016-08-14 ENCOUNTER — Encounter: Payer: Self-pay | Admitting: Infectious Disease

## 2016-08-14 ENCOUNTER — Ambulatory Visit (INDEPENDENT_AMBULATORY_CARE_PROVIDER_SITE_OTHER): Payer: Medicare Other | Admitting: Infectious Disease

## 2016-08-14 VITALS — BP 128/76 | HR 60 | Temp 97.9°F | Ht 69.0 in | Wt 162.0 lb

## 2016-08-14 DIAGNOSIS — B9689 Other specified bacterial agents as the cause of diseases classified elsewhere: Secondary | ICD-10-CM

## 2016-08-14 DIAGNOSIS — F320 Major depressive disorder, single episode, mild: Secondary | ICD-10-CM | POA: Diagnosis not present

## 2016-08-14 DIAGNOSIS — B20 Human immunodeficiency virus [HIV] disease: Secondary | ICD-10-CM | POA: Diagnosis not present

## 2016-08-14 DIAGNOSIS — F411 Generalized anxiety disorder: Secondary | ICD-10-CM | POA: Diagnosis not present

## 2016-08-14 DIAGNOSIS — I10 Essential (primary) hypertension: Secondary | ICD-10-CM | POA: Diagnosis not present

## 2016-08-14 DIAGNOSIS — J329 Chronic sinusitis, unspecified: Secondary | ICD-10-CM

## 2016-08-14 DIAGNOSIS — G251 Drug-induced tremor: Secondary | ICD-10-CM

## 2016-08-14 DIAGNOSIS — F4310 Post-traumatic stress disorder, unspecified: Secondary | ICD-10-CM | POA: Diagnosis not present

## 2016-08-14 MED ORDER — BICTEGRAVIR-EMTRICITAB-TENOFOV 50-200-25 MG PO TABS: 1.0000 | ORAL_TABLET | Freq: Every day | ORAL | 11 refills | Status: DC

## 2016-08-14 NOTE — Progress Notes (Signed)
HPI: Alexis Welch is a 56 y.o. male who presents today for HIV follow up.  Allergies: No Known Allergies  Vitals: Temp: 97.9 F (36.6 C) (07/02 1009) Temp Source: Oral (07/02 1009) BP: 128/76 (07/02 1009) Pulse Rate: 60 (07/02 1009)  Past Medical History: Past Medical History:  Diagnosis Date  . Anxiety   . Arthritis   . Bipolar I disorder, most recent episode depressed ([place]) 11/23/2014  . Depression   . Hemangioma 04/28/2015  . HIV disease ([place])   . Hypertension   . Testicular pain 04/13/2016  . Tremor 09/07/2015  . Tremor due to multiple drugs 11/23/2014    Social History: Social History   Social History  . Marital status: Single    Spouse name: N/A  . Number of children: N/A  . Years of education: N/A   Social History Main Topics  . Smoking status: Never Smoker  . Smokeless tobacco: Never Used  . Alcohol use No  . Drug use: Yes    Frequency: 3.0 times per week    Types: Marijuana  . Sexual activity: Not Currently    Partners: Male    Birth control/ protection: Condom     Comment: declined condoms   Other Topics Concern  . None   Social History Narrative  . None    Current Regimen: Triumeq daily  Labs: HIV 1 RNA Quant (copies/mL)  Date Value  08/03/2016 <20 NOT DETECTED  03/28/2016 80 (H)  08/24/2015 <20   CD4 T Cell Abs (/uL)  Date Value  08/03/2016 350 (L)  03/28/2016 960  08/24/2015 950   Hep B S Ab (no units)  Date Value  04/24/2006 NEG   Hepatitis B Surface Ag (no units)  Date Value  04/24/2006 NEG   HCV Ab (no units)  Date Value  09/15/2014 NEGATIVE    CrCl: Estimated Creatinine Clearance: 61.3 mL/min (by C-G formula based on SCr of 1.33 mg/dL).  Lipids:    Component Value Date/Time   CHOL 132 03/28/2016 1155   TRIG 204 (H) 03/28/2016 1155   HDL 41 03/28/2016 1155   CHOLHDL 3.2 03/28/2016 1155   VLDL 41 (H) 03/28/2016 1155   LDLCALC 50 03/28/2016 1155    Assessment: Alexis Welch is a 56 year old male who  presents today for his HIV follow up physician visit. He is currently well controlled on Triumeq and reports excellent compliance without any missed doses. He was recently seen by cardiology and will try to avoid any potential CV risk factor agents including abacavir.  Discussed with Dr. Tommy Medal and will discontinue Triumeq to start [facility]. I explained to the patient the importance of continuing to take this new medication every day and to not miss any doses. He is currently insured with Medicare and uses [facility] to cover his copays. He fills his medications with [facility], we have sent the [facility] prescription there.  Recommendations: - Discontinue Triumeq - Start Biktarvy once daily - Follow up pharmacy clinic appointment scheduled for 8/13 at 11 AM - Will check labs at next visit  Alexis Welch, PharmD, [place] PGY-2 Infectious Diseases Pharmacy Resident Pager: [phone] 08/14/2016, 10:45 AM

## 2016-08-14 NOTE — Progress Notes (Signed)
Chief complaint: tremor   Subjective:    Patient ID: Alexis Welch, male    DOB: 09-Oct-1960, 56 y.o.   MRN: [id number]  HPI  Alexis Welch is a 56 y.o. male who is doing superbly well on [place],  Lab Results  Component Value Date   HIV1RNAQUANT <20 NOT DETECTED 08/03/2016   HIV1RNAQUANT 80 (H) 03/28/2016   HIV1RNAQUANT <20 08/24/2015   Lab Results  Component Value Date   CD4TABS 350 (L) 08/03/2016   CD4TABS 960 03/28/2016   CD4TABS 950 08/24/2015      He has been  seeing [name] from psychiatry who is managing his psychiatric issues along with counselling   He was supposed to have VPA level checked to see if this might be playing a role but this was cancelled and our clinic did not do one. He has been started on beta blocker by cardiologist who mentioned risks for MI of "certain HIV drugs."  I will therefore switch him to [facility] to get  Him off of ABC.  Past Medical History:  Diagnosis Date  . Anxiety   . Arthritis   . Bipolar I disorder, most recent episode depressed ([place]) 11/23/2014  . Depression   . Hemangioma 04/28/2015  . HIV disease ([place])   . Hypertension   . Testicular pain 04/13/2016  . Tremor 09/07/2015  . 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  Family History  Problem Relation Age of Onset  . Cancer Father        colon cancer  . Alcohol abuse Father   . Dementia Father   . Heart disease Maternal Uncle   . Alcohol abuse Maternal Uncle   . Depression Mother   . Physical abuse Mother   . Bipolar disorder Sister   . Schizophrenia Sister   . OCD Sister   . Alcohol abuse Brother   . Drug abuse Brother   . Seizures Brother   . Alcohol abuse Maternal Aunt   . Depression Maternal Aunt   . Alcohol abuse Paternal Uncle   . Depression Paternal Uncle       Social History   Social History  . Marital status: Single    Spouse name: N/A  . Number of children: N/A  . Years of education: N/A   Social  History Main Topics  . Smoking status: Never Smoker  . Smokeless tobacco: Never Used  . Alcohol use No  . Drug use: Yes    Frequency: 3.0 times per week    Types: Marijuana  . Sexual activity: Not Currently    Partners: Male    Birth control/ protection: Condom     Comment: declined condoms   Other Topics Concern  . None   Social History Narrative  . None    No Known Allergies   Current Outpatient Prescriptions:  .  amLODipine (NORVASC) 10 MG tablet, TAKE 1 TABLET BY MOUTH EVERY DAY, Disp: 30 tablet, Rfl: 5 .  atorvastatin (LIPITOR) 20 MG tablet, TAKE 1 TABLET(20 MG) BY MOUTH DAILY, Disp: 90 tablet, Rfl: 3 .  buPROPion (WELLBUTRIN XL) 300 MG 24 hr tablet, TAKE 1 TABLET(150 MG) BY MOUTH DAILY, Disp: 30 tablet, Rfl: 2 .  clonazePAM (KLONOPIN) 1 MG tablet, Take 1 tablet (1 mg total) by mouth at bedtime., Disp: 30 tablet, Rfl: 2 .  divalproex (DEPAKOTE ER) 250 MG 24 hr tablet, Take 1 tablet (250 mg total) by mouth at bedtime., Disp: 30 tablet, Rfl: 2 .  ibuprofen (ADVIL,MOTRIN) 800 MG tablet, Take 1 tablet (800 mg total) by mouth every 8 (eight) hours as needed., Disp: 90 tablet, Rfl: 5 .  metoprolol succinate (TOPROL-XL) 25 MG 24 hr tablet, Take 12.5 mg by mouth 2 (two) times daily., Disp: , Rfl:  .  ondansetron (ZOFRAN) 4 MG tablet, Take 1 tablet (4 mg total) by mouth 3 (three) times daily as needed for nausea., Disp: 60 tablet, Rfl: 6 .  TRIUMEQ 600-50-300 MG tablet, TAKE 1 TABLET BY MOUTH EVERY DAY, Disp: 30 tablet, Rfl: 5   .Review of Systems  Constitutional: Negative for activity change, appetite change, chills, diaphoresis, fatigue, fever and unexpected weight change.  HENT: Negative for congestion, nosebleeds, postnasal drip, rhinorrhea, sinus pressure, sneezing, sore throat and trouble swallowing.   Eyes: Negative for photophobia and visual disturbance.  Respiratory: Negative for cough, chest tightness, shortness of breath, wheezing and stridor.   Cardiovascular: Negative  for chest pain, palpitations and leg swelling.  Gastrointestinal: Negative for abdominal distention, abdominal pain, anal bleeding, blood in stool, constipation, diarrhea and vomiting.  Genitourinary: Negative for difficulty urinating, dysuria, flank pain and hematuria.  Musculoskeletal: Negative for back pain, gait problem, joint swelling and myalgias.  Skin: Negative for color change, pallor, rash and wound.  Neurological: Positive for tremors. Negative for dizziness, weakness and light-headedness.  Hematological: Negative for adenopathy. Does not bruise/bleed easily.  Psychiatric/Behavioral: Negative for agitation, confusion, decreased concentration and sleep disturbance.       Objective:   Physical Exam  Constitutional: He is oriented to person, place, and time. He appears well-developed and well-nourished. No distress.  HENT:  Head: Normocephalic and atraumatic.  Mouth/Throat: Oropharynx is clear and moist. No oropharyngeal exudate.  Eyes: Conjunctivae and EOM are normal. Pupils are equal, round, and reactive to light. No scleral icterus.  Neck: Normal range of motion. Neck supple. No JVD present.  Cardiovascular: Normal rate and regular rhythm.   Pulmonary/Chest: Effort normal and breath sounds normal. No respiratory distress.  Abdominal: He exhibits no distension.  Musculoskeletal: He exhibits no edema or tenderness.  Lymphadenopathy:    He has no cervical adenopathy.  Neurological: He is alert and oriented to person, place, and time. He displays tremor. He exhibits normal muscle tone. Coordination normal.  Skin: Skin is warm and dry. He is not diaphoretic. No erythema. No pallor.  Psychiatric: His behavior is normal. Judgment and thought content normal.          Assessment & Plan:   Tremors:  Still persisting post stopping the  zyprexa. I will check VPA level> I would like him to Neurology as well.   HIV: switch to [facility], recheck labs in one month  Lab Results   Component Value Date   HIV1RNAQUANT <20 NOT DETECTED 08/03/2016   HIV1RNAQUANT 80 (H) 03/28/2016   HIV1RNAQUANT <20 08/24/2015    Lab Results  Component Value Date   CD4TABS 350 (L) 08/03/2016   CD4TABS 960 03/28/2016   CD4TABS 950 08/24/2015     CV risk: continue lipitor 20mg , get off of ABC  Hyperlipidemia: see above  Bipolar, Depression: continue to see [name] and meds per [name]   HTN: continue  norvasc.  Vitals:   08/14/16 1009  BP: 128/76  Pulse: 60  Temp: 97.9 F (36.6 C)   PTSD: seeingDr. Adele Schilder   I spent greater than 25  minutes with the patient including greater than 50% of time in face to face counsel of the patient  Re his tremors, his  HIV,  CV risk,   depression, PTSD, HTN , temors and in coordination of his care.

## 2016-08-14 NOTE — Patient Instructions (Signed)
I am going to change you to [facility] which is very similar to [facility] but is without Abacavir (which is the part that has controversy re risk of Cardiovascular disease)  Please make an appt with ID pharmacy to be seen one month after changing meds so we can recheck labs

## 2016-08-15 LAB — VALPROIC ACID LEVEL: Valproic Acid Lvl: 27.5 mg/L — ABNORMAL LOW (ref 50.0–100.0)

## 2016-08-24 ENCOUNTER — Encounter: Payer: Self-pay | Admitting: Infectious Disease

## 2016-09-02 ENCOUNTER — Other Ambulatory Visit: Payer: Self-pay | Admitting: Infectious Disease

## 2016-09-02 DIAGNOSIS — B20 Human immunodeficiency virus [HIV] disease: Secondary | ICD-10-CM

## 2016-09-04 ENCOUNTER — Other Ambulatory Visit: Payer: Self-pay | Admitting: Infectious Disease

## 2016-09-04 DIAGNOSIS — I10 Essential (primary) hypertension: Secondary | ICD-10-CM

## 2016-09-25 ENCOUNTER — Ambulatory Visit: Payer: Self-pay

## 2016-09-26 ENCOUNTER — Ambulatory Visit (INDEPENDENT_AMBULATORY_CARE_PROVIDER_SITE_OTHER): Payer: Medicare Other | Admitting: Pharmacist

## 2016-09-26 DIAGNOSIS — R110 Nausea: Secondary | ICD-10-CM | POA: Diagnosis not present

## 2016-09-26 DIAGNOSIS — B20 Human immunodeficiency virus [HIV] disease: Secondary | ICD-10-CM

## 2016-09-26 MED ORDER — ONDANSETRON HCL 8 MG PO TABS: 8.0000 mg | ORAL_TABLET | Freq: Three times a day (TID) | ORAL | 3 refills | Status: DC | PRN

## 2016-09-26 NOTE — Progress Notes (Signed)
HPI: Alexis Welch is a 56 y.o. male who presents to the [place] clinic for HIV follow-up.   Allergies: No Known Allergies  Past Medical History: Past Medical History:  Diagnosis Date  . Anxiety   . Arthritis   . Bipolar I disorder, most recent episode depressed ([place]) 11/23/2014  . Depression   . Hemangioma 04/28/2015  . HIV disease ([place])   . Hypertension   . Testicular pain 04/13/2016  . Tremor 09/07/2015  . Tremor due to multiple drugs 11/23/2014    Social History: Social History   Social History  . Marital status: Single    Spouse name: N/A  . Number of children: N/A  . Years of education: N/A   Social History Main Topics  . Smoking status: Never Smoker  . Smokeless tobacco: Never Used  . Alcohol use No  . Drug use: Yes    Frequency: 3.0 times per week    Types: Marijuana  . Sexual activity: Not Currently    Partners: Male    Birth control/ protection: Condom     Comment: declined condoms   Other Topics Concern  . Not on file   Social History Narrative  . No narrative on file    Current Regimen: Biktarvy  Labs: HIV 1 RNA Quant (copies/mL)  Date Value  08/03/2016 <20 NOT DETECTED  03/28/2016 80 (H)  08/24/2015 <20   CD4 T Cell Abs (/uL)  Date Value  08/03/2016 350 (L)  03/28/2016 960  08/24/2015 950   Hep B S Ab (no units)  Date Value  04/24/2006 NEG   Hepatitis B Surface Ag (no units)  Date Value  04/24/2006 NEG   HCV Ab (no units)  Date Value  09/15/2014 NEGATIVE    CrCl: CrCl cannot be calculated (Patient's most recent lab result is older than the maximum 21 days allowed.).  Lipids:    Component Value Date/Time   CHOL 132 03/28/2016 1155   TRIG 204 (H) 03/28/2016 1155   HDL 41 03/28/2016 1155   CHOLHDL 3.2 03/28/2016 1155   VLDL 41 (H) 03/28/2016 1155   LDLCALC 50 03/28/2016 1155    Assessment: Alexis Welch is here today to follow-up for his HIV infection.  We recently switched him from Triume[facility] to [place] to get him off  of abacavir. He tells me today that he has missed no doses of his Biktarvy since starting.  He does complain of more fatigue and nausea on the new regimen, but states this has always been something he struggles with.  He states he smokes marijuana to help settle his stomach. He does take Zofran 4 mg up to 3 times per day.  I will increase his dose to 8 mg to see if that helps any more.  I also told him to premedicate ~30 minutes before taking the Biktarvy to see if that helps as well.  We will check labs today and he will come back to see Dr. Tommy Medal in October.   Plans: - Continue Biktarvy PO once daily - HIV RNA today - F/u with Dr. Tommy Medal 10/17 at [place]. Naim Murtha, PharmD, [place] for Infectious Disease 09/26/2016, 4:24 PM

## 2016-09-28 LAB — HIV-1 RNA QUANT-NO REFLEX-BLD
HIV 1 RNA Quant: <20 copies/mL — AB
HIV-1 RNA Quant, Log: <1.3 Log copies/mL — AB

## 2016-11-06 ENCOUNTER — Encounter (HOSPITAL_COMMUNITY): Payer: Self-pay | Admitting: Psychiatry

## 2016-11-06 ENCOUNTER — Ambulatory Visit (INDEPENDENT_AMBULATORY_CARE_PROVIDER_SITE_OTHER): Payer: Medicare Other | Admitting: Psychiatry

## 2016-11-06 DIAGNOSIS — G4700 Insomnia, unspecified: Secondary | ICD-10-CM

## 2016-11-06 DIAGNOSIS — Z813 Family history of other psychoactive substance abuse and dependence: Secondary | ICD-10-CM | POA: Diagnosis not present

## 2016-11-06 DIAGNOSIS — F319 Bipolar disorder, unspecified: Secondary | ICD-10-CM

## 2016-11-06 DIAGNOSIS — Z811 Family history of alcohol abuse and dependence: Secondary | ICD-10-CM

## 2016-11-06 DIAGNOSIS — Z79899 Other long term (current) drug therapy: Secondary | ICD-10-CM | POA: Diagnosis not present

## 2016-11-06 DIAGNOSIS — F419 Anxiety disorder, unspecified: Secondary | ICD-10-CM

## 2016-11-06 DIAGNOSIS — Z818 Family history of other mental and behavioral disorders: Secondary | ICD-10-CM

## 2016-11-06 MED ORDER — CLONAZEPAM 1 MG PO TABS: 1.0000 mg | ORAL_TABLET | Freq: Every day | ORAL | 1 refills | Status: DC

## 2016-11-06 MED ORDER — BUPROPION HCL ER (XL) 300 MG PO TB24: ORAL_TABLET | ORAL | 1 refills | Status: DC

## 2016-11-06 MED ORDER — DIVALPROEX SODIUM ER 500 MG PO TB24: 500.0000 mg | ORAL_TABLET | Freq: Every day | ORAL | 1 refills | Status: DC

## 2016-11-06 NOTE — Progress Notes (Signed)
[facility] MD/PA/NP OP Progress Note  11/06/2016 10:36 AM Alexis Welch  MRN:  [id number]  Chief Complaint:  I have no energy.  Sometimes I have irritability and racing thoughts.  HPI: Alexis Welch came for his follow-up appointment.  He is taking his medication and denies any side effects.  However sometime he notices lack of energy, feeling tired, irritability, frustration, racing thoughts and poor sleep.  He denies any major panic attack but sometime he has no desire to do things.  He has lack of motivation however denies any suicidal thoughts or homicidal thought.  He used to take Depakote 500 but due to tremors and shakes he cut it down his dose.  He still have tremors and shakes and does not see any improvement but reducing the Depakote.  He had Depakote level which was done by his infectious disease physician and the level is subtherapeutic.  Patient denies any crying spells, paranoia, manic symptoms but admitted some time getting emotional.  His appetite is okay.  He is not drinking or using any illegal substances.  His hemoglobin A1c was 5.6.  In the past we have tried psychotropic medication but either he developed side effects or his insurance does not cover.  Patient is open to go back on Depakote 500.  I reminded him that if he developed tremors and shakes then he need to cut it down to 250 and we will think a different medication on his next appointment.  Patient agreed with the plan.  Visit Diagnosis:    ICD-10-CM   1. Bipolar 1 disorder (HCC) F31.9 divalproex (DEPAKOTE ER) 500 MG 24 hr tablet    buPROPion (WELLBUTRIN XL) 300 MG 24 hr tablet    clonazePAM (KLONOPIN) 1 MG tablet    DISCONTINUED: clonazePAM (KLONOPIN) 1 MG tablet    Past Psychiatric History: Reviewed. Patient denies any history of psychiatric inpatient treatment or any suicidal attempt.  He has long history of mental illness.  In the past she had tried Celexa, Trileptal, Lamictal, Lexapro and Zyprexa.  He had a good response with  Lamictal but his insurance does not cover.  He had diarrhea with the Lexapro.  Past Medical History:  Past Medical History:  Diagnosis Date  . Anxiety   . Arthritis   . Bipolar I disorder, most recent episode depressed ([place]) 11/23/2014  . Depression   . Hemangioma 04/28/2015  . HIV disease ([place])   . Hypertension   . Testicular pain 04/13/2016  . Tremor 09/07/2015  . 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 No pertinent surgical history.  Family Psychiatric History: Reviewed.  Family History:  Family History  Problem Relation Age of Onset  . Cancer Father        colon cancer  . Alcohol abuse Father   . Dementia Father   . Heart disease Maternal Uncle   . Alcohol abuse Maternal Uncle   . Depression Mother   . Physical abuse Mother   . Bipolar disorder Sister   . Schizophrenia Sister   . OCD Sister   . Alcohol abuse Brother   . Drug abuse Brother   . Seizures Brother   . Alcohol abuse Maternal Aunt   . Depression Maternal Aunt   . Alcohol abuse Paternal Uncle   . Depression Paternal Uncle     Social History:  Social History   Social History  . Marital status: Single    Spouse name: N/A  . Number of children: N/A  . Years of education:  N/A   Social History Main Topics  . Smoking status: Never Smoker  . Smokeless tobacco: Never Used  . Alcohol use No  . Drug use: Yes    Frequency: 3.0 times per week    Types: Marijuana  . Sexual activity: Not Currently    Partners: Male    Birth control/ protection: Condom     Comment: declined condoms   Other Topics Concern  . None   Social History Narrative  . None    Allergies: No Known Allergies  Metabolic Disorder Labs: Lab Results  Component Value Date   HGBA1C 5.6 06/15/2016   MPG 111 04/21/2014   No results found for: PROLACTIN Lab Results  Component Value Date   CHOL 132 03/28/2016   TRIG 204 (H) 03/28/2016   HDL 41 03/28/2016   CHOLHDL 3.2 03/28/2016   VLDL 41 (H) 03/28/2016    LDLCALC 50 03/28/2016   LDLCALC 53 04/14/2015   Lab Results  Component Value Date   TSH 1.023 08/29/2010   TSH 0.731 12/19/2007    Therapeutic Level Labs: No results found for: LITHIUM Lab Results  Component Value Date   VALPROATE 27.5 (L) 08/14/2016   VALPROATE CANCELED 06/15/2016   No components found for:  CBMZ  Current Medications: Current Outpatient Prescriptions  Medication Sig Dispense Refill  . amLODipine (NORVASC) 10 MG tablet TAKE 1 TABLET BY MOUTH EVERY DAY 90 tablet 0  . atorvastatin (LIPITOR) 20 MG tablet TAKE 1 TABLET(20 MG) BY MOUTH DAILY 90 tablet 3  . bictegravir-emtricitabine-tenofovir AF (BIKTARVY) 50-200-25 MG TABS tablet Take 1 tablet by mouth daily. 30 tablet 11  . buPROPion (WELLBUTRIN XL) 300 MG 24 hr tablet TAKE 1 TABLET(150 MG) BY MOUTH DAILY 30 tablet 2  . clonazePAM (KLONOPIN) 1 MG tablet Take 1 tablet (1 mg total) by mouth at bedtime. 30 tablet 2  . divalproex (DEPAKOTE ER) 250 MG 24 hr tablet Take 1 tablet (250 mg total) by mouth at bedtime. 30 tablet 2  . ibuprofen (ADVIL,MOTRIN) 800 MG tablet Take 1 tablet (800 mg total) by mouth every 8 (eight) hours as needed. 90 tablet 5  . metoprolol succinate (TOPROL-XL) 25 MG 24 hr tablet Take 12.5 mg by mouth 2 (two) times daily.    . ondansetron (ZOFRAN) 8 MG tablet Take 1 tablet (8 mg total) by mouth every 8 (eight) hours as needed for nausea or vomiting. 60 tablet 3   No current facility-administered medications for this visit.      Musculoskeletal: Strength & Muscle Tone: within normal limits Gait & Station: normal Patient leans: N/A  Psychiatric Specialty Exam: Review of Systems  Constitutional: Positive for malaise/fatigue.  HENT: Negative.   Respiratory: Negative.   Cardiovascular: Negative.   Genitourinary: Negative.   Musculoskeletal: Negative.   Skin: Negative.   Neurological: Positive for tingling.    Blood pressure 122/78, pulse (!) 59, height 5\' 9"  (1.753 m), weight 162 lb 3.2 oz  (73.6 kg).Body mass index is 23.95 kg/m.  General Appearance: Casual  Eye Contact:  Good  Speech:  Clear and Coherent  Volume:  Normal  Mood:  Anxious and Tired  Affect:  Appropriate  Thought Process:  Goal Directed  Orientation:  Full (Time, Place, and Person)  Thought Content: Logical and Rumination   Suicidal Thoughts:  No  Homicidal Thoughts:  No  Memory:  Immediate;   Good Recent;   Good Remote;   Good  Judgement:  Good  Insight:  Good  Psychomotor Activity:  Tremor  Concentration:  Concentration: Fair and Attention Span: Good  Recall:  Good  Fund of Knowledge: Good  Language: Good  Akathisia:  No  Handed:  Right  AIMS (if indicated): not done  Assets:  Communication Skills Desire for Improvement Housing Resilience  ADL's:  Intact  Cognition: WNL  Sleep:  Fair   Screenings: PHQ2-9     Office Visit from 04/13/2016 in [facility] for Infectious Disease Office Visit from 04/28/2015 in [facility] for Infectious Disease Office Visit from 11/23/2014 in [facility] for [facility]ious Disease Office Visit from 09/30/2014 in [facility] for Infectious Disease Office Visit from 04/01/2014 in [facility] for Infectious Disease  PHQ-2 Total Score  6  5  6  4  2   PHQ-9 Total Score  10  15  21  19  9        Assessment and Plan: Bipolar disorder type I.  Anxiety disorder NOS.  I review his blood work results and other collateral information from providers.  His Depakote level is subtherapeutic.  He still have tremors and reducing Depakote did not help the tremors.  I recommended to try Depakote 500 mg at night to help his mood lability, insomnia and anxiety.  He will continue Wellbutrin XL 300 mg daily and Klonopin 1 mg at bedtime.  If his symptoms do not improve and we will consider switching his medication.  Discussed medication side effects and benefits.  Recommended to call us back if he has any question, concern  or if he feel worsening of the symptom.  Follow-up in 6 weeks.  Time spent 25 minutes.   Lynita Groseclose T., MD 11/06/2016, 10:36 AM

## 2016-11-12 ENCOUNTER — Other Ambulatory Visit: Payer: Self-pay | Admitting: Infectious Disease

## 2016-11-12 DIAGNOSIS — I10 Essential (primary) hypertension: Secondary | ICD-10-CM

## 2016-11-29 ENCOUNTER — Ambulatory Visit (INDEPENDENT_AMBULATORY_CARE_PROVIDER_SITE_OTHER): Payer: Medicare Other | Admitting: Infectious Disease

## 2016-11-29 ENCOUNTER — Ambulatory Visit (INDEPENDENT_AMBULATORY_CARE_PROVIDER_SITE_OTHER): Payer: Medicare Other | Admitting: Licensed Clinical Social Worker

## 2016-11-29 ENCOUNTER — Encounter: Payer: Self-pay | Admitting: Infectious Disease

## 2016-11-29 VITALS — BP 128/75 | HR 55 | Temp 97.9°F | Ht 69.0 in | Wt 160.0 lb

## 2016-11-29 DIAGNOSIS — Z113 Encounter for screening for infections with a predominantly sexual mode of transmission: Secondary | ICD-10-CM | POA: Diagnosis not present

## 2016-11-29 DIAGNOSIS — I10 Essential (primary) hypertension: Secondary | ICD-10-CM

## 2016-11-29 DIAGNOSIS — Z79899 Other long term (current) drug therapy: Secondary | ICD-10-CM | POA: Diagnosis not present

## 2016-11-29 DIAGNOSIS — B20 Human immunodeficiency virus [HIV] disease: Secondary | ICD-10-CM

## 2016-11-29 DIAGNOSIS — F331 Major depressive disorder, recurrent, moderate: Secondary | ICD-10-CM | POA: Diagnosis not present

## 2016-11-29 DIAGNOSIS — Z23 Encounter for immunization: Secondary | ICD-10-CM

## 2016-11-29 DIAGNOSIS — F329 Major depressive disorder, single episode, unspecified: Secondary | ICD-10-CM

## 2016-11-29 NOTE — Progress Notes (Signed)
Chief complaint: depressive symptoms and he was bothered by drop in CD4   Subjective:    Patient ID: Alexis Welch, male    DOB: 08-31-1960, 56 y.o.   MRN: [id number]  HPI  Alexis Welch is a 56 y.o. male who is doing superbly well on BIKTARVY  Lab Results  Component Value Date   HIV1RNAQUANT <20 DETECTED (A) 09/26/2016   HIV1RNAQUANT <20 NOT DETECTED 08/03/2016   HIV1RNAQUANT 80 (H) 03/28/2016   Lab Results  Component Value Date   CD4TABS 350 (L) 08/03/2016   CD4TABS 960 03/28/2016   CD4TABS 950 08/24/2015      He has been  seeing [name] from psychiatry who is managing his psychiatric issues along with counselling   Alexis Welch's viral load remains perfectly suppressed but his CD4 count dropped from 900s to 300s with maintenance of the same percent CD4. I suspect this had to do with some type of illness at the time or potentially another medication effect but certainly has nothing to do with his HIV itself. I tried to reassure him that if we recheck his labs today CD4 count would be back where it was before.  Alexis Welch has been struggling for years with depression and of course this news about his CD4 count being low or made him more anxious but I tried to calm him down. Also had immediately Alexis Welch for counseling.  Past Medical History:  Diagnosis Date  . Anxiety   . Arthritis   . Bipolar I disorder, most recent episode depressed ([place]) 11/23/2014  . Depression   . Hemangioma 04/28/2015  . HIV disease ([place])   . Hypertension   . Testicular pain 04/13/2016  . Tremor 09/07/2015  . 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  Family History  Problem Relation Age of Onset  . Cancer Father        colon cancer  . Alcohol abuse Father   . Dementia Father   . Heart disease Maternal Uncle   . Alcohol abuse Maternal Uncle   . Depression Mother   . Physical abuse Mother   . Bipolar disorder Sister   . Schizophrenia Sister   . OCD Sister    . Alcohol abuse Brother   . Drug abuse Brother   . Seizures Brother   . Alcohol abuse Maternal Aunt   . Depression Maternal Aunt   . Alcohol abuse Paternal Uncle   . Depression Paternal Uncle       Social History   Social History  . Marital status: Single    Spouse name: N/A  . Number of children: N/A  . Years of education: N/A   Social History Main Topics  . Smoking status: Never Smoker  . Smokeless tobacco: Never Used  . Alcohol use No  . Drug use: Yes    Frequency: 3.0 times per week    Types: Marijuana  . Sexual activity: Not Currently    Partners: Male    Birth control/ protection: Condom     Comment: declined condoms   Other Topics Concern  . None   Social History Narrative  . None    No Known Allergies   Current Outpatient Prescriptions:  .  amLODipine (NORVASC) 10 MG tablet, TAKE 1 TABLET BY MOUTH EVERY DAY, Disp: 90 tablet, Rfl: 3 .  bictegravir-emtricitabine-tenofovir AF (BIKTARVY) 50-200-25 MG TABS tablet, Take 1 tablet by mouth daily., Disp: 30 tablet, Rfl: 11 .  buPROPion (WELLBUTRIN XL) 300 MG 24  hr tablet, TAKE 1 TABLET(150 MG) BY MOUTH DAILY, Disp: 30 tablet, Rfl: 1 .  clonazePAM (KLONOPIN) 1 MG tablet, Take 1 tablet (1 mg total) by mouth at bedtime., Disp: 30 tablet, Rfl: 1 .  divalproex (DEPAKOTE ER) 500 MG 24 hr tablet, Take 1 tablet (500 mg total) by mouth at bedtime., Disp: 30 tablet, Rfl: 1 .  fluoruracil (CARAC) 0.5 % cream, Apply topically daily., Disp: , Rfl:  .  ibuprofen (ADVIL,MOTRIN) 800 MG tablet, Take 1 tablet (800 mg total) by mouth every 8 (eight) hours as needed., Disp: 90 tablet, Rfl: 5 .  metoprolol succinate (TOPROL-XL) 25 MG 24 hr tablet, Take 12.5 mg by mouth 2 (two) times daily., Disp: , Rfl:  .  ondansetron (ZOFRAN) 8 MG tablet, Take 1 tablet (8 mg total) by mouth every 8 (eight) hours as needed for nausea or vomiting., Disp: 60 tablet, Rfl: 3 .  atorvastatin (LIPITOR) 20 MG tablet, TAKE 1 TABLET(20 MG) BY MOUTH DAILY  (Patient not taking: Reported on 11/29/2016), Disp: 90 tablet, Rfl: 3   .Review of Systems  Constitutional: Negative for activity change, appetite change, chills, diaphoresis, fatigue, fever and unexpected weight change.  HENT: Negative for congestion, nosebleeds, postnasal drip, rhinorrhea, sinus pressure, sneezing, sore throat and trouble swallowing.   Eyes: Negative for photophobia and visual disturbance.  Respiratory: Negative for cough, chest tightness, shortness of breath, wheezing and stridor.   Cardiovascular: Negative for chest pain, palpitations and leg swelling.  Gastrointestinal: Negative for abdominal distention, abdominal pain, anal bleeding, blood in stool, constipation, diarrhea and vomiting.  Genitourinary: Negative for difficulty urinating, dysuria, flank pain and hematuria.  Musculoskeletal: Negative for back pain, gait problem, joint swelling and myalgias.  Skin: Negative for color change, pallor, rash and wound.  Neurological: Positive for tremors. Negative for dizziness, weakness and light-headedness.  Hematological: Negative for adenopathy. Does not bruise/bleed easily.  Psychiatric/Behavioral: Negative for agitation, confusion, decreased concentration and sleep disturbance. The patient is nervous/anxious.        Objective:   Physical Exam  Constitutional: He is oriented to person, place, and time. He appears well-developed and well-nourished. No distress.  HENT:  Head: Normocephalic and atraumatic.  Mouth/Throat: Oropharynx is clear and moist. No oropharyngeal exudate.  Eyes: Pupils are equal, round, and reactive to light. Conjunctivae and EOM are normal. No scleral icterus.  Neck: Normal range of motion. Neck supple. No JVD present.  Cardiovascular: Normal rate and regular rhythm.   Pulmonary/Chest: Effort normal and breath sounds normal. No respiratory distress.  Abdominal: He exhibits no distension.  Musculoskeletal: He exhibits no edema or tenderness.   Lymphadenopathy:    He has no cervical adenopathy.  Neurological: He is alert and oriented to person, place, and time. He displays tremor. He exhibits normal muscle tone. Coordination normal.  Skin: Skin is warm and dry. He is not diaphoretic. No erythema. No pallor.  Psychiatric: His behavior is normal. Judgment and thought content normal. His mood appears anxious.          Assessment & Plan:    MEQ:ASTMHDQQ with BIKTARVY, he needs to renew his ADAP in 3 months time and see me then but I can see him sooner if needed for his depressive symptoms  Lab Results  Component Value Date   HIV1RNAQUANT <20 DETECTED (A) 09/26/2016   HIV1RNAQUANT <20 NOT DETECTED 08/03/2016   HIV1RNAQUANT 80 (H) 03/28/2016    Lab Results  Component Value Date   CD4TABS 350 (L) 08/03/2016   CD4TABS 960 03/28/2016  CD4TABS 950 08/24/2015     CV risk: continue lipitor 20mg ,   Hyperlipidemia: see above  Bipolar, Depression: continue to see Alexis Welch and meds per [name]   HTN: continue  norvasc.  Vitals:   11/29/16 1027  BP: 128/75  Pulse: (!) 55  Temp: 97.9 F (36.6 C)   PTSD: seeingDr. Adele Schilder  I spent greater than 25 minutes with the patient including greater than 50% of time in face to face counsel of the patient Regarding his HIV nature of his labs, reassuring him that his drop in his CD4 count was in no way related to his HIV since used to history great job controlling this, also counseling with regards to his depressive symptoms and in coordination of his care with counselor.

## 2016-11-30 LAB — T-HELPER CELL (CD4) - (RCID CLINIC ONLY)
CD4 T CELL HELPER: 31 % — AB (ref 33–55)
CD4 T Cell Abs: 870 /uL (ref 400–2700)

## 2016-12-01 LAB — HIV-1 RNA QUANT-NO REFLEX-BLD
HIV 1 RNA Quant: <20 copies/mL
HIV-1 RNA QUANT, LOG: NOT DETECTED {Log_copies}/mL

## 2016-12-04 NOTE — BH Specialist Note (Signed)
Integrated Behavioral Health Initial Visit  MRN: [id number] Name: Alexis Welch  Number of [facility] Clinician visits:: 1/6 Session Start time: 10:55 am  Session End time: 11:33 am Total time: 40 minutes  Type of Service: [place] Interpretor:No. Interpretor Name and Language: N/A   Warm Hand Off Completed.       SUBJECTIVE: Alexis Welch is a 56 y.o. male accompanied by self Patient was referred by Dr. Tommy Welch for mood disorder.  Patient reports the following symptoms/concerns: Poor motivation to engage in activities, social isolation, and nightmares that someone is trying to kill him (has history of nightmares).  Patient feels like he has to depend on other people and feels like his life is over.  Patient reported that he does not communicate issues to people.  Patient was seeing previous therapist [name] until March 2018 and has not continued behavioral health services since then.  Patient reported history of Depression diagnosis and Bipolar I.  Patient has history of taking Wellbutrin, Depokote, and Klonopin.  Patient denied medications totally effective.  Severity of problem: mild  OBJECTIVE: Mood: Depressed and Affect: Appropriate Risk of harm to self or others: No plan to harm self or others  LIFE CONTEXT: Family and Social: Patient's mother was murdered when he was 32 and his father was not present in his life.  He then was raised by his maternal aunt, who struggled with Alcohol Use Disorder.  Patient is currently living with his cousin in [place]. [place] and feels frustrated.  ASSESSMENT: Patient is currently experiencing depressive symptoms and needs further assessment of symptoms and engagement in behavioral health services and medication management.    GOALS ADDRESSED: Patient will: 1. Reduce symptoms of: depression 2. Increase knowledge and/or ability of: coping skills, healthy habits and self-management  skills  3. Demonstrate ability to: Increase healthy adjustment to current life circumstances and Increase adequate support systems for patient/family  INTERVENTIONS: Interventions utilized: Supportive Counseling   PLAN: 1. Follow up with behavioral health clinician on : Wed Nov 14th at 10:15 am    Sande Rives, [facility]

## 2016-12-18 ENCOUNTER — Ambulatory Visit (HOSPITAL_COMMUNITY): Payer: Self-pay | Admitting: Psychiatry

## 2016-12-27 ENCOUNTER — Ambulatory Visit (INDEPENDENT_AMBULATORY_CARE_PROVIDER_SITE_OTHER): Payer: Medicare Other | Admitting: Licensed Clinical Social Worker

## 2016-12-27 ENCOUNTER — Encounter (HOSPITAL_COMMUNITY): Payer: Self-pay | Admitting: Psychiatry

## 2016-12-27 ENCOUNTER — Ambulatory Visit (HOSPITAL_COMMUNITY): Payer: Medicare Other | Admitting: Psychiatry

## 2016-12-27 DIAGNOSIS — Z79899 Other long term (current) drug therapy: Secondary | ICD-10-CM

## 2016-12-27 DIAGNOSIS — Z813 Family history of other psychoactive substance abuse and dependence: Secondary | ICD-10-CM

## 2016-12-27 DIAGNOSIS — Z818 Family history of other mental and behavioral disorders: Secondary | ICD-10-CM

## 2016-12-27 DIAGNOSIS — F319 Bipolar disorder, unspecified: Secondary | ICD-10-CM | POA: Diagnosis not present

## 2016-12-27 DIAGNOSIS — F322 Major depressive disorder, single episode, severe without psychotic features: Secondary | ICD-10-CM

## 2016-12-27 DIAGNOSIS — Z811 Family history of alcohol abuse and dependence: Secondary | ICD-10-CM | POA: Diagnosis not present

## 2016-12-27 DIAGNOSIS — F419 Anxiety disorder, unspecified: Secondary | ICD-10-CM | POA: Diagnosis not present

## 2016-12-27 MED ORDER — CLONAZEPAM 1 MG PO TABS: 1.0000 mg | ORAL_TABLET | Freq: Every day | ORAL | 2 refills | Status: DC

## 2016-12-27 MED ORDER — DIVALPROEX SODIUM ER 500 MG PO TB24: 500.0000 mg | ORAL_TABLET | Freq: Every day | ORAL | 2 refills | Status: DC

## 2016-12-27 MED ORDER — BUPROPION HCL ER (XL) 300 MG PO TB24
ORAL_TABLET | ORAL | 2 refills | Status: DC
Start: 2016-12-27 — End: 2017-03-24

## 2016-12-27 NOTE — Progress Notes (Signed)
[facility] MD/PA/NP OP Progress Note  12/27/2016 1:00 PM Alexis Welch  MRN:  [id number]  Chief Complaint: I have no more tremors but sometimes I have nausea.  I am taking medication.  I am sleeping better.  HPI: Alexis Welch came for his follow-up appointment.  He is now taking Depakote at bedtime which is helping his sleep.  He continues to have tired feeling but his sleep is improved.  He denies any irritability, anger, mania or any psychosis.  He is concerned about his low T cells but he is happy that his HIV virus is undetected.  He started seeing a new therapist Sande Rives.  His tremors are improved but he has nausea.  He does not want to try a different medication because he feels medicine working very well for him.  His appetite is okay.  His energy level is fair.  Denies drinking alcohol or using any illegal substances.  He denies any paranoia, hallucination, mania or any psychosis.  He is going to [place] for Thanksgiving to his knees.  Visit Diagnosis:    ICD-10-CM   1. Bipolar 1 disorder (HCC) F31.9 divalproex (DEPAKOTE ER) 500 MG 24 hr tablet    clonazePAM (KLONOPIN) 1 MG tablet    buPROPion (WELLBUTRIN XL) 300 MG 24 hr tablet    Past Psychiatric History: Reviewed. Patient denies any history of psychiatric inpatient treatment or any suicidal attempt. He has long history of mental illness. In the past she had tried Celexa, Trileptal, Lamictal, Lexapro and Zyprexa. He had a good response with Lamictal but his insurance does not cover. He had diarrhea with the Lexapro.   Past Medical History:  Past Medical History:  Diagnosis Date  . Anxiety   . Arthritis   . Bipolar I disorder, most recent episode depressed ([place]) 11/23/2014  . Depression   . Hemangioma 04/28/2015  . HIV disease ([place])   . Hypertension   . Testicular pain 04/13/2016  . Tremor 09/07/2015  . 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  Family Psychiatric History:  Reviewed.  Family History:  Family History  Problem Relation Age of Onset  . Cancer Father        colon cancer  . Alcohol abuse Father   . Dementia Father   . Heart disease Maternal Uncle   . Alcohol abuse Maternal Uncle   . Depression Mother   . Physical abuse Mother   . Bipolar disorder Sister   . Schizophrenia Sister   . OCD Sister   . Alcohol abuse Brother   . Drug abuse Brother   . Seizures Brother   . Alcohol abuse Maternal Aunt   . Depression Maternal Aunt   . Alcohol abuse Paternal Uncle   . Depression Paternal Uncle     Social History:  Social History   Socioeconomic History  . Marital status: Single    Spouse name: Not on file  . Number of children: Not on file  . Years of education: Not on file  . Highest education level: Not on file  Social Needs  . Financial resource strain: Not on file  . Food insecurity - worry: Not on file  . Food insecurity - inability: Not on file  . Transportation needs - medical: Not on file  . Transportation needs - non-medical: Not on file  Occupational History  . Not on file  Tobacco Use  . Smoking status: Never Smoker  . Smokeless tobacco: Never Used  Substance and Sexual  Activity  . Alcohol use: No    Alcohol/week: 0.0 oz  . Drug use: Yes    Frequency: 3.0 times per week    Types: Marijuana  . Sexual activity: Not Currently    Partners: Male    Birth control/protection: Condom    Comment: declined condoms  Other Topics Concern  . Not on file  Social History Narrative  . Not on file    Allergies: No Known Allergies  Metabolic Disorder Labs: Lab Results  Component Value Date   HGBA1C 5.6 06/15/2016   MPG 111 04/21/2014   No results found for: PROLACTIN Lab Results  Component Value Date   CHOL 132 03/28/2016   TRIG 204 (H) 03/28/2016   HDL 41 03/28/2016   CHOLHDL 3.2 03/28/2016   VLDL 41 (H) 03/28/2016   LDLCALC 50 03/28/2016   LDLCALC 53 04/14/2015   Lab Results  Component Value Date   TSH 1.023  08/29/2010   TSH 0.731 12/19/2007    Therapeutic Level Labs: No results found for: LITHIUM Lab Results  Component Value Date   VALPROATE 27.5 (L) 08/14/2016   VALPROATE CANCELED 06/15/2016   No components found for:  CBMZ  Current Medications: Current Outpatient Medications  Medication Sig Dispense Refill  . amLODipine (NORVASC) 10 MG tablet TAKE 1 TABLET BY MOUTH EVERY DAY 90 tablet 3  . atorvastatin (LIPITOR) 20 MG tablet TAKE 1 TABLET(20 MG) BY MOUTH DAILY (Patient not taking: Reported on 11/29/2016) 90 tablet 3  . bictegravir-emtricitabine-tenofovir AF (BIKTARVY) 50-200-25 MG TABS tablet Take 1 tablet by mouth daily. 30 tablet 11  . buPROPion (WELLBUTRIN XL) 300 MG 24 hr tablet TAKE 1 TABLET(150 MG) BY MOUTH DAILY 30 tablet 1  . clonazePAM (KLONOPIN) 1 MG tablet Take 1 tablet (1 mg total) by mouth at bedtime. 30 tablet 1  . divalproex (DEPAKOTE ER) 500 MG 24 hr tablet Take 1 tablet (500 mg total) by mouth at bedtime. 30 tablet 1  . fluoruracil (CARAC) 0.5 % cream Apply topically daily.    [name] ibuprofen (ADVIL,MOTRIN) 800 MG tablet Take 1 tablet (800 mg total) by mouth every 8 (eight) hours as needed. 90 tablet 5  . metoprolol succinate (TOPROL-XL) 25 MG 24 hr tablet Take 12.5 mg by mouth 2 (two) times daily.    . ondansetron (ZOFRAN) 8 MG tablet Take 1 tablet (8 mg total) by mouth every 8 (eight) hours as needed for nausea or vomiting. 60 tablet 3   No current facility-administered medications for this visit.      Musculoskeletal: Strength & Muscle Tone: within normal limits Gait & Station: normal Patient leans: N/A  Psychiatric Specialty Exam: ROS  There were no vitals taken for this visit.There is no height or weight on file to calculate BMI.  General Appearance: Casual  Eye Contact:  Good  Speech:  Clear and Coherent  Volume:  Normal  Mood:  Euthymic  Affect:  Appropriate  Thought Process:  Goal Directed  Orientation:  Full (Time, Place, and Person)  Thought  Content: Logical and Rumination   Suicidal Thoughts:  No  Homicidal Thoughts:  No  Memory:  Immediate;   Good Recent;   Good Remote;   Good  Judgement:  Good  Insight:  Good  Psychomotor Activity:  Normal  Concentration:  Concentration: Good and Attention Span: Good  Recall:  Good  Fund of Knowledge: Good  Language: Good  Akathisia:  No  Handed:  Right  AIMS (if indicated): not done  Assets:  Communication Skills  Desire for Improvement Housing Social Support  ADL's:  Intact  Cognition: WNL  Sleep:  Good   Screenings: [place] from 12/27/2016 in [facility] for Infectious Disease Office Visit from 11/29/2016 in [facility] for Infectious Disease Office Visit from 04/13/2016 in [facility] for [facility] Disease Office Visit from 04/28/2015 in [facility] for Infectious Disease Office Visit from 11/23/2014 in [facility] for Infectious Disease  PHQ-2 Total Score  6  6  6  5  6   PHQ-9 Total Score  21  16  10  15  21        Assessment and Plan: Bipolar disorder type I.  Anxiety disorder NOS.  Patient doing better since he is taking all his Depakote at bedtime.  His mood and irritability is improved.  Discussed medication side effects and benefits.  Continue Depakote 500 mg at bedtime, Klonopin 1 mg at bedtime and Wellbutrin XL 300 mg daily.  Discussed medication side effects and benefits.  Recommended to call us back if he has any question or any concern.  He will continue counseling with Sande Rives.  Follow-up in 3 months.   Vandy Fong T., MD 12/27/2016, 1:00 PM

## 2016-12-27 NOTE — BH Specialist Note (Signed)
Integrated Behavioral Health Follow Up Visit  MRN: [id number] Name: Alexis Welch  Number of [facility] Clinician visits: 2/6 Session Start time: 10:20 am  Session End time: 11:25 am Total time: 1 hour  Type of Service: [place] Interpretor:No. Interpretor Name and Language: N/A  SUBJECTIVE: Alexis Welch is a 56 y.o. male accompanied by self Patient was referred by Dr. Tommy Medal for mood disorder.  Patient completed the PHQ-9 and scored a 21 evidencing severe depressive symptoms.  Most of the patient's symptoms are experienced everyday and patient reported that he is napping 1-3 hours during the day.  Patient also related napping to lack of exercise and stated that he does not feel well enough to exercise due to nausea.  Patient reported that nausea symptoms have reportedly gotten worse over the past year, where he is only able to tolerate chicken noodle soup.  Patient verbalized conflicted motivation and energy levels but was receptive to education provided regarding activity scheduling.  Portal introduced concept of activity scheduling, provided the patient with multiple copies of a daily schedule, and asked the patient to begin to plan out his days in advance to increase engagement and provide reinforcement of accomplishment.  Patient presented with difficulty exploring and verbalizing feelings related to recent loss of home and job.  Patient's thought process was circumstantial and tangential and he talked around his feelings.  Patient was able to explore his early experiences and patterns related to feelings and being taught to stuff and ignore feelings.  With assistance from a feelings list, patient was able to explore his feelings related to his recent loss.  Other themes that the patient presented was being a protector and helping others and the world being an unsafe place which led to a lack of desire in being vulnerable with others  and having limited social interaction.  Patient verbalized displeasure and decreased life satisfaction with current life.  Severity of problem: severe  OBJECTIVE: Mood: Anxious and Depressed and Affect: Appropriate Risk of harm to self or others: No plan to harm self or others  ASSESSMENT: Patient is currently experiencing severe depressive symptoms and may benefit from behavioral health services and medication management.  GOALS ADDRESSED: Patient will: 1.  Reduce symptoms of: depression  2.  Increase knowledge and/or ability of: coping skills, healthy habits and self-management skills  3.  Demonstrate ability to: Increase healthy adjustment to current life circumstances and Increase adequate support systems for patient/family  INTERVENTIONS: Interventions utilized:  Behavioral Activation and Supportive Counseling Standardized Assessments completed: PHQ 9   Depression screen PHQ 2/9 12/27/2016  Decreased Interest 3  Down, Depressed, Hopeless 3  PHQ - 2 Score 6  Altered sleeping 3  Tired, decreased energy 3  Change in appetite 3  Feeling bad or failure about yourself  3  Trouble concentrating 0  Moving slowly or fidgety/restless 0  Suicidal thoughts 3  PHQ-9 Score 21  Difficult doing work/chores Extremely dIfficult  Some recent data might be hidden    PLAN: 1. Follow up with behavioral health clinician on : Wed Nov 14th at 10:15 am 2. Behavioral recommendations: Use activity scheduling to plan day in advance to increase social engagement and physical activity.  Begin to think about life and envision desired future. 3. At next session continue to expand patient's feeling vocabulary and address feelings, review progress with activity scheduling, process vision for life, and educate on negative thinking and depression.   Sande Rives, [facility]

## 2017-01-24 ENCOUNTER — Ambulatory Visit: Payer: Self-pay | Admitting: Licensed Clinical Social Worker

## 2017-02-15 ENCOUNTER — Ambulatory Visit (INDEPENDENT_AMBULATORY_CARE_PROVIDER_SITE_OTHER): Payer: Medicare Other | Admitting: Licensed Clinical Social Worker

## 2017-02-15 ENCOUNTER — Other Ambulatory Visit (HOSPITAL_COMMUNITY)
Admission: RE | Admit: 2017-02-15 | Discharge: 2017-02-15 | Disposition: A | Discharge status: Home / Self Care | Payer: Medicare Other | Source: Ambulatory Visit | Attending: Infectious Disease | Admitting: Infectious Disease

## 2017-02-15 ENCOUNTER — Other Ambulatory Visit: Payer: Medicare Other

## 2017-02-15 DIAGNOSIS — Z113 Encounter for screening for infections with a predominantly sexual mode of transmission: Secondary | ICD-10-CM | POA: Insufficient documentation

## 2017-02-15 DIAGNOSIS — Z79899 Other long term (current) drug therapy: Secondary | ICD-10-CM | POA: Diagnosis not present

## 2017-02-15 DIAGNOSIS — B20 Human immunodeficiency virus [HIV] disease: Secondary | ICD-10-CM | POA: Diagnosis present

## 2017-02-15 DIAGNOSIS — F322 Major depressive disorder, single episode, severe without psychotic features: Secondary | ICD-10-CM

## 2017-02-15 NOTE — Progress Notes (Signed)
Integrated Behavioral Health Follow Up Visit  MRN: [id number] Name: Alexis Welch  Number of [place] Clinician visits: 3/6 Session Start time: 10:34 am  Session End time: 11:50 am Total time: 1 hour 20 mins  Type of Service: [place] Interpretor:No. Interpretor Name and Language: N/A  SUBJECTIVE: Alexis Welch is a 57 y.o. male accompanied by self.  Patient was referred by Dr. Tommy Medal for mood disorder.    Patient and [facility] reviewed his current symptoms and patient reported reduction of symptoms.  Patient reported that he is currently on a routine of physical activity and that he is not napping as much due to fatigue.  Patient related experience of his fatigue due to HIV and low CD4 count.  Patient also reported improvement with increased activity.  Although he denied that he is completing the activity scheduling sheets provided by the [facility], he reported that he scheduling his day in his mind.  After eating breakfast he is getting into the car and driving around to prevent napping.  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  Patient completed the PHQ-9 again and showed a reduction of score.  Patient evidenced symptom improvement with anhedonia and depressed mood.  The following symptoms remained the same: insomnia, fatigue, poor appetite, and feeling like a failure. Patient related fatigue to medical issue.  Patient shared factors contributing to insomnia and was receptive to helpful ways to address.  In reviewing patient feeling like a failure, patient verbalized that he is unworthy of love and verbalized guilt over loss of finances.  Patient was asked to complete a homework assignment of reflecting on his personal traits, likes/dislikes, relationships, resources and possibilities, to be able to describe himself, in the present.   Patient also shared that he is receiving psychiatric services from Dr. Carroll Sage at [facility] and meets with him every three months for prescription renewal.  Patient denied seeing a counselor there.  [facility] provided the following interventions: Reviewed current symptoms on the PHQ-9 and reinforced positive changes and encouraged to continue.  Educated on sleep hygiene and provided a hand out.  Reviewed emotional hurts due to past romantic relationships and lack of space and receptiveness to begin dating currently.  Severity of problem: severe  OBJECTIVE: Mood: Anxious and Affect: Appropriate Risk of harm to self or others: No plan to harm self or others  ASSESSMENT: Patient is currently experiencing depressive symptoms and may benefit from behavioral health services and medication management. Patient has shown symptom improvement with lessening of depression.  GOALS ADDRESSED: Patient will: 1.  Reduce symptoms of: depression  2.  Increase knowledge and/or ability of: coping skills, healthy habits and self-management skills 3.  Demonstrate ability to: Increase healthy adjustment to current life circumstances and Increase adequate support systems for patient  INTERVENTIONS: Interventions utilized: Behavioral Activation and Supportive Counseling Standardized Assessments completed: PHQ 9  Depression screen [facility] 2/9 02/15/2017 12/27/2016 11/29/2016  Decreased Interest 1 3 3   Down, Depressed, Hopeless 1 3 3   PHQ - 2 Score 2 6 6   Altered sleeping 2 3 3   Tired, decreased energy 2 3 3   Change in appetite 3 3 3   Feeling bad or failure about yourself  3 3 1   Trouble concentrating 0 0 0  Moving slowly or fidgety/restless 1 0 0  Suicidal thoughts 0 3 0  PHQ-9 Score 13 21 16   Difficult doing  work/chores Very difficult Extremely dIfficult Extremely dIfficult  Some recent data might be hidden      PLAN: 1. Follow up with behavioral health clinician on : Tues March 12th at 8:45 am 2. At next  session will address negative self-concept and feeling like a failure  Sande Rives, [facility]

## 2017-02-15 NOTE — BH Specialist Note (Signed)
Integrated Behavioral Health Follow Up Visit  MRN: [id number] Name: Alexis Welch  Number of [place] Clinician visits: 3/6 Session Start time: 10:34 am  Session End time: 11:50 am Total time: 1 hour 20 mins  Type of Service: Long Interpretor:No. Interpretor Name and Language: N/A  ** See second note for encounter on 02/15/17**  Sande Rives, [facility]

## 2017-02-16 LAB — CBC WITH DIFFERENTIAL/PLATELET
BASOS PCT: 0.4 %
Basophils Absolute: 31 cells/uL (ref 0–200)
EOS ABS: 62 {cells}/uL (ref 15–500)
Eosinophils Relative: 0.8 %
HCT: 47.9 % (ref 38.5–50.0)
HEMOGLOBIN: 17 g/dL (ref 13.2–17.1)
Lymphs Abs: 2886 cells/uL (ref 850–3900)
MCH: 32.3 pg (ref 27.0–33.0)
MCHC: 35.5 g/dL (ref 32.0–36.0)
MCV: 91.1 fL (ref 80.0–100.0)
MONOS PCT: 9.1 %
MPV: 10.9 fL (ref 7.5–12.5)
NEUTROS ABS: 4111 {cells}/uL (ref 1500–7800)
Neutrophils Relative %: 52.7 %
Platelets: 186 10*3/uL (ref 140–400)
RBC: 5.26 10*6/uL (ref 4.20–5.80)
RDW: 13.1 % (ref 11.0–15.0)
Total Lymphocyte: 37 %
WBC: 7.8 10*3/uL (ref 3.8–10.8)
WBCMIX: 710 {cells}/uL (ref 200–950)

## 2017-02-16 LAB — COMPLETE METABOLIC PANEL WITH GFR
AG Ratio: 2 (calc) (ref 1.0–2.5)
ALBUMIN MSPROF: 4.5 g/dL (ref 3.6–5.1)
ALKALINE PHOSPHATASE (APISO): 78 U/L (ref 40–115)
ALT: 22 U/L (ref 9–46)
AST: 16 U/L (ref 10–35)
BUN: 14 mg/dL (ref 7–25)
CO2: 29 mmol/L (ref 20–32)
CREATININE: 1.26 mg/dL (ref 0.70–1.33)
Calcium: 9.2 mg/dL (ref 8.6–10.3)
Chloride: 103 mmol/L (ref 98–110)
GFR, Est African American: 72 mL/min/{1.73_m2} (ref >=60)
GFR, Est Non African American: 62 mL/min/{1.73_m2} (ref >=60)
GLUCOSE: 94 mg/dL (ref 65–99)
Globulin: 2.3 g/dL (calc) (ref 1.9–3.7)
Potassium: 4 mmol/L (ref 3.5–5.3)
Sodium: 140 mmol/L (ref 135–146)
TOTAL PROTEIN: 6.8 g/dL (ref 6.1–8.1)
Total Bilirubin: 0.6 mg/dL (ref 0.2–1.2)

## 2017-02-16 LAB — T-HELPER CELL (CD4) - (RCID CLINIC ONLY)
CD4 % Helper T Cell: 28 % — ABNORMAL LOW (ref 33–55)
CD4 T CELL ABS: 890 /uL (ref 400–2700)

## 2017-02-16 LAB — LIPID PANEL
CHOL/HDL RATIO: 5.5 (calc) — AB (ref <5.0)
CHOLESTEROL: 219 mg/dL — AB (ref <200)
HDL: 40 mg/dL — ABNORMAL LOW (ref >40)
Non-HDL Cholesterol (Calc): 179 mg/dL (calc) — ABNORMAL HIGH (ref <130)
Triglycerides: 453 mg/dL — ABNORMAL HIGH (ref <150)

## 2017-02-16 LAB — URINE CYTOLOGY ANCILLARY ONLY
CHLAMYDIA, DNA PROBE: NEGATIVE
Neisseria Gonorrhea: NEGATIVE

## 2017-02-16 LAB — RPR: RPR: NONREACTIVE

## 2017-02-19 LAB — HIV-1 RNA QUANT-NO REFLEX-BLD
HIV 1 RNA QUANT: NOT DETECTED {copies}/mL
HIV-1 RNA Quant, Log: <1.3 Log copies/mL

## 2017-03-01 ENCOUNTER — Encounter: Payer: Self-pay | Admitting: Infectious Disease

## 2017-03-01 ENCOUNTER — Ambulatory Visit (INDEPENDENT_AMBULATORY_CARE_PROVIDER_SITE_OTHER): Payer: Medicare Other | Admitting: Infectious Disease

## 2017-03-01 VITALS — BP 137/79 | HR 51 | Temp 97.7°F | Ht 69.0 in | Wt 160.0 lb

## 2017-03-01 DIAGNOSIS — B20 Human immunodeficiency virus [HIV] disease: Secondary | ICD-10-CM | POA: Diagnosis not present

## 2017-03-01 DIAGNOSIS — F319 Bipolar disorder, unspecified: Secondary | ICD-10-CM

## 2017-03-01 DIAGNOSIS — F4310 Post-traumatic stress disorder, unspecified: Secondary | ICD-10-CM

## 2017-03-01 DIAGNOSIS — I10 Essential (primary) hypertension: Secondary | ICD-10-CM

## 2017-03-01 NOTE — Progress Notes (Signed)
Chief complaint: pruritic lesion on his arm   Subjective:    Patient ID: Alexis Welch, male    DOB: 04-12-1960, 57 y.o.   MRN: [id number]  HPI  Alexis Welch is a 57 y.o. male who is doing superbly well on BIKTARVY. He is on a myriad of antidepressants under care of [name] and on the whole feels better.   Lab Results  Component Value Date   HIV1RNAQUANT <20 NOT DETECTED 02/15/2017   HIV1RNAQUANT <20 NOT DETECTED 11/29/2016   HIV1RNAQUANT <20 DETECTED (A) 09/26/2016   Lab Results  Component Value Date   CD4TABS 890 02/15/2017   CD4TABS 870 11/29/2016   CD4TABS 350 (L) 08/03/2016    He has had an area on his arm that has been itching for several weeks and is hypopigmented. He has not tried anything OTC for it. It is intensely pruritic at times.   Past Medical History:  Diagnosis Date  . Anxiety   . Arthritis   . Bipolar I disorder, most recent episode depressed ([place]) 11/23/2014  . Depression   . Hemangioma 04/28/2015  . HIV disease ([place])   . Hypertension   . Testicular pain 04/13/2016  . Tremor 09/07/2015  . 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  Family History  Problem Relation Age of Onset  . Cancer Father        colon cancer  . Alcohol abuse Father   . Dementia Father   . Heart disease Maternal Uncle   . Alcohol abuse Maternal Uncle   . Depression Mother   . Physical abuse Mother   . Bipolar disorder Sister   . Schizophrenia Sister   . OCD Sister   . Alcohol abuse Brother   . Drug abuse Brother   . Seizures Brother   . Alcohol abuse Maternal Aunt   . Depression Maternal Aunt   . Alcohol abuse Paternal Uncle   . Depression Paternal Uncle       Social History   Socioeconomic History  . Marital status: Single    Spouse name: None  . Number of children: None  . Years of education: None  . Highest education level: None  Social Needs  . Financial resource strain: None  . Food insecurity - worry: None    . Food insecurity - inability: None  . Transportation needs - medical: None  . Transportation needs - non-medical: None  Occupational History  . None  Tobacco Use  . Smoking status: Never Smoker  . Smokeless tobacco: Never Used  Substance and Sexual Activity  . Alcohol use: No    Alcohol/week: 0.0 oz  . Drug use: Yes    Frequency: 3.0 times per week    Types: Marijuana  . Sexual activity: Not Currently    Partners: Male    Birth control/protection: Condom    Comment: declined condoms  Other Topics Concern  . None  Social History Narrative  . None    No Known Allergies   Current Outpatient Medications:  .  amLODipine (NORVASC) 10 MG tablet, TAKE 1 TABLET BY MOUTH EVERY DAY, Disp: 90 tablet, Rfl: 3 .  atorvastatin (LIPITOR) 20 MG tablet, TAKE 1 TABLET(20 MG) BY MOUTH DAILY, Disp: 90 tablet, Rfl: 3 .  bictegravir-emtricitabine-tenofovir AF (BIKTARVY) 50-200-25 MG TABS tablet, Take 1 tablet by mouth daily., Disp: 30 tablet, Rfl: 11 .  buPROPion (WELLBUTRIN XL) 300 MG 24 hr tablet, TAKE 1 TABLET(150 MG) BY MOUTH DAILY, Disp: 30  tablet, Rfl: 2 .  clonazePAM (KLONOPIN) 1 MG tablet, Take 1 tablet (1 mg total) at bedtime by mouth., Disp: 30 tablet, Rfl: 2 .  divalproex (DEPAKOTE ER) 500 MG 24 hr tablet, Take 1 tablet (500 mg total) at bedtime by mouth., Disp: 30 tablet, Rfl: 2 .  fluoruracil (CARAC) 0.5 % cream, Apply topically daily., Disp: , Rfl:  .  ibuprofen (ADVIL,MOTRIN) 800 MG tablet, Take 1 tablet (800 mg total) by mouth every 8 (eight) hours as needed., Disp: 90 tablet, Rfl: 5 .  metoprolol succinate (TOPROL-XL) 25 MG 24 hr tablet, Take 12.5 mg by mouth 2 (two) times daily., Disp: , Rfl:  .  ondansetron (ZOFRAN) 8 MG tablet, Take 1 tablet (8 mg total) by mouth every 8 (eight) hours as needed for nausea or vomiting., Disp: 60 tablet, Rfl: 3   .Review of Systems  Constitutional: Negative for activity change, appetite change, chills, diaphoresis, fatigue, fever and unexpected  weight change.  HENT: Negative for congestion, nosebleeds, postnasal drip, rhinorrhea, sinus pressure, sneezing, sore throat and trouble swallowing.   Eyes: Negative for photophobia and visual disturbance.  Respiratory: Negative for cough, chest tightness, shortness of breath, wheezing and stridor.   Cardiovascular: Negative for chest pain, palpitations and leg swelling.  Gastrointestinal: Negative for abdominal distention, abdominal pain, anal bleeding, blood in stool, constipation, diarrhea and vomiting.  Genitourinary: Negative for difficulty urinating, dysuria, flank pain and hematuria.  Musculoskeletal: Negative for back pain, gait problem, joint swelling and myalgias.  Skin: Positive for rash. Negative for color change, pallor and wound.  Neurological: Positive for tremors. Negative for dizziness, weakness and light-headedness.  Hematological: Negative for adenopathy. Does not bruise/bleed easily.  Psychiatric/Behavioral: Negative for agitation, confusion, decreased concentration and sleep disturbance. The patient is nervous/anxious.        Objective:   Physical Exam  Constitutional: He is oriented to person, place, and time. He appears well-developed and well-nourished. No distress.  HENT:  Head: Normocephalic and atraumatic.  Mouth/Throat: Oropharynx is clear and moist. No oropharyngeal exudate.  Eyes: Conjunctivae and EOM are normal. Pupils are equal, round, and reactive to light. No scleral icterus.  Neck: Normal range of motion. Neck supple. No JVD present.  Cardiovascular: Normal rate and regular rhythm.  Pulmonary/Chest: Effort normal and breath sounds normal. No respiratory distress.  Abdominal: He exhibits no distension.  Musculoskeletal: He exhibits no edema or tenderness.  Lymphadenopathy:    He has no cervical adenopathy.  Neurological: He is alert and oriented to person, place, and time. He displays tremor. He exhibits normal muscle tone. Coordination normal.  Skin:  Skin is warm and dry. Rash noted. He is not diaphoretic. No erythema. No pallor.  Psychiatric: His behavior is normal. Judgment and thought content normal. His mood appears anxious.  Nursing note and vitals reviewed.  Area of possible tinea 1/417/19:           Assessment & Plan:  New rash: I would like him to try OTC topical clotrimazole  PPI:RJJOACZY with BIKTARVY, Renew HMAP  Lab Results  Component Value Date   HIV1RNAQUANT <20 NOT DETECTED 02/15/2017   HIV1RNAQUANT <20 NOT DETECTED 11/29/2016   HIV1RNAQUANT <20 DETECTED (A) 09/26/2016    Lab Results  Component Value Date   CD4TABS 890 02/15/2017   CD4TABS 870 11/29/2016   CD4TABS 350 (L) 08/03/2016     CV risk: continue lipitor 11m,   Hyperlipidemia: see above  Bipolar, Depression: continue to [facility]  [facility].  HTN: continue  norvasc.  Vitals:   03/01/17 1428  BP: 137/79  Pulse: (!) 51  Temp: 97.7 F (36.5 C)   PTSD: seeing [name]

## 2017-03-07 ENCOUNTER — Encounter: Payer: Self-pay | Admitting: Infectious Disease

## 2017-03-24 ENCOUNTER — Other Ambulatory Visit (HOSPITAL_COMMUNITY): Payer: Self-pay

## 2017-03-24 DIAGNOSIS — F319 Bipolar disorder, unspecified: Secondary | ICD-10-CM

## 2017-03-24 MED ORDER — DIVALPROEX SODIUM ER 500 MG PO TB24: 500.0000 mg | ORAL_TABLET | Freq: Every day | ORAL | 0 refills | Status: DC

## 2017-03-24 MED ORDER — BUPROPION HCL ER (XL) 300 MG PO TB24: ORAL_TABLET | ORAL | 0 refills | Status: DC

## 2017-03-29 ENCOUNTER — Ambulatory Visit: Payer: Medicare Other

## 2017-03-29 ENCOUNTER — Encounter (HOSPITAL_COMMUNITY): Payer: Self-pay | Admitting: Psychiatry

## 2017-03-29 ENCOUNTER — Ambulatory Visit (HOSPITAL_COMMUNITY): Payer: Medicare Other | Admitting: Psychiatry

## 2017-03-29 DIAGNOSIS — F419 Anxiety disorder, unspecified: Secondary | ICD-10-CM | POA: Diagnosis not present

## 2017-03-29 DIAGNOSIS — Z810 Family history of intellectual disabilities: Secondary | ICD-10-CM

## 2017-03-29 DIAGNOSIS — Z811 Family history of alcohol abuse and dependence: Secondary | ICD-10-CM

## 2017-03-29 DIAGNOSIS — Z818 Family history of other mental and behavioral disorders: Secondary | ICD-10-CM | POA: Diagnosis not present

## 2017-03-29 DIAGNOSIS — F1290 Cannabis use, unspecified, uncomplicated: Secondary | ICD-10-CM

## 2017-03-29 DIAGNOSIS — Z813 Family history of other psychoactive substance abuse and dependence: Secondary | ICD-10-CM | POA: Diagnosis not present

## 2017-03-29 DIAGNOSIS — F319 Bipolar disorder, unspecified: Secondary | ICD-10-CM

## 2017-03-29 MED ORDER — CLONAZEPAM 1 MG PO TABS: 1.0000 mg | ORAL_TABLET | Freq: Every day | ORAL | 2 refills | Status: DC

## 2017-03-29 MED ORDER — BUPROPION HCL ER (XL) 300 MG PO TB24: ORAL_TABLET | ORAL | 2 refills | Status: DC

## 2017-03-29 MED ORDER — DIVALPROEX SODIUM ER 500 MG PO TB24: 500.0000 mg | ORAL_TABLET | Freq: Every day | ORAL | 2 refills | Status: DC

## 2017-03-29 NOTE — Progress Notes (Signed)
[facility] MD/PA/NP OP Progress Note  03/29/2017 10:50 AM Alexis Welch  MRN:  [id number]  Chief Complaint: I am doing good.  I am taking medication.    HPI: Alexis Welch came for his follow-up appointment.  He is feeling better since taking the Depakote.  He is sleeping good.  Sometime he sleeps too much but his mood is a stable.  He denies any irritability, anger, mania or any psychosis.  He recently seen his HIV physician and he was pleased that his virus is undetected.  He is seeing therapist Sande Rives.  He is not complaining of tremors anymore.  He feels his medicine is working.  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  He was pleased because he make money recently.  Patient denies any crying spells or any feeling of hopelessness or worthlessness.  He has a good holidays.  Patient denies drinking alcohol or using any illegal substances.  Visit Diagnosis:    ICD-10-CM   1. Bipolar 1 disorder (HCC) F31.9 divalproex (DEPAKOTE ER) 500 MG 24 hr tablet    clonazePAM (KLONOPIN) 1 MG tablet    buPROPion (WELLBUTRIN XL) 300 MG 24 hr tablet    Past Psychiatric History: Reviewed. Patient denies any history of psychiatric inpatient treatment or any suicidal attempt. He has long history of mental illness. In the past she had tried Celexa, Trileptal, Lamictal, Lexapro and Zyprexa. He had a good response with Lamictal but his insurance does not cover. He had diarrhea with the Lexapro.  Past Medical History:  Past Medical History:  Diagnosis Date  . Anxiety   . Arthritis   . Bipolar I disorder, most recent episode depressed ([place]) 11/23/2014  . Depression   . Hemangioma 04/28/2015  . HIV disease ([place])   . Hypertension   . Testicular pain 04/13/2016  . Tremor 09/07/2015  . 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 No pertinent surgical history.  Family Psychiatric History: Viewed.  Family History:  Family History  Problem Relation Age  of Onset  . Cancer Father        colon cancer  . Alcohol abuse Father   . Dementia Father   . Heart disease Maternal Uncle   . Alcohol abuse Maternal Uncle   . Depression Mother   . Physical abuse Mother   . Bipolar disorder Sister   . Schizophrenia Sister   . OCD Sister   . Alcohol abuse Brother   . Drug abuse Brother   . Seizures Brother   . Alcohol abuse Maternal Aunt   . Depression Maternal Aunt   . Alcohol abuse Paternal Uncle   . Depression Paternal Uncle     Social History:  Social History   Socioeconomic History  . Marital status: Single    Spouse name: None  . Number of children: None  . Years of education: None  . Highest education level: None  Social Needs  . Financial resource strain: None  . Food insecurity - worry: None  . Food insecurity - inability: None  . Transportation needs - medical: None  . Transportation needs - non-medical: None  Occupational History  . None  Tobacco Use  . Smoking status: Never Smoker  . Smokeless tobacco: Never Used  Substance and Sexual Activity  . Alcohol use: No    Alcohol/week: 0.0 oz  . Drug use: Yes    Frequency: 3.0 times per week    Types: Marijuana  . Sexual  activity: Not Currently    Partners: Male    Birth control/protection: Condom    Comment: declined condoms  Other Topics Concern  . None  Social History Narrative  . None    Allergies: No Known Allergies  Metabolic Disorder Labs: Recent Results (from the past 2160 hour(s))  Urine cytology ancillary only     Status: None   Collection Time: 02/15/17 12:00 AM  Result Value Ref Range   Chlamydia Negative     Comment: Normal Reference Range - Negative   Neisseria gonorrhea Negative     Comment: Normal Reference Range - Negative  T-helper cell (CD4)- ([facility] clinic only)     Status: Abnormal   Collection Time: 02/15/17 11:59 AM  Result Value Ref Range   CD4 T Cell Abs 890 400 - 2,700 /uL   CD4 % Helper T Cell 28 (L) 33 - 55 %    Comment: Performed  at [facility] [place] [address] [place], [place] [address]  HIV 1 RNA quant-no reflex-bld     Status: None   Collection Time: 02/15/17 12:18 PM  Result Value Ref Range   HIV 1 RNA Quant <20 NOT DETECTED NOT DETECT copies/mL   HIV-1 RNA Quant, Log <1.30 NOT DETECTED NOT DETECT Log copies/mL    Comment: . This test was performed using Real-Time Polymerase Chain Reaction. . Reportable Range: 20 copies/mL to 10,000,000 copies/mL (1.30 log copies/mL to 7.00 log copies/mL).   Lipid panel     Status: Abnormal   Collection Time: 02/15/17 12:20 PM  Result Value Ref Range   Cholesterol 219 (H) <200 mg/dL   HDL 40 (L) >40 mg/dL   Triglycerides 453 (H) <150 mg/dL   LDL Cholesterol (Calc)  mg/dL (calc)    Comment: . LDL cholesterol not calculated. Triglyceride levels greater than 400 mg/dL invalidate calculated LDL results. . Reference range: <100 . Desirable range <100 mg/dL for primary prevention;   <70 mg/dL for patients with CHD or diabetic patients  with > or = 2 CHD risk factors. [name] LDL-C is now calculated using the Martin-Hopkins  calculation, which is a validated novel method providing  better accuracy than the Friedewald equation in the  estimation of LDL-C.  Cresenciano Genre et al. Annamaria Helling. 6073;710(62): 2061-2068  (http://education.[employer].com/faq/FAQ164)    Total CHOL/HDL Ratio 5.5 (H) <5.0 (calc)   Non-HDL Cholesterol (Calc) 179 (H) <130 mg/dL (calc)    Comment: For patients with diabetes plus 1 major ASCVD risk  factor, treating to a non-HDL-C goal of <100 mg/dL  (LDL-C of <70 mg/dL) is considered a therapeutic  option.   RPR     Status: None   Collection Time: 02/15/17 12:20 PM  Result Value Ref Range   RPR Ser Ql NON-REACTIVE NON-REACTI  CBC with Differential/Platelet     Status: None   Collection Time: 02/15/17 12:20 PM  Result Value Ref Range   WBC 7.8 3.8 - 10.8 Thousand/uL   RBC 5.26 4.20 - 5.80 Million/uL   Hemoglobin 17.0 13.2 - 17.1  g/dL   HCT 47.9 38.5 - 50.0 %   MCV 91.1 80.0 - 100.0 fL   MCH 32.3 27.0 - 33.0 pg   MCHC 35.5 32.0 - 36.0 g/dL   RDW 13.1 11.0 - 15.0 %   Platelets 186 140 - 400 Thousand/uL   MPV 10.9 7.5 - 12.5 fL   Neutro Abs 4,111 1,500 - 7,800 cells/uL   Lymphs Abs 2,886 850 - 3,900 cells/uL   WBC mixed population 710  200 - 950 cells/uL   Eosinophils Absolute 62 15 - 500 cells/uL   Basophils Absolute 31 0 - 200 cells/uL   Neutrophils Relative % 52.7 %   Total Lymphocyte 37.0 %   Monocytes Relative 9.1 %   Eosinophils Relative 0.8 %   Basophils Relative 0.4 %  COMPLETE METABOLIC PANEL WITH GFR     Status: None   Collection Time: 02/15/17 12:20 PM  Result Value Ref Range   Glucose, Bld 94 65 - 99 mg/dL    Comment: .            Fasting reference interval .    BUN 14 7 - 25 mg/dL   Creat 1.26 0.70 - 1.33 mg/dL    Comment: For patients >60 years of age, the reference limit for Creatinine is approximately 13% higher for people identified as African-American. .    GFR, Est Non African American 62 > OR = 60 mL/min/1.9m2   GFR, Est African American 72 > OR = 60 mL/min/1.25m2   BUN/Creatinine Ratio NOT APPLICABLE 6 - 22 (calc)   Sodium 140 135 - 146 mmol/L   Potassium 4.0 3.5 - 5.3 mmol/L   Chloride 103 98 - 110 mmol/L   CO2 29 20 - 32 mmol/L   Calcium 9.2 8.6 - 10.3 mg/dL   Total Protein 6.8 6.1 - 8.1 g/dL   Albumin 4.5 3.6 - 5.1 g/dL   Globulin 2.3 1.9 - 3.7 g/dL (calc)   AG Ratio 2.0 1.0 - 2.5 (calc)   Total Bilirubin 0.6 0.2 - 1.2 mg/dL   Alkaline phosphatase (APISO) 78 40 - 115 U/L   AST 16 10 - 35 U/L   ALT 22 9 - 46 U/L   Lab Results  Component Value Date   HGBA1C 5.6 06/15/2016   MPG 111 04/21/2014   No results found for: PROLACTIN Lab Results  Component Value Date   CHOL 219 (H) 02/15/2017   TRIG 453 (H) 02/15/2017   HDL 40 (L) 02/15/2017   CHOLHDL 5.5 (H) 02/15/2017   VLDL 41 (H) 03/28/2016   LDLCALC 50 03/28/2016   LDLCALC 53 04/14/2015   Lab Results   Component Value Date   TSH 1.023 08/29/2010   TSH 0.731 12/19/2007    Therapeutic Level Labs: No results found for: LITHIUM Lab Results  Component Value Date   VALPROATE 27.5 (L) 08/14/2016   VALPROATE CANCELED 06/15/2016   No components found for:  CBMZ  Current Medications: Current Outpatient Medications  Medication Sig Dispense Refill  . amLODipine (NORVASC) 10 MG tablet TAKE 1 TABLET BY MOUTH EVERY DAY 90 tablet 3  . atorvastatin (LIPITOR) 20 MG tablet TAKE 1 TABLET(20 MG) BY MOUTH DAILY 90 tablet 3  . bictegravir-emtricitabine-tenofovir AF (BIKTARVY) 50-200-25 MG TABS tablet Take 1 tablet by mouth daily. 30 tablet 11  . buPROPion (WELLBUTRIN XL) 300 MG 24 hr tablet TAKE 1 TABLET(150 MG) BY MOUTH DAILY 30 tablet 0  . clonazePAM (KLONOPIN) 1 MG tablet Take 1 tablet (1 mg total) at bedtime by mouth. 30 tablet 2  . divalproex (DEPAKOTE ER) 500 MG 24 hr tablet Take 1 tablet (500 mg total) by mouth at bedtime. 30 tablet 0  . ibuprofen (ADVIL,MOTRIN) 800 MG tablet Take 1 tablet (800 mg total) by mouth every 8 (eight) hours as needed. 90 tablet 5  . metoprolol succinate (TOPROL-XL) 25 MG 24 hr tablet Take 12.5 mg by mouth 2 (two) times daily.    . ondansetron (ZOFRAN) 8 MG tablet Take 1  tablet (8 mg total) by mouth every 8 (eight) hours as needed for nausea or vomiting. 60 tablet 3  . fluoruracil (CARAC) 0.5 % cream Apply topically daily.     No current facility-administered medications for this visit.      Musculoskeletal: Strength & Muscle Tone: within normal limits Gait & Station: normal Patient leans: N/A  Psychiatric Specialty Exam: ROS  Blood pressure 122/74, pulse 65, height 5\' 9"  (1.753 m), weight 160 lb 3.2 oz (72.7 kg).Body mass index is 23.66 kg/m.  General Appearance: Well Groomed  Eye Contact:  Good  Speech:  Clear and Coherent  Volume:  Normal  Mood:  Euthymic  Affect:  Appropriate  Thought Process:  Goal Directed  Orientation:  Full (Time, Place, and  Person)  Thought Content: Logical   Suicidal Thoughts:  No  Homicidal Thoughts:  No  Memory:  Immediate;   Good Recent;   Good Remote;   Good  Judgement:  Good  Insight:  Good  Psychomotor Activity:  Normal  Concentration:  Concentration: Good and Attention Span: Good  Recall:  Good  Fund of Knowledge: Good  Language: Good  Akathisia:  No  Handed:  Right  AIMS (if indicated): not done  Assets:  Communication Skills Desire for Improvement Housing Resilience  ADL's:  Intact  Cognition: WNL  Sleep:  Good   Screenings: [place] from 02/15/2017 in [facility] Infectious [place] from 12/27/2016 in [facility] for Infectious Disease Office Visit from 11/29/2016 in [facility] for Infectious Disease Office Visit from 04/13/2016 in [facility] for Infectious Disease Office Visit from 04/28/2015 in [facility] for Infectious Disease  PHQ-2 Total Score  2  6  6  6  5   PHQ-9 Total Score  13  21  16  10  15        Assessment and Plan: Bipolar disorder type I.  Anxiety disorder NOS.  I reviewed blood work results and collect information from other providers.  Patient is stable on his current medication.  Continue Depakote 500 mg at bedtime, Klonopin 1 mg at bedtime and Wellbutrin XL 300 mg daily.  Encouraged to continue counseling with therapist.  Recommended to call us back if he has any question or any concern.  Follow-up in 3 months.   Kathlee Nations, MD 03/29/2017, 10:50 AM

## 2017-04-24 ENCOUNTER — Ambulatory Visit: Payer: Self-pay | Admitting: Licensed Clinical Social Worker

## 2017-06-27 ENCOUNTER — Ambulatory Visit (HOSPITAL_COMMUNITY): Payer: Medicare Other | Admitting: Psychiatry

## 2017-06-27 ENCOUNTER — Encounter (HOSPITAL_COMMUNITY): Payer: Self-pay | Admitting: Psychiatry

## 2017-06-27 VITALS — BP 110/64 | HR 54 | Ht 69.0 in | Wt 160.0 lb

## 2017-06-27 DIAGNOSIS — F1290 Cannabis use, unspecified, uncomplicated: Secondary | ICD-10-CM

## 2017-06-27 DIAGNOSIS — F319 Bipolar disorder, unspecified: Secondary | ICD-10-CM

## 2017-06-27 DIAGNOSIS — F411 Generalized anxiety disorder: Secondary | ICD-10-CM

## 2017-06-27 DIAGNOSIS — Z811 Family history of alcohol abuse and dependence: Secondary | ICD-10-CM

## 2017-06-27 DIAGNOSIS — Z818 Family history of other mental and behavioral disorders: Secondary | ICD-10-CM

## 2017-06-27 DIAGNOSIS — B20 Human immunodeficiency virus [HIV] disease: Secondary | ICD-10-CM | POA: Diagnosis not present

## 2017-06-27 DIAGNOSIS — Z813 Family history of other psychoactive substance abuse and dependence: Secondary | ICD-10-CM | POA: Diagnosis not present

## 2017-06-27 DIAGNOSIS — Z810 Family history of intellectual disabilities: Secondary | ICD-10-CM | POA: Diagnosis not present

## 2017-06-27 MED ORDER — CLONAZEPAM 1 MG PO TABS: 1.0000 mg | ORAL_TABLET | Freq: Every day | ORAL | 2 refills | Status: DC

## 2017-06-27 MED ORDER — DIVALPROEX SODIUM ER 500 MG PO TB24: 500.0000 mg | ORAL_TABLET | Freq: Every day | ORAL | 2 refills | Status: DC

## 2017-06-27 MED ORDER — BUPROPION HCL ER (XL) 300 MG PO TB24: ORAL_TABLET | ORAL | 2 refills | Status: DC

## 2017-06-27 NOTE — Progress Notes (Signed)
[facility] MD/PA/NP OP Progress Note  06/27/2017 10:49 AM Alexis Welch  MRN:  [id number]  Chief Complaint: I am doing good but sometimes I have no energy.  HPI: Alexis Welch came for his follow-up appointment.  He is compliant with Depakote Klonopin and Wellbutrin.  He mention his depression is stable and he denies any irritability, anger, mania or any psychosis.  However he noticed lack of energy and get easily tired.  He is not sure what causing it.  He is seeing his HIV physician and please that medicine working.  He is sleeping good.  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  He wanted to do yard work but he feel he has no energy.  Patient denies any crying spells, irritability, anger, mania or any suicidal thoughts.  His appetite is okay.  He denies drinking or using any illegal substances.  He is not seeing any therapist and he does not feel he needed at this time but promised to call us back if he feel that he need to go back on therapy  Visit Diagnosis:    ICD-10-CM   1. Bipolar 1 disorder (HCC) F31.9 divalproex (DEPAKOTE ER) 500 MG 24 hr tablet    clonazePAM (KLONOPIN) 1 MG tablet    buPROPion (WELLBUTRIN XL) 300 MG 24 hr tablet    Past Psychiatric History: Reviewed. Patient denies any history of psychiatric inpatient treatment or any suicidal attempt. He has long history of mental illness. In the past she had tried Celexa, Trileptal, Lamictal, Lexapro and Zyprexa. He had a good response with Lamictal but his insurance does not cover. He had diarrhea with the Lexapro.  Past Medical History:  Past Medical History:  Diagnosis Date  . Anxiety   . Arthritis   . Bipolar I disorder, most recent episode depressed ([place]) 11/23/2014  . Depression   . Hemangioma 04/28/2015  . HIV disease ([place])   . Hypertension   . Testicular pain 04/13/2016  . Tremor 09/07/2015  . 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  Family Psychiatric  History: Reviewed  Family History:  Family History  Problem Relation Age of Onset  . Cancer Father        colon cancer  . Alcohol abuse Father   . Dementia Father   . Heart disease Maternal Uncle   . Alcohol abuse Maternal Uncle   . Depression Mother   . Physical abuse Mother   . Bipolar disorder Sister   . Schizophrenia Sister   . OCD Sister   . Alcohol abuse Brother   . Drug abuse Brother   . Seizures Brother   . Alcohol abuse Maternal Aunt   . Depression Maternal Aunt   . Alcohol abuse Paternal Uncle   . Depression Paternal Uncle     Social History:  Social History   Socioeconomic History  . Marital status: Single    Spouse name: Not on file  . Number of children: Not on file  . Years of education: Not on file  . Highest education level: Not on file  Occupational History  . Not on file  Social Needs  . Financial resource strain: Not on file  . Food insecurity:    Worry: Not on file    Inability: Not on file  . Transportation needs:    Medical: Not on file    Non-medical: Not on file  Tobacco Use  . Smoking status: Never Smoker  .  Smokeless tobacco: Never Used  Substance and Sexual Activity  . Alcohol use: No    Alcohol/week: 0.0 oz  . Drug use: Yes    Frequency: 3.0 times per week    Types: Marijuana  . Sexual activity: Not Currently    Partners: Male    Birth control/protection: Condom    Comment: declined condoms  Lifestyle  . Physical activity:    Days per week: Not on file    Minutes per session: Not on file  . Stress: Not on file  Relationships  . Social connections:    Talks on phone: Not on file    Gets together: Not on file    Attends religious service: Not on file    Active member of club or organization: Not on file    Attends meetings of clubs or organizations: Not on file    Relationship status: Not on file  Other Topics Concern  . Not on file  Social History Narrative  . Not on file    Allergies: No Known  Allergies  Metabolic Disorder Labs: Lab Results  Component Value Date   HGBA1C 5.6 06/15/2016   MPG 111 04/21/2014   No results found for: PROLACTIN Lab Results  Component Value Date   CHOL 219 (H) 02/15/2017   TRIG 453 (H) 02/15/2017   HDL 40 (L) 02/15/2017   CHOLHDL 5.5 (H) 02/15/2017   VLDL 41 (H) 03/28/2016   [place]  02/15/2017     Comment:     . LDL cholesterol not calculated. Triglyceride levels greater than 400 mg/dL invalidate calculated LDL results. . Reference range: <100 . Desirable range <100 mg/dL for primary prevention;   <70 mg/dL for patients with CHD or diabetic patients  with > or = 2 CHD risk factors. [name] LDL-C is now calculated using the Martin-Hopkins  calculation, which is a validated novel method providing  better accuracy than the Friedewald equation in the  estimation of LDL-C.  Cresenciano Genre et al. Annamaria Helling. 3664;403(47): 2061-2068  (http://education.[employer].com/faq/FAQ164)    LDLCALC 50 03/28/2016   Lab Results  Component Value Date   TSH 1.023 08/29/2010   TSH 0.731 12/19/2007    Therapeutic Level Labs: No results found for: LITHIUM Lab Results  Component Value Date   VALPROATE 27.5 (L) 08/14/2016   VALPROATE CANCELED 06/15/2016   No components found for:  CBMZ  Current Medications: Current Outpatient Medications  Medication Sig Dispense Refill  . amLODipine (NORVASC) 10 MG tablet TAKE 1 TABLET BY MOUTH EVERY DAY 90 tablet 3  . atorvastatin (LIPITOR) 20 MG tablet TAKE 1 TABLET(20 MG) BY MOUTH DAILY 90 tablet 3  . bictegravir-emtricitabine-tenofovir AF (BIKTARVY) 50-200-25 MG TABS tablet Take 1 tablet by mouth daily. 30 tablet 11  . buPROPion (WELLBUTRIN XL) 300 MG 24 hr tablet TAKE 1 TABLET(150 MG) BY MOUTH DAILY 30 tablet 2  . clonazePAM (KLONOPIN) 1 MG tablet Take 1 tablet (1 mg total) by mouth at bedtime. 30 tablet 2  . divalproex (DEPAKOTE ER) 500 MG 24 hr tablet Take 1 tablet (500 mg total) by mouth at bedtime. 30 tablet 2   . fluoruracil (CARAC) 0.5 % cream Apply topically daily.    [name] ibuprofen (ADVIL,MOTRIN) 800 MG tablet Take 1 tablet (800 mg total) by mouth every 8 (eight) hours as needed. 90 tablet 5  . metoprolol succinate (TOPROL-XL) 25 MG 24 hr tablet Take 12.5 mg by mouth 2 (two) times daily.    . ondansetron (ZOFRAN) 8 MG tablet Take 1 tablet (8  mg total) by mouth every 8 (eight) hours as needed for nausea or vomiting. 60 tablet 3   No current facility-administered medications for this visit.      Musculoskeletal: Strength & Muscle Tone: within normal limits Gait & Station: normal Patient leans: N/A  Psychiatric Specialty Exam: Review of Systems  Constitutional:       Tired    Blood pressure 110/64, pulse (!) 54, height 5\' 9"  (1.753 m), weight 160 lb (72.6 kg), SpO2 96 %.Body mass index is 23.63 kg/m.  General Appearance: Casual  Eye Contact:  Good  Speech:  Clear and Coherent  Volume:  Normal  Mood:  Anxious  Affect:  Congruent  Thought Process:  Goal Directed  Orientation:  Full (Time, Place, and Person)  Thought Content: Logical   Suicidal Thoughts:  No  Homicidal Thoughts:  No  Memory:  Immediate;   Good Recent;   Good Remote;   Good  Judgement:  Good  Insight:  Good  Psychomotor Activity:  Normal  Concentration:  Concentration: Good and Attention Span: Good  Recall:  Good  Fund of Knowledge: Good  Language: Good  Akathisia:  No  Handed:  Right  AIMS (if indicated): not done  Assets:  Communication Skills Desire for Improvement Housing  ADL's:  Intact  Cognition: WNL  Sleep:  Good   Screenings: [place] from 02/15/2017 in [facility]ious [place] from 12/27/2016 in [facility] for Infectious Disease Office Visit from 11/29/2016 in [facility] for Infectious Disease Office Visit from 04/13/2016 in [facility] for Infectious Disease Office Visit  from 04/28/2015 in [facility] for Infectious Disease  PHQ-2 Total Score  2  6  6  6  5   PHQ-9 Total Score  13  21  16  10  15        Assessment and Plan: Bipolar disorder type I.  Anxiety disorder NOS.  Patient doing better on his current medication except chronic feeling of tired and lack of energy.  I recommended to have his physician test for thyroid, vitamin D and testosterone level.  Continue Depakote 500 mg at bedtime, Klonopin 1 mg at bedtime and Wellbutrin XL 300 mg daily.  He has no tremors shakes or any EPS.  Recommended to call us back if is any question or any concern.  Follow-up in 3 months.   Kathlee Nations, MD 06/27/2017, 10:49 AM

## 2017-07-02 ENCOUNTER — Other Ambulatory Visit: Payer: Self-pay | Admitting: Infectious Disease

## 2017-07-02 DIAGNOSIS — E785 Hyperlipidemia, unspecified: Secondary | ICD-10-CM

## 2017-07-09 ENCOUNTER — Other Ambulatory Visit: Payer: Self-pay | Admitting: Infectious Disease

## 2017-07-09 DIAGNOSIS — B20 Human immunodeficiency virus [HIV] disease: Secondary | ICD-10-CM

## 2017-07-29 ENCOUNTER — Other Ambulatory Visit: Payer: Self-pay | Admitting: Infectious Disease

## 2017-07-29 DIAGNOSIS — R110 Nausea: Secondary | ICD-10-CM

## 2017-08-15 ENCOUNTER — Other Ambulatory Visit (HOSPITAL_COMMUNITY)
Admission: RE | Admit: 2017-08-15 | Discharge: 2017-08-15 | Disposition: A | Discharge status: Home / Self Care | Payer: Medicare Other | Source: Ambulatory Visit | Attending: Infectious Disease | Admitting: Infectious Disease

## 2017-08-15 ENCOUNTER — Other Ambulatory Visit: Payer: Medicare Other

## 2017-08-15 DIAGNOSIS — Z21 Asymptomatic human immunodeficiency virus [HIV] infection status: Secondary | ICD-10-CM

## 2017-08-15 DIAGNOSIS — B20 Human immunodeficiency virus [HIV] disease: Secondary | ICD-10-CM | POA: Insufficient documentation

## 2017-08-16 LAB — URINE CYTOLOGY ANCILLARY ONLY
Chlamydia: NEGATIVE
Neisseria Gonorrhea: NEGATIVE

## 2017-08-16 LAB — MICROALBUMIN / CREATININE URINE RATIO
CREATININE, URINE: 164 mg/dL (ref 20–320)
MICROALB/CREAT RATIO: 4 ug/mg{creat} (ref <30)
Microalb, Ur: 0.6 mg/dL

## 2017-08-17 LAB — COMPLETE METABOLIC PANEL WITH GFR
AG Ratio: 1.8 (calc) (ref 1.0–2.5)
ALBUMIN MSPROF: 4.3 g/dL (ref 3.6–5.1)
ALKALINE PHOSPHATASE (APISO): 67 U/L (ref 40–115)
ALT: 14 U/L (ref 9–46)
AST: 15 U/L (ref 10–35)
BILIRUBIN TOTAL: 1.1 mg/dL (ref 0.2–1.2)
BUN: 11 mg/dL (ref 7–25)
CHLORIDE: 104 mmol/L (ref 98–110)
CO2: 28 mmol/L (ref 20–32)
Calcium: 9.2 mg/dL (ref 8.6–10.3)
Creat: 1.18 mg/dL (ref 0.70–1.33)
GFR, Est African American: 78 mL/min/{1.73_m2} (ref >=60)
GFR, Est Non African American: 68 mL/min/{1.73_m2} (ref >=60)
GLOBULIN: 2.4 g/dL (ref 1.9–3.7)
Glucose, Bld: 86 mg/dL (ref 65–99)
Potassium: 5.2 mmol/L (ref 3.5–5.3)
SODIUM: 141 mmol/L (ref 135–146)
Total Protein: 6.7 g/dL (ref 6.1–8.1)

## 2017-08-17 LAB — LIPID PANEL
CHOLESTEROL: 139 mg/dL (ref <200)
HDL: 51 mg/dL (ref >40)
LDL CHOLESTEROL (CALC): 68 mg/dL
Non-HDL Cholesterol (Calc): 88 mg/dL (calc) (ref <130)
Total CHOL/HDL Ratio: 2.7 (calc) (ref <5.0)
Triglycerides: 116 mg/dL (ref <150)

## 2017-08-17 LAB — CBC WITH DIFFERENTIAL/PLATELET
Basophils Absolute: 30 cells/uL (ref 0–200)
Basophils Relative: 0.4 %
EOS PCT: 0.9 %
Eosinophils Absolute: 68 cells/uL (ref 15–500)
HCT: 45 % (ref 38.5–50.0)
Hemoglobin: 15.8 g/dL (ref 13.2–17.1)
Lymphs Abs: 3519 cells/uL (ref 850–3900)
MCH: 32.7 pg (ref 27.0–33.0)
MCHC: 35.1 g/dL (ref 32.0–36.0)
MCV: 93.2 fL (ref 80.0–100.0)
MPV: 11.3 fL (ref 7.5–12.5)
Monocytes Relative: 8.3 %
NEUTROS PCT: 44.1 %
Neutro Abs: 3352 cells/uL (ref 1500–7800)
Platelets: 175 10*3/uL (ref 140–400)
RBC: 4.83 10*6/uL (ref 4.20–5.80)
RDW: 13 % (ref 11.0–15.0)
Total Lymphocyte: 46.3 %
WBC mixed population: 631 cells/uL (ref 200–950)
WBC: 7.6 10*3/uL (ref 3.8–10.8)

## 2017-08-17 LAB — T-HELPER CELL (CD4) - (RCID CLINIC ONLY)
CD4 % Helper T Cell: 29 % — ABNORMAL LOW (ref 33–55)
CD4 T Cell Abs: 1020 /uL (ref 400–2700)

## 2017-08-17 LAB — RPR: RPR Ser Ql: NONREACTIVE

## 2017-08-20 ENCOUNTER — Other Ambulatory Visit: Payer: Self-pay | Admitting: Infectious Disease

## 2017-08-20 DIAGNOSIS — R110 Nausea: Secondary | ICD-10-CM

## 2017-08-20 LAB — HIV-1 RNA QUANT-NO REFLEX-BLD
HIV 1 RNA Quant: <20 copies/mL — AB
HIV-1 RNA Quant, Log: <1.3 Log copies/mL — AB

## 2017-08-24 ENCOUNTER — Other Ambulatory Visit: Payer: Self-pay | Admitting: Infectious Disease

## 2017-08-24 DIAGNOSIS — R110 Nausea: Secondary | ICD-10-CM

## 2017-08-29 ENCOUNTER — Ambulatory Visit: Payer: Medicare Other | Admitting: Infectious Disease

## 2017-08-29 ENCOUNTER — Encounter: Payer: Self-pay | Admitting: Infectious Disease

## 2017-08-29 VITALS — BP 132/79 | HR 51 | Temp 98.2°F | Ht 69.0 in | Wt 156.0 lb

## 2017-08-29 DIAGNOSIS — I10 Essential (primary) hypertension: Secondary | ICD-10-CM | POA: Diagnosis not present

## 2017-08-29 DIAGNOSIS — R5383 Other fatigue: Secondary | ICD-10-CM

## 2017-08-29 DIAGNOSIS — Z23 Encounter for immunization: Secondary | ICD-10-CM | POA: Diagnosis not present

## 2017-08-29 DIAGNOSIS — B20 Human immunodeficiency virus [HIV] disease: Secondary | ICD-10-CM

## 2017-08-29 DIAGNOSIS — F331 Major depressive disorder, recurrent, moderate: Secondary | ICD-10-CM

## 2017-08-29 DIAGNOSIS — E782 Mixed hyperlipidemia: Secondary | ICD-10-CM | POA: Diagnosis not present

## 2017-08-29 DIAGNOSIS — R002 Palpitations: Secondary | ICD-10-CM

## 2017-08-29 NOTE — Progress Notes (Signed)
Chief complaint: Alexis Welch is complaining of chronic nausea but he claims he has had since he has been a child but also wondering if it is related to [facility]   Subjective:    Patient ID: Alexis Welch, male    DOB: 07/31/1960, 57 y.o.   MRN: [id number]  HPI  [name] is a 57 y.o. male who is doing superbly well on BIKTARVY. He is under the care of [name] for his psychiatric conditions.   Today he is wondering about why he has chronic nausea.  He is wondering if it could do be due to [facility].  The suggestion that it could be due to [facility] came to him when he was watching a commercial on TV for BIKTARVY and nausea was listed as a possible side effect.  On further exploring what symptoms he has been having it seems that he has been having chronic nausea for his entire life.  I explained to him that the majority of the patient's who were on BIKTARVY in clinical trials and most the patient's I have seen in my clinical practice do not suffer the nausea related to this medication.  Also explained that every single side effect that any patient experienced on a clinical trial the study needed to be listed and that is why they were being listed also on the product I&D on these commercials.  Regardless I am not completely close minded about the possibility could be experiencing nausea on BIKTARVY and we did discuss options such as going to [place].  Past Medical History:  Diagnosis Date  . Anxiety   . Arthritis   . Bipolar I disorder, most recent episode depressed ([place]) 11/23/2014  . Depression   . Hemangioma 04/28/2015  . HIV disease ([place])   . Hypertension   . Testicular pain 04/13/2016  . Tremor 09/07/2015  . 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  Family History  Problem Relation Age of Onset  . Cancer Father        colon cancer  . Alcohol abuse Father   . Dementia Father   . Heart disease Maternal Uncle   . Alcohol abuse Maternal  Uncle   . Depression Mother   . Physical abuse Mother   . Bipolar disorder Sister   . Schizophrenia Sister   . OCD Sister   . Alcohol abuse Brother   . Drug abuse Brother   . Seizures Brother   . Alcohol abuse Maternal Aunt   . Depression Maternal Aunt   . Alcohol abuse Paternal Uncle   . Depression Paternal Uncle       Social History   Socioeconomic History  . Marital status: Single    Spouse name: Not on file  . Number of children: Not on file  . Years of education: Not on file  . Highest education level: Not on file  Occupational History  . Not on file  Social Needs  . Financial resource strain: Not on file  . Food insecurity:    Worry: Not on file    Inability: Not on file  . Transportation needs:    Medical: Not on file    Non-medical: Not on file  Tobacco Use  . Smoking status: Never Smoker  . Smokeless tobacco: Never Used  Substance and Sexual Activity  . Alcohol use: No    Alcohol/week: 0.0 oz  . Drug use: Yes    Frequency: 3.0 times per week  Types: Marijuana  . Sexual activity: Not Currently    Partners: Male    Birth control/protection: Condom    Comment: declined condoms  Lifestyle  . Physical activity:    Days per week: Not on file    Minutes per session: Not on file  . Stress: Not on file  Relationships  . Social connections:    Talks on phone: Not on file    Gets together: Not on file    Attends religious service: Not on file    Active member of club or organization: Not on file    Attends meetings of clubs or organizations: Not on file    Relationship status: Not on file  Other Topics Concern  . Not on file  Social History Narrative  . Not on file    No Known Allergies   Current Outpatient Medications:  .  amLODipine (NORVASC) 10 MG tablet, TAKE 1 TABLET BY MOUTH EVERY DAY, Disp: 90 tablet, Rfl: 3 .  atorvastatin (LIPITOR) 20 MG tablet, TAKE 1 TABLET BY MOUTH DAILY FOR 90 DAYS, Disp: 90 tablet, Rfl: 0 .  BIKTARVY 50-200-25 MG  TABS tablet, TAKE 1 TABLET BY MOUTH DAILY, Disp: 30 tablet, Rfl: 5 .  buPROPion (WELLBUTRIN XL) 300 MG 24 hr tablet, TAKE 1 TABLET(150 MG) BY MOUTH DAILY, Disp: 30 tablet, Rfl: 2 .  clonazePAM (KLONOPIN) 1 MG tablet, Take 1 tablet (1 mg total) by mouth at bedtime., Disp: 30 tablet, Rfl: 2 .  divalproex (DEPAKOTE ER) 500 MG 24 hr tablet, Take 1 tablet (500 mg total) by mouth at bedtime., Disp: 30 tablet, Rfl: 2 .  ibuprofen (ADVIL,MOTRIN) 800 MG tablet, Take 1 tablet (800 mg total) by mouth every 8 (eight) hours as needed., Disp: 90 tablet, Rfl: 5 .  metoprolol succinate (TOPROL-XL) 25 MG 24 hr tablet, Take 12.5 mg by mouth 2 (two) times daily., Disp: , Rfl:  .  ondansetron (ZOFRAN) 4 MG tablet, TAKE 1 TABLET(4 MG) BY MOUTH THREE TIMES DAILY AS NEEDED FOR NAUSEA, Disp: 60 tablet, Rfl: 1 .  fluoruracil (CARAC) 0.5 % cream, Apply topically daily., Disp: , Rfl:  .  ondansetron (ZOFRAN) 8 MG tablet, TAKE 1 TABLET(8 MG) BY MOUTH EVERY 8 HOURS AS NEEDED FOR NAUSEA OR VOMITING (Patient not taking: Reported on 08/29/2017), Disp: 30 tablet, Rfl: 0   .Review of Systems  Constitutional: Negative for activity change, appetite change, chills, diaphoresis, fatigue, fever and unexpected weight change.  HENT: Negative for congestion, nosebleeds, postnasal drip, rhinorrhea, sinus pressure, sneezing, sore throat and trouble swallowing.   Eyes: Negative for photophobia and visual disturbance.  Respiratory: Negative for cough, chest tightness, shortness of breath, wheezing and stridor.   Cardiovascular: Negative for chest pain, palpitations and leg swelling.  Gastrointestinal: Positive for nausea. Negative for abdominal distention, abdominal pain, anal bleeding, blood in stool, constipation, diarrhea and vomiting.  Genitourinary: Negative for difficulty urinating, dysuria, flank pain and hematuria.  Musculoskeletal: Negative for back pain, gait problem, joint swelling and myalgias.  Skin: Negative for color change,  pallor and wound.  Neurological: Positive for tremors. Negative for dizziness, weakness and light-headedness.  Hematological: Negative for adenopathy. Does not bruise/bleed easily.  Psychiatric/Behavioral: Negative for agitation, confusion, decreased concentration and sleep disturbance. The patient is nervous/anxious.        Objective:   Physical Exam  Constitutional: He is oriented to person, place, and time. He appears well-developed and well-nourished. No distress.  HENT:  Head: Normocephalic and atraumatic.  Mouth/Throat: Oropharynx is clear and moist.  No oropharyngeal exudate.  Eyes: Pupils are equal, round, and reactive to light. Conjunctivae and EOM are normal. No scleral icterus.  Neck: Normal range of motion. Neck supple. No JVD present.  Cardiovascular: Normal rate and regular rhythm.  Pulmonary/Chest: Effort normal and breath sounds normal. No respiratory distress.  Abdominal: He exhibits no distension.  Musculoskeletal: He exhibits no edema or tenderness.  Lymphadenopathy:    He has no cervical adenopathy.  Neurological: He is alert and oriented to person, place, and time. He displays tremor. He exhibits normal muscle tone. Coordination normal.  Skin: Skin is warm and dry. He is not diaphoretic. No erythema. No pallor.  Psychiatric: His behavior is normal. Judgment and thought content normal. His mood appears anxious.  Nursing note and vitals reviewed.         Assessment & Plan:    PFX:[id number] with BIKTARVY, Renew HMAP consider DOVATO in future if he truly finds correlation with the meds and his nausea  Lab Results  Component Value Date   HIV1RNAQUANT <20 DETECTED (A) 08/15/2017   HIV1RNAQUANT <20 NOT DETECTED 02/15/2017   HIV1RNAQUANT <20 NOT DETECTED 11/29/2016    Lab Results  Component Value Date   CD4TABS 1,020 08/15/2017   CD4TABS 890 02/15/2017   CD4TABS 870 11/29/2016     CV risk: continue lipitor 20mg ,   Hyperlipidemia: see above  Bipolar,  Depression: continue to see  [name] and [facility] counselors.  HTN: continue  norvasc.  There were no vitals filed for this visit. PTSD: seeing [name]  Nausea: He has prescription for Zofran he can take this I do not think this nausea is related to his BIKTARVY.  I spent greater than 25 minutes with the patient including greater than 50% of time in face to face counsel of the patient regarding his HIV reviewing all of his laboratory data with him, explaining the data some clinical trials with Biktarvy most likely side effects that would be likely to be attributed to this medication reviewing his history of nausea on this regimen prior regimens and prior to being on antiretroviral regimens and in coordination of his care.

## 2017-08-30 LAB — TSH: TSH: 1.18 mIU/L (ref 0.40–4.50)

## 2017-09-04 ENCOUNTER — Encounter: Payer: Self-pay | Admitting: Infectious Disease

## 2017-09-11 NOTE — Addendum Note (Signed)
Addended by: Landis Gandy on: 09/11/2017 12:48 PM   Modules accepted: Orders

## 2017-09-22 ENCOUNTER — Other Ambulatory Visit (HOSPITAL_COMMUNITY): Payer: Self-pay | Admitting: Psychiatry

## 2017-09-22 DIAGNOSIS — F319 Bipolar disorder, unspecified: Secondary | ICD-10-CM

## 2017-09-26 NOTE — Telephone Encounter (Signed)
Medication refill - one time refill of patient's prescribed Depakote ER 500 mg, #30 approved by Dr.Kumar and e-scribed to patient's [facility] pharmacy this date as ordered.

## 2017-09-27 ENCOUNTER — Ambulatory Visit (HOSPITAL_COMMUNITY): Payer: Self-pay | Admitting: Psychiatry

## 2017-09-28 ENCOUNTER — Other Ambulatory Visit: Payer: Self-pay | Admitting: Infectious Disease

## 2017-09-28 DIAGNOSIS — E785 Hyperlipidemia, unspecified: Secondary | ICD-10-CM

## 2017-10-18 ENCOUNTER — Other Ambulatory Visit (HOSPITAL_COMMUNITY): Payer: Self-pay | Admitting: Psychiatry

## 2017-10-18 DIAGNOSIS — F319 Bipolar disorder, unspecified: Secondary | ICD-10-CM

## 2017-10-22 ENCOUNTER — Other Ambulatory Visit (HOSPITAL_COMMUNITY): Payer: Self-pay | Admitting: Psychiatry

## 2017-10-22 ENCOUNTER — Telehealth: Payer: Self-pay | Admitting: Behavioral Health

## 2017-10-22 DIAGNOSIS — F319 Bipolar disorder, unspecified: Secondary | ICD-10-CM

## 2017-10-22 NOTE — Telephone Encounter (Signed)
Patient has been taking 800mg  Motrin for multiple years prn for pain in feet.  Prescription is no longer active. Pricilla Riffle RN

## 2017-10-22 NOTE — Telephone Encounter (Signed)
There is NO need for a prescription for ibuprofen when pt can buy 200mg  ibuprofen OTC and take 4 tablets = 800mg  three times a day. Has script been helping him pay for these meds?

## 2017-10-23 NOTE — Telephone Encounter (Signed)
Thanks Ashley

## 2017-10-23 NOTE — Telephone Encounter (Signed)
Called patient and inquired if he was able to pay for Ibuprofen.  He states he is and advised him to buy OTC ibuprofen and let him know he does not need a rx for it.  Patient verbalized understanding. Pricilla Riffle RN

## 2017-10-29 ENCOUNTER — Other Ambulatory Visit (HOSPITAL_COMMUNITY): Payer: Self-pay

## 2017-10-29 DIAGNOSIS — F411 Generalized anxiety disorder: Secondary | ICD-10-CM

## 2017-10-29 MED ORDER — CLONAZEPAM 1 MG PO TABS: 1.0000 mg | ORAL_TABLET | Freq: Every day | ORAL | 0 refills | Status: DC

## 2017-11-06 ENCOUNTER — Telehealth: Payer: Self-pay

## 2017-11-06 DIAGNOSIS — I10 Essential (primary) hypertension: Secondary | ICD-10-CM

## 2017-11-06 MED ORDER — AMLODIPINE BESYLATE 10 MG PO TABS: 10.0000 mg | ORAL_TABLET | Freq: Every day | ORAL | 2 refills | Status: DC

## 2017-11-06 NOTE — Telephone Encounter (Signed)
Norvasc refill request

## 2017-11-13 ENCOUNTER — Encounter (HOSPITAL_COMMUNITY): Payer: Self-pay | Admitting: Psychiatry

## 2017-11-13 ENCOUNTER — Ambulatory Visit (INDEPENDENT_AMBULATORY_CARE_PROVIDER_SITE_OTHER): Payer: Medicare Other | Admitting: Psychiatry

## 2017-11-13 DIAGNOSIS — F319 Bipolar disorder, unspecified: Secondary | ICD-10-CM

## 2017-11-13 DIAGNOSIS — F411 Generalized anxiety disorder: Secondary | ICD-10-CM | POA: Diagnosis not present

## 2017-11-13 DIAGNOSIS — F1290 Cannabis use, unspecified, uncomplicated: Secondary | ICD-10-CM | POA: Diagnosis not present

## 2017-11-13 MED ORDER — DIVALPROEX SODIUM ER 500 MG PO TB24: ORAL_TABLET | ORAL | 0 refills | Status: DC

## 2017-11-13 MED ORDER — CLONAZEPAM 1 MG PO TABS: 1.0000 mg | ORAL_TABLET | Freq: Every day | ORAL | 0 refills | Status: DC

## 2017-11-13 MED ORDER — BUPROPION HCL ER (XL) 300 MG PO TB24: ORAL_TABLET | ORAL | 0 refills | Status: DC

## 2017-11-13 NOTE — Progress Notes (Signed)
[place] MD/PA/NP OP Progress Note  11/13/2017 2:41 PM Alexis Welch  MRN:  [id number]  Chief Complaint: I am doing fine but my blood pressure has been low.  HPI: Alexis Welch came for his follow-up appointment.  He is taking his medication as prescribed.  Lately he has noticed incidence of low blood pressure.  Last week his blood pressure was 80/50 and he called his physician who recommended to cut down his metoprolol.  Today his blood pressure is slightly decreased but he denies any chest pain, dizziness or any shortness of breath.  Overall he describes his mood is good.  He is more active but tends to get tired at the end of the day.  He denies any mania, psychosis, irritability, suicidal thoughts or homicidal thought.  His appetite is okay.  He denies drinking or using any illegal substances.  He does not feel that he need to see therapist at this time.  Visit Diagnosis:    ICD-10-CM   1. Bipolar 1 disorder (HCC) F31.9 buPROPion (WELLBUTRIN XL) 300 MG 24 hr tablet    divalproex (DEPAKOTE ER) 500 MG 24 hr tablet  2. GAD (generalized anxiety disorder) F41.1 clonazePAM (KLONOPIN) 1 MG tablet    Past Psychiatric History: Reviewed Patient denies any history of psychiatric inpatient treatment or any suicidal attempt. He has long history of mental illness. In the past she had tried Celexa, Trileptal, Lamictal, Lexapro and Zyprexa. He had a good response with Lamictal but his insurance does not cover. He had diarrhea with the Lexapro.  Past Medical History:  Past Medical History:  Diagnosis Date  . Anxiety   . Arthritis   . Bipolar I disorder, most recent episode depressed ([place]) 11/23/2014  . Depression   . Hemangioma 04/28/2015  . HIV disease ([place])   . Hypertension   . Testicular pain 04/13/2016  . Tremor 09/07/2015  . 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  Family Psychiatric History: Reviewed  Family History:  Family History  Problem Relation Age of  Onset  . Cancer Father        colon cancer  . Alcohol abuse Father   . Dementia Father   . Heart disease Maternal Uncle   . Alcohol abuse Maternal Uncle   . Depression Mother   . Physical abuse Mother   . Bipolar disorder Sister   . Schizophrenia Sister   . OCD Sister   . Alcohol abuse Brother   . Drug abuse Brother   . Seizures Brother   . Alcohol abuse Maternal Aunt   . Depression Maternal Aunt   . Alcohol abuse Paternal Uncle   . Depression Paternal Uncle     Social History:  Social History   Socioeconomic History  . Marital status: Single    Spouse name: Not on file  . Number of children: Not on file  . Years of education: Not on file  . Highest education level: Not on file  Occupational History  . Not on file  Social Needs  . Financial resource strain: Not on file  . Food insecurity:    Worry: Not on file    Inability: Not on file  . Transportation needs:    Medical: Not on file    Non-medical: Not on file  Tobacco Use  . Smoking status: Never Smoker  . Smokeless tobacco: Never Used  Substance and Sexual Activity  . Alcohol use: No    Alcohol/week: 0.0 standard drinks  . Drug  use: Yes    Frequency: 3.0 times per week    Types: Marijuana  . Sexual activity: Not Currently    Partners: Male    Birth control/protection: Condom    Comment: declined condoms  Lifestyle  . Physical activity:    Days per week: Not on file    Minutes per session: Not on file  . Stress: Not on file  Relationships  . Social connections:    Talks on phone: Not on file    Gets together: Not on file    Attends religious service: Not on file    Active member of club or organization: Not on file    Attends meetings of clubs or organizations: Not on file    Relationship status: Not on file  Other Topics Concern  . Not on file  Social History Narrative  . Not on file    Allergies: No Known Allergies  Metabolic Disorder Labs: Lab Results  Component Value Date   HGBA1C 5.6  06/15/2016   MPG 111 04/21/2014   No results found for: PROLACTIN Lab Results  Component Value Date   CHOL 139 08/15/2017   TRIG 116 08/15/2017   HDL 51 08/15/2017   CHOLHDL 2.7 08/15/2017   VLDL 41 (H) 03/28/2016   LDLCALC 68 08/15/2017   [place]  02/15/2017     Comment:     . LDL cholesterol not calculated. Triglyceride levels greater than 400 mg/dL invalidate calculated LDL results. . Reference range: <100 . Desirable range <100 mg/dL for primary prevention;   <70 mg/dL for patients with CHD or diabetic patients  with > or = 2 CHD risk factors. [name] LDL-C is now calculated using the Martin-Hopkins  calculation, which is a validated novel method providing  better accuracy than the Friedewald equation in the  estimation of LDL-C.  Cresenciano Genre et al. Annamaria Helling. 2263;335(45): 2061-2068  (http://education.[employer].com/faq/FAQ164)    Lab Results  Component Value Date   TSH 1.18 08/29/2017   TSH 1.023 08/29/2010    Therapeutic Level Labs: No results found for: LITHIUM Lab Results  Component Value Date   VALPROATE 27.5 (L) 08/14/2016   VALPROATE CANCELED 06/15/2016   No components found for:  CBMZ  Current Medications: Current Outpatient Medications  Medication Sig Dispense Refill  . amLODipine (NORVASC) 10 MG tablet Take 1 tablet (10 mg total) by mouth daily. 90 tablet 2  . atorvastatin (LIPITOR) 20 MG tablet TAKE 1 TABLET BY MOUTH DAILY 90 tablet 1  . BIKTARVY 50-200-25 MG TABS tablet TAKE 1 TABLET BY MOUTH DAILY 30 tablet 5  . buPROPion (WELLBUTRIN XL) 300 MG 24 hr tablet TAKE 1 TABLET BY MOUTH DAILY 30 tablet 0  . clonazePAM (KLONOPIN) 1 MG tablet Take 1 tablet (1 mg total) by mouth at bedtime. 30 tablet 0  . divalproex (DEPAKOTE ER) 500 MG 24 hr tablet TAKE 1 TABLET(500 MG) BY MOUTH AT BEDTIME 30 tablet 0  . metoprolol succinate (TOPROL-XL) 25 MG 24 hr tablet Take 12.5 mg by mouth 2 (two) times daily.    . ondansetron (ZOFRAN) 4 MG tablet TAKE 1 TABLET(4 MG) BY  MOUTH THREE TIMES DAILY AS NEEDED FOR NAUSEA 60 tablet 1  . fluoruracil (CARAC) 0.5 % cream Apply topically daily.    [name] ibuprofen (ADVIL,MOTRIN) 800 MG tablet Take 1 tablet (800 mg total) by mouth every 8 (eight) hours as needed. (Patient not taking: Reported on 11/13/2017) 90 tablet 5  . ondansetron (ZOFRAN) 8 MG tablet TAKE 1 TABLET(8 MG) BY MOUTH  EVERY 8 HOURS AS NEEDED FOR NAUSEA OR VOMITING (Patient not taking: Reported on 08/29/2017) 30 tablet 0   No current facility-administered medications for this visit.      Musculoskeletal: Strength & Muscle Tone: within normal limits Gait & Station: normal Patient leans: N/A  Psychiatric Specialty Exam: ROS  Blood pressure (!) 100/58, pulse 60, height 5' 6.5" (1.689 m), weight 153 lb (69.4 kg), SpO2 97 %.Body mass index is 24.32 kg/m.  General Appearance: Casual  Eye Contact:  Good  Speech:  Clear and Coherent  Volume:  Normal  Mood:  Euthymic  Affect:  Appropriate  Thought Process:  Goal Directed  Orientation:  Full (Time, Place, and Person)  Thought Content: Logical   Suicidal Thoughts:  No  Homicidal Thoughts:  No  Memory:  Immediate;   Good Recent;   Good Remote;   Good  Judgement:  Good  Insight:  Good  Psychomotor Activity:  Normal  Concentration:  Concentration: Good and Attention Span: Good  Recall:  Good  Fund of Knowledge: Good  Language: Good  Akathisia:  No  Handed:  Right  AIMS (if indicated): not done  Assets:  Communication Skills Desire for Improvement Housing Resilience  ADL's:  Intact  Cognition: WNL  Sleep:  Good   Screenings: [place] from 02/15/2017 in [facility] for Infectious [place] from 12/27/2016 in [facility] for Infectious Disease Office Visit from 11/29/2016 in [facility] for [facility]ious Disease Office Visit from 04/13/2016 in [facility] Disease Office Visit  from 04/28/2015 in [facility] for Infectious Disease  PHQ-2 Total Score  2  6  6  6  5   PHQ-9 Total Score  13  21  16  10  15        Assessment and Plan: Bipolar disorder type I.  Generalized anxiety disorder.  Discussed his vitals.  Recommended to call his physician if he has symptoms of hypotension including dizziness, chest pain.  Patient does not want to change his medication.  I will continue Depakote 500 mg at bedtime, Klonopin 1 mg at bedtime and Wellbutrin XL 300 mg daily.  He has no tremors, shakes or any EPS.  Recommended to call us back if is any question or any concern.  Follow-up in 3 months.   Kathlee Nations, MD 11/13/2017, 2:41 PM

## 2017-12-26 ENCOUNTER — Other Ambulatory Visit: Payer: Self-pay | Admitting: Infectious Disease

## 2017-12-26 DIAGNOSIS — B20 Human immunodeficiency virus [HIV] disease: Secondary | ICD-10-CM

## 2018-02-14 ENCOUNTER — Ambulatory Visit (HOSPITAL_COMMUNITY): Payer: Medicare Other | Admitting: Psychiatry

## 2018-02-14 ENCOUNTER — Encounter (HOSPITAL_COMMUNITY): Payer: Self-pay | Admitting: Psychiatry

## 2018-02-14 VITALS — BP 116/75 | HR 64 | Ht 66.5 in | Wt 157.0 lb

## 2018-02-14 DIAGNOSIS — F319 Bipolar disorder, unspecified: Secondary | ICD-10-CM

## 2018-02-14 DIAGNOSIS — F411 Generalized anxiety disorder: Secondary | ICD-10-CM | POA: Diagnosis not present

## 2018-02-14 MED ORDER — DIVALPROEX SODIUM ER 250 MG PO TB24: ORAL_TABLET | ORAL | 0 refills | Status: DC

## 2018-02-14 MED ORDER — LAMOTRIGINE 25 MG PO TABS: ORAL_TABLET | ORAL | 1 refills | Status: DC

## 2018-02-14 MED ORDER — BUPROPION HCL ER (XL) 300 MG PO TB24: ORAL_TABLET | ORAL | 0 refills | Status: DC

## 2018-02-14 MED ORDER — CLONAZEPAM 1 MG PO TABS: 1.0000 mg | ORAL_TABLET | Freq: Every day | ORAL | 0 refills | Status: DC

## 2018-02-14 NOTE — Progress Notes (Signed)
[place] MD/PA/NP OP Progress Note  02/14/2018 11:09 AM Alexis Welch  MRN:  [id number]  Chief Complaint: I am doing fine but I am not happy with my chronic diarrhea.  HPI: [place] came for his follow-up appointment.  He is taking his medication.  Overall he describes his mood is a stable but lately he noticed more frustration, racing thoughts and thinking about his health issues.  He has chronic diarrhea which he suffers from a long time.  He saw his physician but never had any endoscopy.  He mentioned he had some blood test but it came out normal.  His blood pressure is much better.  He endorsed sometimes having racing thoughts at night but denies any feeling of hopelessness, worthlessness or any suicidal thoughts.  He endorses energy is most of the time low and he has no motivation to do things.  He gets easily tired.  He denies any hallucination or any paranoia.  He is not drinking or using any illegal substances.  He does not feel he need to see a therapist.  He is taking Depakote, Wellbutrin and Klonopin.  Visit Diagnosis:    ICD-10-CM   1. GAD (generalized anxiety disorder) F41.1 lamoTRIgine (LAMICTAL) 25 MG tablet    buPROPion (WELLBUTRIN XL) 300 MG 24 hr tablet    clonazePAM (KLONOPIN) 1 MG tablet  2. Bipolar 1 disorder (HCC) F31.9 divalproex (DEPAKOTE ER) 250 MG 24 hr tablet    lamoTRIgine (LAMICTAL) 25 MG tablet    buPROPion (WELLBUTRIN XL) 300 MG 24 hr tablet    Past Psychiatric History: Reviewed. History of psychiatric inpatient treatment or any suicidal attempt.  Tried Celexa, Trileptal, Lamictal, Lexapro and Zyprexa.  Had a good response with Lamictal but insurance did not cover.  Had diarrhea with Lexapro.    Past Medical History:  Past Medical History:  Diagnosis Date  . Anxiety   . Arthritis   . Bipolar I disorder, most recent episode depressed ([place]) 11/23/2014  . Depression   . Hemangioma 04/28/2015  . HIV disease ([place])   . Hypertension   . Testicular pain 04/13/2016  .  Tremor 09/07/2015  . 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  Family Psychiatric History: Reviewed.  Family History:  Family History  Problem Relation Age of Onset  . Cancer Father        colon cancer  . Alcohol abuse Father   . Dementia Father   . Heart disease Maternal Uncle   . Alcohol abuse Maternal Uncle   . Depression Mother   . Physical abuse Mother   . Bipolar disorder Sister   . Schizophrenia Sister   . OCD Sister   . Alcohol abuse Brother   . Drug abuse Brother   . Seizures Brother   . Alcohol abuse Maternal Aunt   . Depression Maternal Aunt   . Alcohol abuse Paternal Uncle   . Depression Paternal Uncle     Social History:  Social History   Socioeconomic History  . Marital status: Single    Spouse name: Not on file  . Number of children: Not on file  . Years of education: Not on file  . Highest education level: Not on file  Occupational History  . Not on file  Social Needs  . Financial resource strain: Not on file  . Food insecurity:    Worry: Not on file    Inability: Not on file  . Transportation needs:    Medical: Not  on file    Non-medical: Not on file  Tobacco Use  . Smoking status: Never Smoker  . Smokeless tobacco: Never Used  Substance and Sexual Activity  . Alcohol use: No    Alcohol/week: 0.0 standard drinks  . Drug use: Yes    Frequency: 3.0 times per week    Types: Marijuana  . Sexual activity: Not Currently    Partners: Male    Birth control/protection: Condom    Comment: declined condoms  Lifestyle  . Physical activity:    Days per week: Not on file    Minutes per session: Not on file  . Stress: Not on file  Relationships  . Social connections:    Talks on phone: Not on file    Gets together: Not on file    Attends religious service: Not on file    Active member of club or organization: Not on file    Attends meetings of clubs or organizations: Not on file    Relationship status: Not on  file  Other Topics Concern  . Not on file  Social History Narrative  . Not on file    Allergies: No Known Allergies  Metabolic Disorder Labs: Lab Results  Component Value Date   HGBA1C 5.6 06/15/2016   MPG 111 04/21/2014   No results found for: PROLACTIN Lab Results  Component Value Date   CHOL 139 08/15/2017   TRIG 116 08/15/2017   HDL 51 08/15/2017   CHOLHDL 2.7 08/15/2017   VLDL 41 (H) 03/28/2016   LDLCALC 68 08/15/2017   [place]  02/15/2017     Comment:     . LDL cholesterol not calculated. Triglyceride levels greater than 400 mg/dL invalidate calculated LDL results. . Reference range: <100 . Desirable range <100 mg/dL for primary prevention;   <70 mg/dL for patients with CHD or diabetic patients  with > or = 2 CHD risk factors. [name] LDL-C is now calculated using the Martin-Hopkins  calculation, which is a validated novel method providing  better accuracy than the Friedewald equation in the  estimation of LDL-C.  Cresenciano Genre et al. Annamaria Helling. 0254;270(62): 2061-2068  (http://education.[facility].com/faq/FAQ164)    Lab Results  Component Value Date   TSH 1.18 08/29/2017   TSH 1.023 08/29/2010    Therapeutic Level Labs: No results found for: LITHIUM Lab Results  Component Value Date   VALPROATE 27.5 (L) 08/14/2016   VALPROATE CANCELED 06/15/2016   No components found for:  CBMZ  Current Medications: Current Outpatient Medications  Medication Sig Dispense Refill  . amLODipine (NORVASC) 10 MG tablet Take 1 tablet (10 mg total) by mouth daily. 90 tablet 2  . atorvastatin (LIPITOR) 20 MG tablet TAKE 1 TABLET BY MOUTH DAILY 90 tablet 1  . BIKTARVY 50-200-25 MG TABS tablet TAKE 1 TABLET BY MOUTH DAILY 30 tablet 1  . buPROPion (WELLBUTRIN XL) 300 MG 24 hr tablet TAKE 1 TABLET BY MOUTH DAILY 90 tablet 0  . clonazePAM (KLONOPIN) 1 MG tablet Take 1 tablet (1 mg total) by mouth at bedtime. 90 tablet 0  . divalproex (DEPAKOTE ER) 500 MG 24 hr tablet TAKE 1  TABLET(500 MG) BY MOUTH AT BEDTIME 90 tablet 0  . metoprolol succinate (TOPROL-XL) 25 MG 24 hr tablet Take 12.5 mg by mouth 2 (two) times daily.    . ondansetron (ZOFRAN) 4 MG tablet TAKE 1 TABLET(4 MG) BY MOUTH THREE TIMES DAILY AS NEEDED FOR NAUSEA 60 tablet 1  . fluoruracil (CARAC) 0.5 % cream Apply topically daily.    [name]  ibuprofen (ADVIL,MOTRIN) 800 MG tablet Take 1 tablet (800 mg total) by mouth every 8 (eight) hours as needed. (Patient not taking: Reported on 02/14/2018) 90 tablet 5   No current facility-administered medications for this visit.      Musculoskeletal: Strength & Muscle Tone: within normal limits Gait & Station: normal Patient leans: N/A  Psychiatric Specialty Exam: Review of Systems  Constitutional: Positive for malaise/fatigue.  Gastrointestinal: Positive for diarrhea and nausea.  Psychiatric/Behavioral: The patient is nervous/anxious.     Blood pressure 116/75, pulse 64, height 5' 6.5" (1.689 m), weight 157 lb (71.2 kg), SpO2 98 %.Body mass index is 24.96 kg/m.  General Appearance: Casual  Eye Contact:  Good  Speech:  Clear and Coherent  Volume:  Normal  Mood:  Euthymic  Affect:  Congruent  Thought Process:  Goal Directed  Orientation:  Full (Time, Place, and Person)  Thought Content: Logical   Suicidal Thoughts:  No  Homicidal Thoughts:  No  Memory:  Immediate;   Good Recent;   Good Remote;   Good  Judgement:  Good  Insight:  Good  Psychomotor Activity:  Normal  Concentration:  Concentration: Good and Attention Span: Good  Recall:  Good  Fund of Knowledge: Good  Language: Good  Akathisia:  No  Handed:  Right  AIMS (if indicated): not done  Assets:  Communication Skills Desire for Improvement Housing Resilience  ADL's:  Intact  Cognition: WNL  Sleep:  Good   Screenings: [place] from 02/15/2017 in [facility] for Infectious [place] from 12/27/2016 in [facility] for Infectious Disease Office Visit from 11/29/2016 in [facility] for Infectious Disease Office Visit from 04/13/2016 in [facility] for Infectious Disease Office Visit from 04/28/2015 in [facility] for Infectious Disease  PHQ-2 Total Score  2  6  6  6  5   PHQ-9 Total Score  13  21  16  10  15        Assessment and Plan: Bipolar disorder type I.  Generalized anxiety disorder.  I discussed his chronic diarrhea and fatigue.  I recommended to try Lamictal.  He had a good response with Lamictal in the past but at that time he could not afford.  Now he can afford Lamictal.  His diarrhea and fatigue may be caused by Depakote.  If he continues to have diarrhea then I recommend him to have seen by GI and have endoscopy or colonoscopy.  We discussed Lamictal side effects including rash that if he ever develop a rash then he need to stop the medication immediately.  We will taper Depakote to 250 mg daily for 2 weeks and then he will stop.  He will start Lamictal 25 mg daily for 2 weeks and then 50 mg daily.  We will consider increasing the dose on his next appointment if he is able to tolerate the medication.  Continue Wellbutrin XL 300 mg daily and Klonopin 1 mg at bedtime.  One more time I offered therapy but is not interested.  I recommended to call us back if he has any question, concern if he feels worsening of the symptoms.  Follow-up in 2 months.   Kathlee Nations, MD 02/14/2018, 11:09 AM

## 2018-03-01 ENCOUNTER — Other Ambulatory Visit: Payer: Medicare Other

## 2018-03-01 ENCOUNTER — Other Ambulatory Visit (HOSPITAL_COMMUNITY)
Admission: RE | Admit: 2018-03-01 | Discharge: 2018-03-01 | Disposition: A | Discharge status: Home / Self Care | Payer: Medicare Other | Source: Ambulatory Visit | Attending: Infectious Disease | Admitting: Infectious Disease

## 2018-03-01 DIAGNOSIS — B20 Human immunodeficiency virus [HIV] disease: Secondary | ICD-10-CM

## 2018-03-01 DIAGNOSIS — F331 Major depressive disorder, recurrent, moderate: Secondary | ICD-10-CM

## 2018-03-01 DIAGNOSIS — R002 Palpitations: Secondary | ICD-10-CM | POA: Insufficient documentation

## 2018-03-01 DIAGNOSIS — E782 Mixed hyperlipidemia: Secondary | ICD-10-CM | POA: Insufficient documentation

## 2018-03-01 DIAGNOSIS — I10 Essential (primary) hypertension: Secondary | ICD-10-CM

## 2018-03-01 DIAGNOSIS — R5383 Other fatigue: Secondary | ICD-10-CM

## 2018-03-01 LAB — T-HELPER CELL (CD4) - (RCID CLINIC ONLY)
CD4 % Helper T Cell: 33 % (ref 33–55)
CD4 T Cell Abs: 940 /uL (ref 400–2700)

## 2018-03-03 ENCOUNTER — Other Ambulatory Visit: Payer: Self-pay | Admitting: Infectious Disease

## 2018-03-03 DIAGNOSIS — R110 Nausea: Secondary | ICD-10-CM

## 2018-03-04 LAB — URINE CYTOLOGY ANCILLARY ONLY
Chlamydia: NEGATIVE
Neisseria Gonorrhea: NEGATIVE

## 2018-03-05 LAB — COMPLETE METABOLIC PANEL WITH GFR
AG Ratio: 1.8 (calc) (ref 1.0–2.5)
ALT: 15 U/L (ref 9–46)
AST: 17 U/L (ref 10–35)
Albumin: 4.8 g/dL (ref 3.6–5.1)
Alkaline phosphatase (APISO): 70 U/L (ref 40–115)
BUN: 12 mg/dL (ref 7–25)
CO2: 31 mmol/L (ref 20–32)
Calcium: 10.1 mg/dL (ref 8.6–10.3)
Chloride: 102 mmol/L (ref 98–110)
Creat: 1.18 mg/dL (ref 0.70–1.33)
GFR, Est African American: 78 mL/min/{1.73_m2} (ref >=60)
GFR, Est Non African American: 67 mL/min/{1.73_m2} (ref >=60)
Globulin: 2.7 g/dL (calc) (ref 1.9–3.7)
Glucose, Bld: 78 mg/dL (ref 65–99)
Potassium: 4.1 mmol/L (ref 3.5–5.3)
SODIUM: 141 mmol/L (ref 135–146)
Total Bilirubin: 1.1 mg/dL (ref 0.2–1.2)
Total Protein: 7.5 g/dL (ref 6.1–8.1)

## 2018-03-05 LAB — LIPID PANEL
Cholesterol: 152 mg/dL (ref <200)
HDL: 51 mg/dL (ref >40)
LDL Cholesterol (Calc): 71 mg/dL (calc)
Non-HDL Cholesterol (Calc): 101 mg/dL (calc) (ref <130)
Total CHOL/HDL Ratio: 3 (calc) (ref <5.0)
Triglycerides: 207 mg/dL — ABNORMAL HIGH (ref <150)

## 2018-03-05 LAB — CBC WITH DIFFERENTIAL/PLATELET
Absolute Monocytes: 766 cells/uL (ref 200–950)
BASOS PCT: 0.2 %
Basophils Absolute: 17 cells/uL (ref 0–200)
Eosinophils Absolute: 61 cells/uL (ref 15–500)
Eosinophils Relative: 0.7 %
HCT: 46 % (ref 38.5–50.0)
Hemoglobin: 16.6 g/dL (ref 13.2–17.1)
Lymphs Abs: 2784 cells/uL (ref 850–3900)
MCH: 33.1 pg — AB (ref 27.0–33.0)
MCHC: 36.1 g/dL — ABNORMAL HIGH (ref 32.0–36.0)
MCV: 91.6 fL (ref 80.0–100.0)
MPV: 11.1 fL (ref 7.5–12.5)
Monocytes Relative: 8.8 %
Neutro Abs: 5072 cells/uL (ref 1500–7800)
Neutrophils Relative %: 58.3 %
Platelets: 169 10*3/uL (ref 140–400)
RBC: 5.02 10*6/uL (ref 4.20–5.80)
RDW: 13 % (ref 11.0–15.0)
Total Lymphocyte: 32 %
WBC: 8.7 10*3/uL (ref 3.8–10.8)

## 2018-03-05 LAB — RPR: RPR Ser Ql: NONREACTIVE

## 2018-03-05 LAB — HIV-1 RNA QUANT-NO REFLEX-BLD
HIV 1 RNA Quant: <20 copies/mL — AB
HIV-1 RNA Quant, Log: <1.3 Log copies/mL — AB

## 2018-03-15 ENCOUNTER — Emergency Department (HOSPITAL_COMMUNITY): Payer: Medicare Other

## 2018-03-15 ENCOUNTER — Encounter: Payer: Self-pay | Admitting: Infectious Disease

## 2018-03-15 ENCOUNTER — Emergency Department (HOSPITAL_COMMUNITY)
Admission: EM | Admit: 2018-03-15 | Discharge: 2018-03-15 | Disposition: A | Discharge status: Home / Self Care | Payer: Medicare Other | Attending: Emergency Medicine | Admitting: Emergency Medicine

## 2018-03-15 ENCOUNTER — Ambulatory Visit (INDEPENDENT_AMBULATORY_CARE_PROVIDER_SITE_OTHER): Payer: Medicare Other | Admitting: Infectious Disease

## 2018-03-15 ENCOUNTER — Other Ambulatory Visit: Payer: Self-pay

## 2018-03-15 VITALS — BP 149/76 | HR 57 | Temp 97.7°F | Wt 158.0 lb

## 2018-03-15 DIAGNOSIS — F331 Major depressive disorder, recurrent, moderate: Secondary | ICD-10-CM | POA: Diagnosis not present

## 2018-03-15 DIAGNOSIS — R251 Tremor, unspecified: Secondary | ICD-10-CM

## 2018-03-15 DIAGNOSIS — F4310 Post-traumatic stress disorder, unspecified: Secondary | ICD-10-CM

## 2018-03-15 DIAGNOSIS — R42 Dizziness and giddiness: Secondary | ICD-10-CM

## 2018-03-15 DIAGNOSIS — F3130 Bipolar disorder, current episode depressed, mild or moderate severity, unspecified: Secondary | ICD-10-CM

## 2018-03-15 DIAGNOSIS — I491 Atrial premature depolarization: Secondary | ICD-10-CM | POA: Insufficient documentation

## 2018-03-15 DIAGNOSIS — R002 Palpitations: Secondary | ICD-10-CM

## 2018-03-15 DIAGNOSIS — Z79899 Other long term (current) drug therapy: Secondary | ICD-10-CM | POA: Diagnosis not present

## 2018-03-15 DIAGNOSIS — I10 Essential (primary) hypertension: Secondary | ICD-10-CM

## 2018-03-15 DIAGNOSIS — B20 Human immunodeficiency virus [HIV] disease: Secondary | ICD-10-CM

## 2018-03-15 DIAGNOSIS — R0602 Shortness of breath: Secondary | ICD-10-CM | POA: Diagnosis present

## 2018-03-15 HISTORY — DX: Dizziness and giddiness: R42

## 2018-03-15 HISTORY — DX: Atrial premature depolarization: I49.1

## 2018-03-15 LAB — BASIC METABOLIC PANEL
Anion gap: 10 (ref 5–15)
BUN: 12 mg/dL (ref 6–20)
CO2: 24 mmol/L (ref 22–32)
Calcium: 9.4 mg/dL (ref 8.9–10.3)
Chloride: 106 mmol/L (ref 98–111)
Creatinine, Ser: 1.18 mg/dL (ref 0.61–1.24)
GFR calc Af Amer: >60 mL/min (ref >60)
GFR calc non Af Amer: >60 mL/min (ref >60)
GLUCOSE: 106 mg/dL — AB (ref 70–99)
Potassium: 3.8 mmol/L (ref 3.5–5.1)
Sodium: 140 mmol/L (ref 135–145)

## 2018-03-15 LAB — CBC WITH DIFFERENTIAL/PLATELET
Abs Immature Granulocytes: 0.03 10*3/uL (ref 0.00–0.07)
Basophils Absolute: 0 10*3/uL (ref 0.0–0.1)
Basophils Relative: 0 %
Eosinophils Absolute: 0.1 10*3/uL (ref 0.0–0.5)
Eosinophils Relative: 1 %
HCT: 44.9 % (ref 39.0–52.0)
Hemoglobin: 16.3 g/dL (ref 13.0–17.0)
Immature Granulocytes: 0 %
Lymphocytes Relative: 44 %
Lymphs Abs: 3.8 10*3/uL (ref 0.7–4.0)
MCH: 32.9 pg (ref 26.0–34.0)
MCHC: 36.3 g/dL — ABNORMAL HIGH (ref 30.0–36.0)
MCV: 90.7 fL (ref 80.0–100.0)
MONO ABS: 0.8 10*3/uL (ref 0.1–1.0)
Monocytes Relative: 9 %
NEUTROS ABS: 3.9 10*3/uL (ref 1.7–7.7)
Neutrophils Relative %: 46 %
PLATELETS: 215 10*3/uL (ref 150–400)
RBC: 4.95 MIL/uL (ref 4.22–5.81)
RDW: 12.2 % (ref 11.5–15.5)
WBC: 8.6 10*3/uL (ref 4.0–10.5)
nRBC: 0 % (ref 0.0–0.2)

## 2018-03-15 LAB — I-STAT TROPONIN, ED: Troponin i, poc: 0 ng/mL (ref 0.00–0.08)

## 2018-03-15 NOTE — ED Triage Notes (Signed)
Pt c/o slight sob that began today ; denies any cough ; pt also c/o light headedness that started today ; prt denies any chest pain ; pt c/o palpitations at this time

## 2018-03-15 NOTE — Progress Notes (Signed)
Chief complaint: Palpitations and lightheadedness and dizziness    Subjective:    Patient ID: Alexis Welch, male    DOB: 1960-05-01, 58 y.o.   MRN: [id number]  HPI  Alexis Welch is a 58 y.o. male who is doing superbly well on BIKTARVY. He is under the care of [name] for his psychiatric conditions.   This was a routine clinic visit he did wake up this morning with dizziness and the sensation of the room spinning also accompanied by heart palpitations with his heart speeding up and slowing down.  He does feel a sensation in his chest as if he is short of breath but no heaviness or chest pain or nausea vomiting no diaphoresis or numbness in his arm.  In our exam room we are attempting to get an EKG but his baseline tremors are interfering with optimal read on EKG.  His pulse has been fluctuating between the 60s up to the 120s.  Clearly he needs to go to the emergency department for further monitoring and work-up.  Have a history of premature atrial contractions and has been followed by cardiologist at [facility] though I cannot see the records he is on metoprolol for this.  He does not have a known history of atrial fibrillation.    Past Medical History:  Diagnosis Date  . Anxiety   . Arthritis   . Bipolar I disorder, most recent episode depressed ([place]) 11/23/2014  . Depression   . Dizzy 03/15/2018  . Hemangioma 04/28/2015  . HIV disease ([place])   . Hypertension   . Testicular pain 04/13/2016  . Tremor 09/07/2015  . 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  Family History  Problem Relation Age of Onset  . Cancer Father        colon cancer  . Alcohol abuse Father   . Dementia Father   . Heart disease Maternal Uncle   . Alcohol abuse Maternal Uncle   . Depression Mother   . Physical abuse Mother   . Bipolar disorder Sister   . Schizophrenia Sister   . OCD Sister   . Alcohol abuse Brother   . Drug abuse Brother   . Seizures  Brother   . Alcohol abuse Maternal Aunt   . Depression Maternal Aunt   . Alcohol abuse Paternal Uncle   . Depression Paternal Uncle       Social History   Socioeconomic History  . Marital status: Single    Spouse name: Not on file  . Number of children: Not on file  . Years of education: Not on file  . Highest education level: Not on file  Occupational History  . Not on file  Social Needs  . Financial resource strain: Not on file  . Food insecurity:    Worry: Not on file    Inability: Not on file  . Transportation needs:    Medical: Not on file    Non-medical: Not on file  Tobacco Use  . Smoking status: Never Smoker  . Smokeless tobacco: Never Used  Substance and Sexual Activity  . Alcohol use: No    Alcohol/week: 0.0 standard drinks  . Drug use: Yes    Frequency: 3.0 times per week    Types: Marijuana  . Sexual activity: Not Currently    Partners: Male    Birth control/protection: Condom    Comment: declined condoms  Lifestyle  . Physical activity:    Days per week:  Not on file    Minutes per session: Not on file  . Stress: Not on file  Relationships  . Social connections:    Talks on phone: Not on file    Gets together: Not on file    Attends religious service: Not on file    Active member of club or organization: Not on file    Attends meetings of clubs or organizations: Not on file    Relationship status: Not on file  Other Topics Concern  . Not on file  Social History Narrative  . Not on file    No Known Allergies   Current Outpatient Medications:  .  amLODipine (NORVASC) 10 MG tablet, Take 1 tablet (10 mg total) by mouth daily., Disp: 90 tablet, Rfl: 2 .  atorvastatin (LIPITOR) 20 MG tablet, TAKE 1 TABLET BY MOUTH DAILY, Disp: 90 tablet, Rfl: 1 .  BIKTARVY 50-200-25 MG TABS tablet, TAKE 1 TABLET BY MOUTH DAILY, Disp: 30 tablet, Rfl: 1 .  buPROPion (WELLBUTRIN XL) 300 MG 24 hr tablet, TAKE 1 TABLET BY MOUTH DAILY, Disp: 90 tablet, Rfl: 0 .   clonazePAM (KLONOPIN) 1 MG tablet, Take 1 tablet (1 mg total) by mouth at bedtime., Disp: 90 tablet, Rfl: 0 .  fluoruracil (CARAC) 0.5 % cream, Apply topically daily., Disp: , Rfl:  .  ibuprofen (ADVIL,MOTRIN) 800 MG tablet, Take 1 tablet (800 mg total) by mouth every 8 (eight) hours as needed., Disp: 90 tablet, Rfl: 5 .  lamoTRIgine (LAMICTAL) 25 MG tablet, Take one tab daily for 2 weeks and than take 2 tab daily, Disp: 60 tablet, Rfl: 1 .  metoprolol succinate (TOPROL-XL) 25 MG 24 hr tablet, Take 12.5 mg by mouth 2 (two) times daily., Disp: , Rfl:  .  ondansetron (ZOFRAN) 4 MG tablet, TAKE 1 TABLET(4 MG) BY MOUTH THREE TIMES DAILY AS NEEDED FOR NAUSEA, Disp: 60 tablet, Rfl: 1 .  divalproex (DEPAKOTE ER) 250 MG 24 hr tablet, Take one tab daily for two weeks and than stop (Patient not taking: Reported on 03/15/2018), Disp: 15 tablet, Rfl: 0   .Review of Systems  Constitutional: Negative for activity change, appetite change, chills, diaphoresis, fatigue, fever and unexpected weight change.  HENT: Negative for congestion, nosebleeds, postnasal drip, rhinorrhea, sinus pressure, sneezing, sore throat and trouble swallowing.   Eyes: Negative for photophobia and visual disturbance.  Respiratory: Positive for shortness of breath. Negative for cough, chest tightness, wheezing and stridor.   Cardiovascular: Positive for palpitations. Negative for chest pain and leg swelling.  Gastrointestinal: Negative for abdominal distention, abdominal pain, anal bleeding, blood in stool, constipation, diarrhea and vomiting.  Genitourinary: Negative for difficulty urinating, dysuria, flank pain and hematuria.  Musculoskeletal: Negative for back pain, gait problem, joint swelling and myalgias.  Skin: Negative for color change, pallor and wound.  Neurological: Positive for dizziness and tremors. Negative for weakness and light-headedness.  Hematological: Negative for adenopathy. Does not bruise/bleed easily.   Psychiatric/Behavioral: Negative for agitation, confusion, decreased concentration and sleep disturbance. The patient is nervous/anxious.        Objective:   Physical Exam  Constitutional: He is oriented to person, place, and time. He appears well-developed and well-nourished. No distress.  HENT:  Head: Normocephalic and atraumatic.  Mouth/Throat: Oropharynx is clear and moist. No oropharyngeal exudate.  Eyes: Pupils are equal, round, and reactive to light. Conjunctivae and EOM are normal. No scleral icterus.  Neck: Normal range of motion. Neck supple. No JVD present.  Cardiovascular: An irregular rhythm present. Tachycardia  present. Exam reveals friction rub. Exam reveals no gallop.  No murmur heard. Pulmonary/Chest: Effort normal and breath sounds normal. No respiratory distress. He has no wheezes. He has no rales. He exhibits no tenderness.  Abdominal: Soft. He exhibits no distension.  Musculoskeletal:        General: No tenderness or edema.  Lymphadenopathy:    He has no cervical adenopathy.  Neurological: He is alert and oriented to person, place, and time. He displays tremor. He exhibits normal muscle tone. Coordination normal.  Skin: Skin is warm and dry. He is not diaphoretic. No erythema. There is pallor.  Psychiatric: His behavior is normal. Judgment and thought content normal. His mood appears anxious.  Nursing note and vitals reviewed.         Assessment & Plan:   Dizziness with palpitations and alternations between bradycardia and tachycardia:  Is to go to the emergency department for evaluation and treatment potentially admission to the hospital.  SWH:[id number] with BIKTARVY, hopefully HMA P has been renewed  Lab Results  Component Value Date   HIV1RNAQUANT <20 DETECTED (A) 03/01/2018   HIV1RNAQUANT <20 DETECTED (A) 08/15/2017   HIV1RNAQUANT <20 NOT DETECTED 02/15/2017    Lab Results  Component Value Date   CD4TABS 940 03/01/2018   CD4TABS 1,020  08/15/2017   CD4TABS 890 02/15/2017   CV risk: continue lipitor 20mg ,   Hyperlipidemia: see above  Bipolar, Depression: continue to see  [name] and [facility] counselors.  HTN: continue  Norvasc., metoprolol Vitals:   03/15/18 1044  BP: (!) 149/76  Pulse: (!) 57  Temp: 97.7 F (36.5 C)   PTSD: seeing [name]  I spent greater than 25 minutes with the patient including greater than 50% of time in face to face counsel of the patient regarding his HIV, regarding work-up of his tachycardia-bradycardia palpitations dizziness history of premature atrial contractions, review of outside records and in coordination of his care.

## 2018-03-15 NOTE — Discharge Instructions (Signed)
Please read and follow all provided instructions.  Your diagnoses today include:  1. Palpitations    Tests performed today include:  An EKG of your heart  A chest x-ray  Cardiac enzymes - a blood test for heart muscle damage  Blood counts and electrolytes  Vital signs. See below for your results today.   Medications prescribed:   None  Take any prescribed medications only as directed.  Follow-up instructions: Please follow-up with your cardiologist for further evaluation of your symptoms.   Return instructions:  SEEK IMMEDIATE MEDICAL ATTENTION IF:  You have severe chest pain, especially if the pain is crushing or pressure-like and spreads to the arms, back, neck, or jaw, or if you have sweating, nausea (feeling sick to your stomach), or shortness of breath. THIS IS AN EMERGENCY. Don't wait to see if the pain will go away. Get medical help at once. Call 911 or 0 (operator). DO NOT drive yourself to the hospital.   Your chest pain gets worse and does not go away with rest.   You have an attack of chest pain lasting longer than usual, despite rest and treatment with the medications your caregiver has prescribed.   You wake from sleep with chest pain or shortness of breath.  You feel dizzy or faint.  You have chest pain not typical of your usual pain for which you originally saw your caregiver.   You have any other emergent concerns regarding your health.  Additional Information: Chest pain comes from many different causes. Your caregiver has diagnosed you as having chest pain that is not specific for one problem, but does not require admission.  You are at low risk for an acute heart condition or other serious illness.   Your vital signs today were: BP (!) 149/101 (BP Location: Right Arm)    Pulse 95    Temp (!) 97.5 F (36.4 C) (Oral)    Resp 15    Ht 5\' 7"  (1.702 m)    Wt 71 kg    SpO2 98%    BMI 24.52 kg/m  If your blood pressure (BP) was elevated above 135/85 this  visit, please have this repeated by your doctor within one month. --------------

## 2018-03-15 NOTE — ED Notes (Signed)
Pt was able to walk around room with strong and steady gait ; pt denies any weakness dizziness or further sob or palpitations ; pt states " I feel normal "
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
## 2018-03-23 ENCOUNTER — Other Ambulatory Visit: Payer: Self-pay | Admitting: Infectious Disease

## 2018-03-23 DIAGNOSIS — E785 Hyperlipidemia, unspecified: Secondary | ICD-10-CM

## 2018-03-29 ENCOUNTER — Encounter: Payer: Self-pay | Admitting: Infectious Disease

## 2018-04-06 ENCOUNTER — Other Ambulatory Visit: Payer: Self-pay | Admitting: Infectious Disease

## 2018-04-06 DIAGNOSIS — B20 Human immunodeficiency virus [HIV] disease: Secondary | ICD-10-CM

## 2018-04-15 ENCOUNTER — Ambulatory Visit (HOSPITAL_COMMUNITY): Payer: Medicare Other | Admitting: Psychiatry

## 2018-04-15 ENCOUNTER — Encounter (HOSPITAL_COMMUNITY): Payer: Self-pay | Admitting: Psychiatry

## 2018-04-15 DIAGNOSIS — F411 Generalized anxiety disorder: Secondary | ICD-10-CM

## 2018-04-15 DIAGNOSIS — F319 Bipolar disorder, unspecified: Secondary | ICD-10-CM | POA: Diagnosis not present

## 2018-04-15 MED ORDER — BUPROPION HCL ER (XL) 300 MG PO TB24: ORAL_TABLET | ORAL | 0 refills | Status: DC

## 2018-04-15 MED ORDER — LAMOTRIGINE 100 MG PO TABS: 100.0000 mg | ORAL_TABLET | Freq: Every day | ORAL | 0 refills | Status: DC

## 2018-04-15 MED ORDER — CLONAZEPAM 1 MG PO TABS: 1.0000 mg | ORAL_TABLET | Freq: Every day | ORAL | 0 refills | Status: DC

## 2018-04-15 NOTE — Progress Notes (Signed)
[facility] MD/PA/NP OP Progress Note  04/15/2018 10:06 AM Alexis Welch  MRN:  [id number]  Chief Complaint: I like Lamictal.  My diarrhea stops and I have more energy.  HPI: Alexis Welch came for his appointment.  On his last visit we stopped the Depakote due to persistent chronic diarrhea and lack of energy.  We switched to Lamictal which had helped him a lot.  His diarrhea stops and he has more motivation to do things.  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  He is sleeping better.  He denies any panic attack or any feeling of hopelessness.  Sometime he gets tired easily but he is seen much improvement in his mood.  He has no rash, itching, tremors.  He denies any paranoia or any hallucination.  He denies any suicidal thoughts or homicidal thought.  He is not drinking or using any illegal substances.  He is sleeping better.  He admitted sometimes negative thoughts but they are not as intense and less frequent.  Visit Diagnosis:    ICD-10-CM   1. Bipolar 1 disorder (HCC) F31.9 lamoTRIgine (LAMICTAL) 100 MG tablet    buPROPion (WELLBUTRIN XL) 300 MG 24 hr tablet  2. GAD (generalized anxiety disorder) F41.1 lamoTRIgine (LAMICTAL) 100 MG tablet    clonazePAM (KLONOPIN) 1 MG tablet    buPROPion (WELLBUTRIN XL) 300 MG 24 hr tablet    Past Psychiatric History: Reviewed. H/O anxiety and bipolar disorder.  No h/o inpatient or any suicidal attempt.  Tried Celexa, Trileptal, Lamictal, Lexapro, Zyprexa and Depakote.  Depakote and Lexapro caused diarrhea.      Past Medical History:  Past Medical History:  Diagnosis Date  . Anxiety   . Arthritis   . Bipolar I disorder, most recent episode depressed ([place]) 11/23/2014  . Depression   . Dizzy 03/15/2018  . Hemangioma 04/28/2015  . HIV disease ([place])   . Hypertension   . PAC (premature atrial contraction) 03/15/2018  . Testicular pain 04/13/2016  . Tremor 09/07/2015  . Tremor due to multiple drugs  11/23/2014   No past surgical history on file.  Family Psychiatric History: Reviewed.  Family History:  Family History  Problem Relation Age of Onset  . Cancer Father        colon cancer  . Alcohol abuse Father   . Dementia Father   . Heart disease Maternal Uncle   . Alcohol abuse Maternal Uncle   . Depression Mother   . Physical abuse Mother   . Bipolar disorder Sister   . Schizophrenia Sister   . OCD Sister   . Alcohol abuse Brother   . Drug abuse Brother   . Seizures Brother   . Alcohol abuse Maternal Aunt   . Depression Maternal Aunt   . Alcohol abuse Paternal Uncle   . Depression Paternal Uncle     Social History:  Social History   Socioeconomic History  . Marital status: Single    Spouse name: Not on file  . Number of children: Not on file  . Years of education: Not on file  . Highest education level: Not on file  Occupational History  . Not on file  Social Needs  . Financial resource strain: Not on file  . Food insecurity:    Worry: Not on file    Inability: Not on file  . Transportation needs:    Medical: Not on file    Non-medical: Not on file  Tobacco Use  . Smoking status: Never Smoker  . Smokeless tobacco: Never Used  Substance and Sexual Activity  . Alcohol use: No    Alcohol/week: 0.0 standard drinks  . Drug use: Yes    Frequency: 3.0 times per week    Types: Marijuana  . Sexual activity: Not Currently    Partners: Male    Birth control/protection: Condom    Comment: declined condoms  Lifestyle  . Physical activity:    Days per week: Not on file    Minutes per session: Not on file  . Stress: Not on file  Relationships  . Social connections:    Talks on phone: Not on file    Gets together: Not on file    Attends religious service: Not on file    Active member of club or organization: Not on file    Attends meetings of clubs or organizations: Not on file    Relationship status: Not on file  Other Topics Concern  . Not on file   Social History Narrative  . Not on file    Allergies: No Known Allergies  Metabolic Disorder Labs: Lab Results  Component Value Date   HGBA1C 5.6 06/15/2016   MPG 111 04/21/2014   No results found for: PROLACTIN Lab Results  Component Value Date   CHOL 152 03/01/2018   TRIG 207 (H) 03/01/2018   HDL 51 03/01/2018   CHOLHDL 3.0 03/01/2018   VLDL 41 (H) 03/28/2016   LDLCALC 71 03/01/2018   LDLCALC 68 08/15/2017   Lab Results  Component Value Date   TSH 1.18 08/29/2017   TSH 1.023 08/29/2010    Therapeutic Level Labs: No results found for: LITHIUM Lab Results  Component Value Date   VALPROATE 27.5 (L) 08/14/2016   VALPROATE CANCELED 06/15/2016   No components found for:  CBMZ  Current Medications: Current Outpatient Medications  Medication Sig Dispense Refill  . amLODipine (NORVASC) 10 MG tablet Take 1 tablet (10 mg total) by mouth daily. 90 tablet 2  . atorvastatin (LIPITOR) 20 MG tablet TAKE 1 TABLET BY MOUTH DAILY 90 tablet 1  . BIKTARVY 50-200-25 MG TABS tablet TAKE 1 TABLET BY MOUTH DAILY 30 tablet 2  . buPROPion (WELLBUTRIN XL) 300 MG 24 hr tablet TAKE 1 TABLET BY MOUTH DAILY 90 tablet 0  . clonazePAM (KLONOPIN) 1 MG tablet Take 1 tablet (1 mg total) by mouth at bedtime. 90 tablet 0  . lamoTRIgine (LAMICTAL) 25 MG tablet Take one tab daily for 2 weeks and than take 2 tab daily (Patient taking differently: Take 50 mg by mouth daily. Take one tab daily for 2 weeks and than take 2 tab daily) 60 tablet 1  . metoprolol tartrate (LOPRESSOR) 25 MG tablet Take 12.5 mg by mouth 2 (two) times daily.    . ondansetron (ZOFRAN) 4 MG tablet TAKE 1 TABLET(4 MG) BY MOUTH THREE TIMES DAILY AS NEEDED FOR NAUSEA (Patient taking differently: Take 4 mg by mouth every 8 (eight) hours as needed for nausea. ) 60 tablet 1  . loratadine (CLARITIN) 10 MG tablet Take 10 mg by mouth as needed for allergies.     No current facility-administered medications for this visit.       Musculoskeletal: Strength & Muscle Tone: within normal limits Gait & Station: normal Patient leans: N/A  Psychiatric Specialty Exam: ROS  Blood pressure 139/68, pulse 69, height 5\' 7"  (1.702 m), weight 157 lb (71.2 kg), SpO2 99 %.Body mass index is 24.59 kg/m.  General  Appearance: Casual  Eye Contact:  Good  Speech:  Clear and Coherent  Volume:  Normal  Mood:  Euthymic  Affect:  Congruent  Thought Process:  Goal Directed  Orientation:  Full (Time, Place, and Person)  Thought Content: Logical   Suicidal Thoughts:  No  Homicidal Thoughts:  No  Memory:  Immediate;   Good Recent;   Good Remote;   Good  Judgement:  Good  Insight:  Good  Psychomotor Activity:  Normal  Concentration:  Concentration: Fair and Attention Span: Fair  Recall:  Good  Fund of Knowledge: Good  Language: Good  Akathisia:  No  Handed:  Right  AIMS (if indicated): not done  Assets:  Communication Skills Desire for Improvement Housing Resilience Transportation  ADL's:  Intact  Cognition: WNL  Sleep:  Good   Screenings: [place] from 02/15/2017 in [facility] for Infectious [place] from 12/27/2016 in [facility] for Infectious Disease Office Visit from 11/29/2016 in [facility] for Infectious Disease Office Visit from 04/13/2016 in [facility] for Infectious Disease Office Visit from 04/28/2015 in [facility] for Infectious Disease  PHQ-2 Total Score  2  6  6  6  5   PHQ-9 Total Score  13  21  16  10  15        Assessment and Plan: Bipolar disorder type I.  Generalized anxiety disorder.  Patient doing much better since we started Lamictal.  He has no more diarrhea and his energy level is better.  He has more motivation.  He started working part-time to Biomedical scientist.  He has no rash, itching, tremors or shakes.  He has no tremors or shakes.  I will continue  Wellbutrin XL 300 mg daily, Klonopin 1 mg at bedtime and we will increase Lamictal 100 mg daily.  Reminded that if he has a rash then he need to stop the medicine and call us immediately.  Discussed medication side effects and benefits.  Recommended to call us back if is any question or any concern.  Follow-up in 3 months.     Kathlee Nations, MD 04/15/2018, 10:06 AM

## 2018-07-12 ENCOUNTER — Other Ambulatory Visit: Payer: Self-pay | Admitting: Infectious Disease

## 2018-07-12 DIAGNOSIS — B20 Human immunodeficiency virus [HIV] disease: Secondary | ICD-10-CM

## 2018-07-15 ENCOUNTER — Other Ambulatory Visit: Payer: Self-pay | Admitting: Infectious Disease

## 2018-07-15 DIAGNOSIS — R110 Nausea: Secondary | ICD-10-CM

## 2018-07-15 DIAGNOSIS — I10 Essential (primary) hypertension: Secondary | ICD-10-CM

## 2018-07-16 ENCOUNTER — Ambulatory Visit (INDEPENDENT_AMBULATORY_CARE_PROVIDER_SITE_OTHER): Payer: Medicare Other | Admitting: Psychiatry

## 2018-07-16 ENCOUNTER — Other Ambulatory Visit: Payer: Self-pay

## 2018-07-16 ENCOUNTER — Encounter (HOSPITAL_COMMUNITY): Payer: Self-pay | Admitting: Psychiatry

## 2018-07-16 DIAGNOSIS — F411 Generalized anxiety disorder: Secondary | ICD-10-CM | POA: Diagnosis not present

## 2018-07-16 DIAGNOSIS — F319 Bipolar disorder, unspecified: Secondary | ICD-10-CM

## 2018-07-16 MED ORDER — CLONAZEPAM 1 MG PO TABS: 1.0000 mg | ORAL_TABLET | Freq: Every day | ORAL | 0 refills | Status: DC

## 2018-07-16 MED ORDER — LAMOTRIGINE 100 MG PO TABS: 100.0000 mg | ORAL_TABLET | Freq: Every day | ORAL | 0 refills | Status: DC

## 2018-07-16 MED ORDER — BUPROPION HCL ER (XL) 300 MG PO TB24: ORAL_TABLET | ORAL | 0 refills | Status: DC

## 2018-07-16 NOTE — Progress Notes (Signed)
Virtual Visit via Telephone Note  I connected with Carlyn Reichert on 07/16/18 at 10:00 AM EDT by telephone and verified that I am speaking with the correct person using two identifiers.   I discussed the limitations, risks, security and privacy concerns of performing an evaluation and management service by telephone and the availability of in person appointments. I also discussed with the patient that there may be a patient responsible charge related to this service. The patient expressed understanding and agreed to proceed.   History of Present Illness: Patient was evaluated by phone session.  He is doing very well on his current medication.  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  He endorses his motivation is back.  He does not want to change his medication.  He denies any mania, irritability, mood swings or any anger.  He is sleeping better.  He does not have any panic attack.  He denies any crying spells or any feeling of hopelessness or worthlessness.  He like to continue his medicine which is Klonopin, Lamictal and Wellbutrin.  He has no rash or any itching.  His energy level is good.  His appetite is okay.  He reported his weight is stable.  He denies drinking or using any illegal substances.     Past Psychiatric History: Reviewed. H/O anxiety and bipolar disorder.  No h/o inpatient or any suicidal attempt. Tried Celexa, Trileptal, Lamictal, Lexapro, Zyprexa and Depakote.  Depakote and Lexapro caused diarrhea.      Psychiatric Specialty Exam: Physical Exam  ROS  There were no vitals taken for this visit.There is no height or weight on file to calculate BMI.  General Appearance: NA  Eye Contact:  NA  Speech:  Clear and Coherent  Volume:  Normal  Mood:  Euthymic  Affect:  Negative  Thought Process:  Descriptions of Associations: Intact  Orientation:  Full (Time, Place, and Person)  Thought  Content:  Logical  Suicidal Thoughts:  No  Homicidal Thoughts:  No  Memory:  Immediate;   Good Recent;   Good Remote;   Good  Judgement:  Good  Insight:  Good  Psychomotor Activity:  NA  Concentration:  Concentration: Good and Attention Span: Good  Recall:  Good  Fund of Knowledge:  Good  Language:  Good  Akathisia:  NA  Handed:  Right  AIMS (if indicated):     Assets:  Communication Skills Desire for Improvement Housing Resilience Social Support Talents/Skills  ADL's:  Intact  Cognition:  WNL  Sleep:   good     Assessment and Plan: Bipolar disorder type I.  Generalized anxiety disorder.  Patient is a stable on his current medication.  He has no side effects including tremors shakes or any EPS.  He is more involved and feel more motivated to do things.  I will continue Wellbutrin XL 300 mg daily, Klonopin 1 mg at bedtime and Lamictal 100 mg daily.  Recommended to call us back if is any question or any concern.  Follow-up in 3 months.  Follow Up Instructions:    I discussed the assessment and treatment plan with the patient. The patient was provided an opportunity to ask questions and all were answered. The patient agreed with the plan and demonstrated an understanding of the instructions.   The patient was advised to call back or seek an in-person evaluation if the symptoms worsen or if the condition fails to  improve as anticipated.  I provided 15 minutes of non-face-to-face time during this encounter.   Kathlee Nations, MD

## 2018-10-10 ENCOUNTER — Other Ambulatory Visit: Payer: Self-pay | Admitting: Infectious Disease

## 2018-10-10 ENCOUNTER — Encounter: Payer: Self-pay | Admitting: Infectious Disease

## 2018-10-10 ENCOUNTER — Telehealth: Payer: Self-pay

## 2018-10-10 DIAGNOSIS — I10 Essential (primary) hypertension: Secondary | ICD-10-CM

## 2018-10-10 DIAGNOSIS — E785 Hyperlipidemia, unspecified: Secondary | ICD-10-CM

## 2018-10-10 NOTE — Telephone Encounter (Signed)
Called patient to schedule appointment for follow up with Dr. Tommy Medal. Patient is coming from [place], [place] so will do labs same day to avoid multiple appointments due to distance. Patient was able to agree to an appointment on 9/22. Will renew SPAP today with financial counselor over phone. [place]

## 2018-10-11 ENCOUNTER — Other Ambulatory Visit (HOSPITAL_COMMUNITY): Payer: Self-pay | Admitting: Psychiatry

## 2018-10-11 DIAGNOSIS — F411 Generalized anxiety disorder: Secondary | ICD-10-CM

## 2018-10-11 DIAGNOSIS — F319 Bipolar disorder, unspecified: Secondary | ICD-10-CM

## 2018-10-16 ENCOUNTER — Other Ambulatory Visit: Payer: Self-pay

## 2018-10-16 ENCOUNTER — Ambulatory Visit (INDEPENDENT_AMBULATORY_CARE_PROVIDER_SITE_OTHER): Payer: Medicare Other | Admitting: Psychiatry

## 2018-10-16 ENCOUNTER — Encounter (HOSPITAL_COMMUNITY): Payer: Self-pay | Admitting: Psychiatry

## 2018-10-16 DIAGNOSIS — F411 Generalized anxiety disorder: Secondary | ICD-10-CM

## 2018-10-16 DIAGNOSIS — F319 Bipolar disorder, unspecified: Secondary | ICD-10-CM | POA: Diagnosis not present

## 2018-10-16 MED ORDER — BUPROPION HCL ER (XL) 300 MG PO TB24: ORAL_TABLET | ORAL | 0 refills | Status: DC

## 2018-10-16 MED ORDER — LAMOTRIGINE 100 MG PO TABS: 100.0000 mg | ORAL_TABLET | Freq: Every day | ORAL | 0 refills | Status: DC

## 2018-10-16 MED ORDER — CLONAZEPAM 1 MG PO TABS: 1.0000 mg | ORAL_TABLET | Freq: Every day | ORAL | 0 refills | Status: DC

## 2018-10-16 NOTE — Progress Notes (Signed)
Virtual Visit via Telephone Note  I connected with Alexis Welch on 10/16/18 at 10:40 AM EDT by telephone and verified that I am speaking with the correct person using two identifiers.   I discussed the limitations, risks, security and privacy concerns of performing an evaluation and management service by telephone and the availability of in person appointments. I also discussed with the patient that there may be a patient responsible charge related to this service. The patient expressed understanding and agreed to proceed.   History of Present Illness: Patient was evaluated by phone session.  He is pretty stable on his current medication.  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  Patient is doing remodeling.  He has been very busy also feel more motivated to do things.  He endorsed losing 5 to 6 pounds in past few months.  He denies any anxiety or any depression.  He feels the current medicine is working.  He is sleeping good.  He has no rash, itching, tremors or shakes.  Denies any highs or lows, mania, impulsivity.  He is not drinking or using any illegal substances.  He denies any major panic attack.  He like to continue on his current medication.   Past Psychiatric History:Reviewed. H/Oanxiety and bipolar disorder. No h/oinpatient or any suicidal attempt. Tried Celexa, Trileptal, Lamictal, Lexapro,ZyprexaandDepakote. Depakote and Lexapro caused diarrhea.   Psychiatric Specialty Exam: Physical Exam  ROS  There were no vitals taken for this visit.There is no height or weight on file to calculate BMI.  General Appearance: NA  Eye Contact:  NA  Speech:  Clear and Coherent  Volume:  Normal  Mood:  Euthymic  Affect:  NA  Thought Process:  Goal Directed  Orientation:  Full (Time, Place, and Person)  Thought Content:  WDL and Logical  Suicidal Thoughts:  No  Homicidal Thoughts:  No  Memory:  Immediate;   Good Recent;    Good Remote;   Good  Judgement:  Good  Insight:  Good  Psychomotor Activity:  Normal  Concentration:  Concentration: Good and Attention Span: Good  Recall:  Good  Fund of Knowledge:  Good  Language:  Good  Akathisia:  No  Handed:  Right  AIMS (if indicated):     Assets:  Communication Skills Desire for Improvement Housing Resilience Talents/Skills  ADL's:  Intact  Cognition:  WNL  Sleep:   ok      Assessment and Plan: Bipolar disorder type I.  Generalized anxiety disorder.  Patient doing very well and stable on his current medication.  He has no rash, itching tremors or any shakes.  Continue Wellbutrin XL 300 mg daily, Klonopin 1 mg at bedtime and Lamictal 100 mg daily.  Discussed medication side effects and benefits specially benzodiazepine tolerance, withdrawal and dependency.  Recommended to call us back if is any question or any concern.  Follow-up in 3 months.  Follow Up Instructions:    I discussed the assessment and treatment plan with the patient. The patient was provided an opportunity to ask questions and all were answered. The patient agreed with the plan and demonstrated an understanding of the instructions.   The patient was advised to call back or seek an in-person evaluation if the symptoms worsen or if the condition fails to improve as anticipated.  I provided 15 minutes of non-face-to-face time during this encounter.   Kathlee Nations, MD  There.

## 2018-11-05 ENCOUNTER — Ambulatory Visit: Payer: Medicare Other | Admitting: Infectious Disease

## 2018-11-05 ENCOUNTER — Encounter: Payer: Self-pay | Admitting: Infectious Disease

## 2018-11-05 ENCOUNTER — Other Ambulatory Visit: Payer: Self-pay | Admitting: Infectious Disease

## 2018-11-05 ENCOUNTER — Other Ambulatory Visit (HOSPITAL_COMMUNITY)
Admission: RE | Admit: 2018-11-05 | Discharge: 2018-11-05 | Disposition: A | Discharge status: Home / Self Care | Payer: Medicare Other | Source: Ambulatory Visit | Attending: Infectious Disease | Admitting: Infectious Disease

## 2018-11-05 ENCOUNTER — Other Ambulatory Visit: Payer: Self-pay

## 2018-11-05 VITALS — BP 157/75 | HR 64 | Temp 97.7°F

## 2018-11-05 DIAGNOSIS — B20 Human immunodeficiency virus [HIV] disease: Secondary | ICD-10-CM | POA: Insufficient documentation

## 2018-11-05 DIAGNOSIS — Z21 Asymptomatic human immunodeficiency virus [HIV] infection status: Secondary | ICD-10-CM

## 2018-11-05 DIAGNOSIS — Z79899 Other long term (current) drug therapy: Secondary | ICD-10-CM | POA: Insufficient documentation

## 2018-11-05 DIAGNOSIS — Z113 Encounter for screening for infections with a predominantly sexual mode of transmission: Secondary | ICD-10-CM

## 2018-11-05 DIAGNOSIS — F319 Bipolar disorder, unspecified: Secondary | ICD-10-CM

## 2018-11-05 DIAGNOSIS — R002 Palpitations: Secondary | ICD-10-CM

## 2018-11-05 DIAGNOSIS — I10 Essential (primary) hypertension: Secondary | ICD-10-CM

## 2018-11-05 DIAGNOSIS — E782 Mixed hyperlipidemia: Secondary | ICD-10-CM

## 2018-11-05 DIAGNOSIS — I491 Atrial premature depolarization: Secondary | ICD-10-CM

## 2018-11-05 MED ORDER — BIKTARVY 50-200-25 MG PO TABS: 1.0000 | ORAL_TABLET | Freq: Every day | ORAL | 11 refills | Status: DC

## 2018-11-05 NOTE — Progress Notes (Signed)
Chief complaint: Palpitations and lightheadedness and dizziness    Subjective:    Patient ID: Alexis Welch, male    DOB: 05-Jun-1960, 58 y.o.   MRN: [id number]  HPI  Alexis Welch is a 58 y.o. male who is doing superbly well on BIKTARVY. He is under the care of [name] for his psychiatric conditions.  When I last saw him Alexis Welch was having palpitations and dizziness.  I sent him to the emergency room where he was seen in observed and eventually discharged to home.  He is being followed by cardiology of added metoprolol.  He still has occasional episodes of palpitations but largely is feeling much much better.  He was having trouble with his Biktarvy not being filled at the [facility] due to confusion with his name and another patient's name in the clinic he was actually on different medications.  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       Past Medical History:  Diagnosis Date  . Anxiety   . Arthritis   . Bipolar I disorder, most recent episode depressed ([place]) 11/23/2014  . Depression   . Dizzy 03/15/2018  . Hemangioma 04/28/2015  . HIV disease ([place])   . Hypertension   . PAC (premature atrial contraction) 03/15/2018  . Testicular pain 04/13/2016  . Tremor 09/07/2015  . 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  Family History  Problem Relation Age of Onset  . Cancer Father        colon cancer  . Alcohol abuse Father   . Dementia Father   . Heart disease Maternal Uncle   . Alcohol abuse Maternal Uncle   . Depression Mother   . Physical abuse Mother   . Bipolar disorder Sister   . Schizophrenia Sister   . OCD Sister   . Alcohol abuse Brother   . Drug abuse Brother   . Seizures Brother   . Alcohol abuse Maternal Aunt   . Depression Maternal Aunt   . Alcohol abuse Paternal Uncle   . Depression Paternal Uncle       Social History    Socioeconomic History  . Marital status: Single    Spouse name: Not on file  . Number of children: Not on file  . Years of education: Not on file  . Highest education level: Not on file  Occupational History  . Not on file  Social Needs  . Financial resource strain: Not on file  . Food insecurity    Worry: Not on file    Inability: Not on file  . Transportation needs    Medical: Not on file    Non-medical: Not on file  Tobacco Use  . Smoking status: Never Smoker  . Smokeless tobacco: Never Used  Substance and Sexual Activity  . Alcohol use: No    Alcohol/week: 0.0 standard drinks  . Drug use: Yes    Frequency: 3.0 times per week    Types: Marijuana  . Sexual activity: Not Currently    Partners: Male    Birth control/protection: Condom    Comment: declined condoms  Lifestyle  . Physical activity    Days per week: Not on file    Minutes per session: Not on file  . Stress: Not on file  Relationships  . Social connections    Talks on phone: Not on file  Gets together: Not on file    Attends religious service: Not on file    Active member of club or organization: Not on file    Attends meetings of clubs or organizations: Not on file    Relationship status: Not on file  Other Topics Concern  . Not on file  Social History Narrative  . Not on file    No Known Allergies   Current Outpatient Medications:  .  amLODipine (NORVASC) 10 MG tablet, TAKE 1 TABLET BY MOUTH DAILY, Disp: 90 tablet, Rfl: 0 .  atorvastatin (LIPITOR) 20 MG tablet, TAKE 1 TABLET BY MOUTH DAILY, Disp: 90 tablet, Rfl: 1 .  BIKTARVY 50-200-25 MG TABS tablet, TAKE 1 TABLET BY MOUTH DAILY, Disp: 30 tablet, Rfl: 5 .  buPROPion (WELLBUTRIN XL) 300 MG 24 hr tablet, TAKE 1 TABLET BY MOUTH DAILY, Disp: 90 tablet, Rfl: 0 .  clonazePAM (KLONOPIN) 1 MG tablet, Take 1 tablet (1 mg total) by mouth at bedtime., Disp: 90 tablet, Rfl: 0 .  ibuprofen (ADVIL) 800 MG tablet, Take by mouth., Disp: , Rfl:  .   lamoTRIgine (LAMICTAL) 100 MG tablet, Take 1 tablet (100 mg total) by mouth daily., Disp: 90 tablet, Rfl: 0 .  loratadine (CLARITIN) 10 MG tablet, Take 10 mg by mouth as needed for allergies., Disp: , Rfl:  .  metoprolol tartrate (LOPRESSOR) 25 MG tablet, Take 12.5 mg by mouth 2 (two) times daily., Disp: , Rfl:  .  ondansetron (ZOFRAN) 4 MG tablet, TAKE 1 TABLET(4 MG) BY MOUTH THREE TIMES DAILY AS NEEDED FOR NAUSEA, Disp: 60 tablet, Rfl: 0 .  triamcinolone ointment (KENALOG) 0.5 %, Apply topically., Disp: , Rfl:    .Review of Systems  Constitutional: Negative for activity change, appetite change, chills, diaphoresis, fatigue, fever and unexpected weight change.  HENT: Negative for congestion, nosebleeds, postnasal drip, rhinorrhea, sinus pressure, sneezing, sore throat and trouble swallowing.   Eyes: Negative for photophobia and visual disturbance.  Respiratory: Negative for cough, chest tightness, shortness of breath, wheezing and stridor.   Cardiovascular: Positive for palpitations. Negative for chest pain and leg swelling.  Gastrointestinal: Negative for abdominal distention, abdominal pain, anal bleeding, blood in stool, constipation, diarrhea and vomiting.  Genitourinary: Negative for difficulty urinating, dysuria, flank pain and hematuria.  Musculoskeletal: Negative for back pain, gait problem, joint swelling and myalgias.  Skin: Negative for color change, pallor and wound.  Neurological: Positive for dizziness and tremors. Negative for weakness and light-headedness.  Hematological: Negative for adenopathy. Does not bruise/bleed easily.  Psychiatric/Behavioral: Negative for agitation, confusion, decreased concentration and sleep disturbance. The patient is nervous/anxious.        Objective:   Physical Exam  Constitutional: He is oriented to person, place, and time. He appears well-developed and well-nourished. No distress.  HENT:  Head: Normocephalic and atraumatic.  Mouth/Throat:  Oropharynx is clear and moist. No oropharyngeal exudate.  Eyes: Pupils are equal, round, and reactive to light. Conjunctivae and EOM are normal. No scleral icterus.  Neck: Normal range of motion. Neck supple. No JVD present.  Cardiovascular: An irregular rhythm present. Tachycardia present. Exam reveals friction rub. Exam reveals no gallop.  No murmur heard. Pulmonary/Chest: Effort normal and breath sounds normal. No respiratory distress. He has no wheezes. He has no rales. He exhibits no tenderness.  Abdominal: Soft. He exhibits no distension.  Musculoskeletal:        General: No tenderness or edema.  Lymphadenopathy:    He has no cervical adenopathy.  Neurological: He is alert  and oriented to person, place, and time. He displays tremor. He exhibits normal muscle tone. Coordination normal.  Skin: Skin is warm and dry. He is not diaphoretic. No erythema. There is pallor.  Psychiatric: His behavior is normal. Judgment and thought content normal. His mood appears anxious.  Nursing note and vitals reviewed.         Assessment & Plan:    [id number] with BIKTARVY, HMA P is renewed he is to do that again this winter but can come back and see me in 1 years time.  Palpitations: These still occur at times but much better after he has been on the beta-blocker will be as followed by cardiology  CV risk: continue lipitor 20mg ,   Hyperlipidemia: see above  Bipolar, Depression: continue to see  [name] and [facility] counselors.  HTN: continue  Norvasc., metoprolol  There were no vitals filed for this visit. PTSD: seeing 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

## 2018-11-06 LAB — URINE CYTOLOGY ANCILLARY ONLY
Chlamydia: NEGATIVE
Neisseria Gonorrhea: NEGATIVE

## 2018-11-07 LAB — HELPER T-LYMPH-CD4 (ARMC ONLY)
% CD 4 Pos. Lymph.: 33.8 % (ref 30.8–58.5)
Absolute CD 4 Helper: 541 /uL (ref 359–1519)
Basophils Absolute: 0 10*3/uL (ref 0.0–0.2)
Basos: 0 %
EOS (ABSOLUTE): 0 10*3/uL (ref 0.0–0.4)
Eos: 1 %
Hematocrit: 46.1 % (ref 37.5–51.0)
Hemoglobin: 15.5 g/dL (ref 13.0–17.7)
Immature Grans (Abs): 0 10*3/uL (ref 0.0–0.1)
Immature Granulocytes: 0 %
Lymphocytes Absolute: 1.6 10*3/uL (ref 0.7–3.1)
Lymphs: 26 %
MCH: 31.9 pg (ref 26.6–33.0)
MCHC: 33.6 g/dL (ref 31.5–35.7)
MCV: 95 fL (ref 79–97)
Monocytes Absolute: 0.5 10*3/uL (ref 0.1–0.9)
Monocytes: 8 %
Neutrophils Absolute: 4 10*3/uL (ref 1.4–7.0)
Neutrophils: 65 %
Platelets: 152 10*3/uL (ref 150–450)
RBC: 4.86 x10E6/uL (ref 4.14–5.80)
RDW: 13.1 % (ref 11.6–15.4)
WBC: 6.2 10*3/uL (ref 3.4–10.8)

## 2018-11-08 LAB — HIV-1 RNA QUANT-NO REFLEX-BLD
HIV 1 RNA Quant: 66 copies/mL — ABNORMAL HIGH
HIV-1 RNA Quant, Log: 1.82 Log copies/mL — ABNORMAL HIGH

## 2018-11-08 LAB — CBC WITH DIFFERENTIAL/PLATELET
Absolute Monocytes: 498 cells/uL (ref 200–950)
Basophils Absolute: 19 cells/uL (ref 0–200)
Basophils Relative: 0.3 %
Eosinophils Absolute: 38 cells/uL (ref 15–500)
Eosinophils Relative: 0.6 %
HCT: 44.8 % (ref 38.5–50.0)
Hemoglobin: 15.5 g/dL (ref 13.2–17.1)
Lymphs Abs: 1657 cells/uL (ref 850–3900)
MCH: 31.6 pg (ref 27.0–33.0)
MCHC: 34.6 g/dL (ref 32.0–36.0)
MCV: 91.4 fL (ref 80.0–100.0)
MPV: 11.4 fL (ref 7.5–12.5)
Monocytes Relative: 7.9 %
Neutro Abs: 4089 cells/uL (ref 1500–7800)
Neutrophils Relative %: 64.9 %
Platelets: 176 10*3/uL (ref 140–400)
RBC: 4.9 10*6/uL (ref 4.20–5.80)
RDW: 12.8 % (ref 11.0–15.0)
Total Lymphocyte: 26.3 %
WBC: 6.3 10*3/uL (ref 3.8–10.8)

## 2018-11-08 LAB — COMPLETE METABOLIC PANEL WITH GFR
AG Ratio: 2 (calc) (ref 1.0–2.5)
ALT: 13 U/L (ref 9–46)
AST: 14 U/L (ref 10–35)
Albumin: 4.5 g/dL (ref 3.6–5.1)
Alkaline phosphatase (APISO): 82 U/L (ref 35–144)
BUN: 16 mg/dL (ref 7–25)
CO2: 26 mmol/L (ref 20–32)
Calcium: 9.2 mg/dL (ref 8.6–10.3)
Chloride: 107 mmol/L (ref 98–110)
Creat: 1.06 mg/dL (ref 0.70–1.25)
GFR, Est African American: 88 mL/min/{1.73_m2} (ref >=60)
GFR, Est Non African American: 76 mL/min/{1.73_m2} (ref >=60)
Globulin: 2.3 g/dL (calc) (ref 1.9–3.7)
Glucose, Bld: 78 mg/dL (ref 65–99)
Potassium: 3.7 mmol/L (ref 3.5–5.3)
Sodium: 144 mmol/L (ref 135–146)
Total Bilirubin: 0.9 mg/dL (ref 0.2–1.2)
Total Protein: 6.8 g/dL (ref 6.1–8.1)

## 2018-11-08 LAB — LIPID PANEL
Cholesterol: 135 mg/dL (ref <200)
HDL: 55 mg/dL (ref >=40)
LDL Cholesterol (Calc): 65 mg/dL (calc)
Non-HDL Cholesterol (Calc): 80 mg/dL (calc) (ref <130)
Total CHOL/HDL Ratio: 2.5 (calc) (ref <5.0)
Triglycerides: 70 mg/dL (ref <150)

## 2018-11-08 LAB — RPR: RPR Ser Ql: NONREACTIVE

## 2018-12-04 ENCOUNTER — Ambulatory Visit: Payer: Medicare Other

## 2018-12-04 ENCOUNTER — Other Ambulatory Visit: Payer: Self-pay

## 2018-12-29 ENCOUNTER — Other Ambulatory Visit: Payer: Self-pay | Admitting: Infectious Disease

## 2018-12-29 DIAGNOSIS — B20 Human immunodeficiency virus [HIV] disease: Secondary | ICD-10-CM

## 2019-01-05 ENCOUNTER — Other Ambulatory Visit: Payer: Self-pay | Admitting: Infectious Disease

## 2019-01-05 DIAGNOSIS — I10 Essential (primary) hypertension: Secondary | ICD-10-CM

## 2019-01-15 ENCOUNTER — Ambulatory Visit (INDEPENDENT_AMBULATORY_CARE_PROVIDER_SITE_OTHER): Payer: Medicare Other | Admitting: Psychiatry

## 2019-01-15 ENCOUNTER — Encounter (HOSPITAL_COMMUNITY): Payer: Self-pay | Admitting: Psychiatry

## 2019-01-15 ENCOUNTER — Other Ambulatory Visit: Payer: Self-pay

## 2019-01-15 DIAGNOSIS — F411 Generalized anxiety disorder: Secondary | ICD-10-CM | POA: Diagnosis not present

## 2019-01-15 DIAGNOSIS — F319 Bipolar disorder, unspecified: Secondary | ICD-10-CM

## 2019-01-15 MED ORDER — LAMOTRIGINE 100 MG PO TABS: 100.0000 mg | ORAL_TABLET | Freq: Every day | ORAL | 0 refills | Status: DC

## 2019-01-15 MED ORDER — CLONAZEPAM 1 MG PO TABS: 1.0000 mg | ORAL_TABLET | Freq: Every day | ORAL | 0 refills | Status: DC

## 2019-01-15 MED ORDER — BUPROPION HCL ER (XL) 300 MG PO TB24: ORAL_TABLET | ORAL | 0 refills | Status: DC

## 2019-01-15 NOTE — Progress Notes (Signed)
Virtual Visit via Telephone Note  I connected with Alexis Welch on 01/15/19 at 11:00 AM EST by telephone and verified that I am speaking with the correct person using two identifiers.   I discussed the limitations, risks, security and privacy concerns of performing an evaluation and management service by telephone and the availability of in person appointments. I also discussed with the patient that there may be a patient responsible charge related to this service. The patient expressed understanding and agreed to proceed.   History of Present Illness: Patient was evaluated by phone session.  He admitted recently had palpitation and he went to see his physician but everything was normal.  He had blood work and is happy that his cholesterol and chemistry is within normal range.  He is not sure what causes that palpitation but sometimes he thinks about his own home.  He has been trying to buy home but prices are very high and he could not afford.  He was fixing and remodeling his needs house which is almost gone and now he wanted to have his own house.  However he does not feel he need to change medication or see a therapist for counseling.  He feels the current medicine is working.  He is sleeping good.  He denies any crying spells or any feeling of hopelessness or worthlessness.  Denies any mania, psychosis or any hallucination.  Denies drinking or using any illegal substances.  Energy level is good.  His appetite is okay.   Past Psychiatric History:Reviewed. H/Oanxiety and bipolar disorder. No h/oinpatient or any suicidal attempt. Tried Celexa, Trileptal, Lamictal, Lexapro,ZyprexaandDepakote. Depakote and Lexapro caused diarrhea.   Recent Results (from the past 2160 hour(s))  Urine cytology ancillary only     Status: None   Collection Time: 11/05/18 12:00 AM  Result Value Ref Range   Chlamydia Negative     Comment: Normal Reference Range - Negative   Neisseria Gonorrhea Negative      Comment: Normal Reference Range - Negative  Helper T-Lymph-CD4 ([facility] only)     Status: None   Collection Time: 11/05/18 11:06 AM  Result Value Ref Range   Absolute CD 4 Helper 541 359 - 1,519 /uL   % CD 4 Pos. Lymph. 33.8 30.8 - 58.5 %   WBC 6.2 3.4 - 10.8 x10E3/uL   RBC 4.86 4.14 - 5.80 x10E6/uL   Hematocrit 46.1 37.5 - 51.0 %   MCV 95 79 - 97 fL   MCH 31.9 26.6 - 33.0 pg   MCHC 33.6 31.5 - 35.7 g/dL   RDW 13.1 11.6 - 15.4 %   Platelets 152 150 - 450 x10E3/uL   Neutrophils 65 Not Estab. %   Lymphs 26 Not Estab. %   Monocytes 8 Not Estab. %   Eos 1 Not Estab. %   Basos 0 Not Estab. %   Neutrophils Absolute 4.0 1.4 - 7.0 x10E3/uL   Lymphocytes Absolute 1.6 0.7 - 3.1 x10E3/uL   Monocytes Absolute 0.5 0.1 - 0.9 x10E3/uL   EOS (ABSOLUTE) 0.0 0.0 - 0.4 x10E3/uL   Basophils Absolute 0.0 0.0 - 0.2 x10E3/uL   Immature Granulocytes 0 Not Estab. %   Immature Grans (Abs) 0.0 0.0 - 0.1 x10E3/uL    Comment: (NOTE) Performed At: [facility] [place], [place] [id number] Rush Farmer MD [id number]    Hemoglobin 15.5 13.0 - 17.7 g/dL  HIV-1 RNA quant-no reflex-bld     Status: Abnormal   Collection Time: 11/05/18 11:31 AM  Result Value Ref Range   HIV 1 RNA Quant 66 (H) NOT DETECT copies/mL   HIV-1 RNA Quant, Log 1.82 (H) NOT DETECT Log copies/mL    Comment: . This test was performed using Real-Time Polymerase Chain Reaction. . Reportable Range: 20 copies/mL to 10,000,000 copies/mL (1.30 log copies/mL to 7.00 log copies/mL).   RPR     Status: None   Collection Time: 11/05/18 11:31 AM  Result Value Ref Range   RPR Ser Ql NON-REACTIVE NON-REACTI  COMPLETE METABOLIC PANEL WITH GFR     Status: None   Collection Time: 11/05/18 11:31 AM  Result Value Ref Range   Glucose, Bld 78 65 - 99 mg/dL    Comment: .            Fasting reference interval .    BUN 16 7 - 25 mg/dL   Creat 1.06 0.70 - 1.25 mg/dL    Comment: For patients >51 years of age, the  reference limit for Creatinine is approximately 13% higher for people identified as African-American. .    GFR, Est Non African American 76 > OR = 60 mL/min/1.64m2   GFR, Est African American 88 > OR = 60 mL/min/1.56m2   BUN/Creatinine Ratio NOT APPLICABLE 6 - 22 (calc)   Sodium 144 135 - 146 mmol/L   Potassium 3.7 3.5 - 5.3 mmol/L   Chloride 107 98 - 110 mmol/L   CO2 26 20 - 32 mmol/L   Calcium 9.2 8.6 - 10.3 mg/dL   Total Protein 6.8 6.1 - 8.1 g/dL   Albumin 4.5 3.6 - 5.1 g/dL   Globulin 2.3 1.9 - 3.7 g/dL (calc)   AG Ratio 2.0 1.0 - 2.5 (calc)   Total Bilirubin 0.9 0.2 - 1.2 mg/dL   Alkaline phosphatase (APISO) 82 35 - 144 U/L   AST 14 10 - 35 U/L   ALT 13 9 - 46 U/L  CBC with Differential/Platelet     Status: None   Collection Time: 11/05/18 11:31 AM  Result Value Ref Range   WBC 6.3 3.8 - 10.8 Thousand/uL   RBC 4.90 4.20 - 5.80 Million/uL   Hemoglobin 15.5 13.2 - 17.1 g/dL   HCT 44.8 38.5 - 50.0 %   MCV 91.4 80.0 - 100.0 fL   MCH 31.6 27.0 - 33.0 pg   MCHC 34.6 32.0 - 36.0 g/dL   RDW 12.8 11.0 - 15.0 %   Platelets 176 140 - 400 Thousand/uL   MPV 11.4 7.5 - 12.5 fL   Neutro Abs 4,089 1,500 - 7,800 cells/uL   Lymphs Abs 1,657 850 - 3,900 cells/uL   Absolute Monocytes 498 200 - 950 cells/uL   Eosinophils Absolute 38 15 - 500 cells/uL   Basophils Absolute 19 0 - 200 cells/uL   Neutrophils Relative % 64.9 %   Total Lymphocyte 26.3 %   Monocytes Relative 7.9 %   Eosinophils Relative 0.6 %   Basophils Relative 0.3 %  Lipid panel     Status: None   Collection Time: 11/05/18 11:31 AM  Result Value Ref Range   Cholesterol 135 <200 mg/dL   HDL 55 > OR = 40 mg/dL   Triglycerides 70 <150 mg/dL   LDL Cholesterol (Calc) 65 mg/dL (calc)    Comment: Reference range: <100 . Desirable range <100 mg/dL for primary prevention;   <70 mg/dL for patients with CHD or diabetic patients  with > or = 2 CHD risk factors. [name] LDL-C is now calculated using the Martin-Hopkins   calculation,  which is a validated novel method providing  better accuracy than the Friedewald equation in the  estimation of LDL-C.  Cresenciano Genre et al. Annamaria Helling. [id number]): [id number]  (http://education.[facility].com/faq/FAQ164)    Total CHOL/HDL Ratio 2.5 <5.0 (calc)   Non-HDL Cholesterol (Calc) 80 <130 mg/dL (calc)    Comment: For patients with diabetes plus 1 major ASCVD risk  factor, treating to a non-HDL-C goal of <100 mg/dL  (LDL-C of <70 mg/dL) is considered a therapeutic  option.        Psychiatric Specialty Exam: Physical Exam  ROS  There were no vitals taken for this visit.There is no height or weight on file to calculate BMI.  General Appearance: NA  Eye Contact:  NA  Speech:  Clear and Coherent  Volume:  Normal  Mood:  Anxious  Affect:  NA  Thought Process:  Goal Directed  Orientation:  Full (Time, Place, and Person)  Thought Content:  WDL  Suicidal Thoughts:  No  Homicidal Thoughts:  No  Memory:  Immediate;   Good Recent;   Good Remote;   Good  Judgement:  Good  Insight:  Good  Psychomotor Activity:  NA  Concentration:  Concentration: Good and Attention Span: Good  Recall:  Good  Fund of Knowledge:  Good  Language:  Good  Akathisia:  No  Handed:  Right  AIMS (if indicated):     Assets:  Communication Skills Desire for Improvement Financial Resources/Insurance Housing Resilience  ADL's:  Intact  Cognition:  WNL  Sleep:   good      Assessment and Plan: Bipolar disorder type I.  Generalized anxiety disorder.  I reviewed blood work results.  Reassurance given.  He does not feel that he need to change the medication or see any therapist.  Continue Wellbutrin XL 300 mg daily, Klonopin 1 mg at bedtime and Lamictal 100 mg daily.  He has no rash, itching, tremors or shakes.  Discussed medication side effects and benefits.  Recommended to call us back if is any question or any concern.  Follow-up in 3 months.  Follow Up Instructions:    I  discussed the assessment and treatment plan with the patient. The patient was provided an opportunity to ask questions and all were answered. The patient agreed with the plan and demonstrated an understanding of the instructions.   The patient was advised to call back or seek an in-person evaluation if the symptoms worsen or if the condition fails to improve as anticipated.  I provided 20 minutes of non-face-to-face time during this encounter.   Kathlee Nations, MD

## 2019-02-18 ENCOUNTER — Ambulatory Visit: Payer: Medicare Other

## 2019-02-18 ENCOUNTER — Other Ambulatory Visit: Payer: Self-pay

## 2019-04-02 ENCOUNTER — Other Ambulatory Visit: Payer: Self-pay | Admitting: Infectious Disease

## 2019-04-02 DIAGNOSIS — E785 Hyperlipidemia, unspecified: Secondary | ICD-10-CM

## 2019-04-15 ENCOUNTER — Ambulatory Visit (INDEPENDENT_AMBULATORY_CARE_PROVIDER_SITE_OTHER): Payer: Medicare Other | Admitting: Psychiatry

## 2019-04-15 ENCOUNTER — Other Ambulatory Visit: Payer: Self-pay

## 2019-04-15 ENCOUNTER — Encounter (HOSPITAL_COMMUNITY): Payer: Self-pay | Admitting: Psychiatry

## 2019-04-15 DIAGNOSIS — F411 Generalized anxiety disorder: Secondary | ICD-10-CM

## 2019-04-15 DIAGNOSIS — F319 Bipolar disorder, unspecified: Secondary | ICD-10-CM

## 2019-04-15 MED ORDER — LAMOTRIGINE 150 MG PO TABS: 150.0000 mg | ORAL_TABLET | Freq: Every day | ORAL | 0 refills | Status: DC

## 2019-04-15 MED ORDER — CLONAZEPAM 1 MG PO TABS: 1.0000 mg | ORAL_TABLET | Freq: Every day | ORAL | 0 refills | Status: DC

## 2019-04-15 MED ORDER — BUPROPION HCL ER (XL) 300 MG PO TB24: ORAL_TABLET | ORAL | 0 refills | Status: DC

## 2019-04-15 NOTE — Progress Notes (Signed)
Virtual Visit via Telephone Note  I connected with Alexis Welch on 04/15/19 at 10:40 AM EST by telephone and verified that I am speaking with the correct person using two identifiers.   I discussed the limitations, risks, security and privacy concerns of performing an evaluation and management service by telephone and the availability of in person appointments. I also discussed with the patient that there may be a patient responsible charge related to this service. The patient expressed understanding and agreed to proceed.   History of Present Illness: Patient was evaluated by phone session.  He appears emotional on the phone and he admitted that lately he has been more irritable and sometimes gets frustrated for no reason.  Later he told that a lot of things going on in his life.  He was living in a house with his friend who now decided to sell the house and he is not sure if he wants to buy the house given his age and long-term loan commitment.  He was also very upset because a 61 year old who had bipolar disorder and patient has a power of attorney was recently admitted to behavioral health center.  Patient told that he had been very frustrated with the care she was given because he believes it was an out of bipolar episode but his sisters medicines were changed and they did not do further medical work-up.  Patient was readmitted at [facility] and found to have a cardiac arrest.  Patient did talk to supervisor and now he is pleased that there is an investigation going on.  Patient told that there are nights when he is sleeping too much.  He is in a process of renovating his knees house.  Despite getting tired some time he has racing thoughts.  Denies any mania, psychosis, hallucination or any suicidal thoughts.  But admitted more irritable and emotional.  His energy level is okay.  His appetite is okay.  He denies any weight change.  He is compliant with Klonopin, Lamictal and  Wellbutrin.  He has no rash or itching.  Past Psychiatric History:Reviewed. H/Oanxiety and bipolar disorder. No h/oinpatient or any suicidal attempt. Tried Celexa, Trileptal, Lamictal, Lexapro,ZyprexaandDepakote. Depakote and Lexapro caused diarrhea.     Psychiatric Specialty Exam: Physical Exam  Review of Systems  There were no vitals taken for this visit.There is no height or weight on file to calculate BMI.  General Appearance: NA  Eye Contact:  NA  Speech:  Clear and Coherent  Volume:  Normal  Mood:  Anxious and Irritable  Affect:  NA  Thought Process:  Descriptions of Associations: Circumstantial  Orientation:  Full (Time, Place, and Person)  Thought Content:  Rumination  Suicidal Thoughts:  No  Homicidal Thoughts:  No  Memory:  Immediate;   Good Recent;   Fair Remote;   Fair  Judgement:  Good  Insight:  Good  Psychomotor Activity:  NA  Concentration:  Concentration: Fair and Attention Span: Fair  Recall:  Good  Fund of Knowledge:  Good  Language:  Good  Akathisia:  No  Handed:  Right  AIMS (if indicated):     Assets:  Communication Skills Desire for Improvement Financial Resources/Insurance Resilience  ADL's:  Intact  Cognition:  WNL  Sleep:   too much      Assessment and Plan: Bipolar disorder type I.  Generalized anxiety disorder.  Discusses psychosocial issues and lately noticed more irritable and frustrated.  I recommend to try lamotrigine 150 mg daily to  help his mood lability and frustration.  He agreed with the plan.  He will continue Klonopin 1 mg at bedtime and Wellbutrin XL 300 mg daily.  Reassurance given.  Patient is not interested in therapy at this time.  He has no rash or any itching.  I recommend if symptoms do not improve with increase Lamictal then he should call us back.  Follow-up in 3 months.  Discussed safety concerns that anytime having active suicidal thoughts or homicidal thoughts and he need to call 911 or go to local  emergency room.  Follow Up Instructions:    I discussed the assessment and treatment plan with the patient. The patient was provided an opportunity to ask questions and all were answered. The patient agreed with the plan and demonstrated an understanding of the instructions.   The patient was advised to call back or seek an in-person evaluation if the symptoms worsen or if the condition fails to improve as anticipated.  I provided 20 minutes of non-face-to-face time during this encounter.   Kathlee Nations, MD

## 2019-06-23 ENCOUNTER — Encounter: Payer: Self-pay | Admitting: Infectious Disease

## 2019-07-10 ENCOUNTER — Encounter (HOSPITAL_COMMUNITY): Payer: Self-pay | Admitting: Psychiatry

## 2019-07-10 ENCOUNTER — Other Ambulatory Visit: Payer: Self-pay

## 2019-07-10 ENCOUNTER — Telehealth (INDEPENDENT_AMBULATORY_CARE_PROVIDER_SITE_OTHER): Payer: Medicare Other | Admitting: Psychiatry

## 2019-07-10 DIAGNOSIS — F411 Generalized anxiety disorder: Secondary | ICD-10-CM

## 2019-07-10 DIAGNOSIS — F319 Bipolar disorder, unspecified: Secondary | ICD-10-CM | POA: Diagnosis not present

## 2019-07-10 MED ORDER — BUPROPION HCL ER (XL) 300 MG PO TB24: ORAL_TABLET | ORAL | 0 refills | Status: DC

## 2019-07-10 MED ORDER — CLONAZEPAM 1 MG PO TABS: 1.0000 mg | ORAL_TABLET | Freq: Every day | ORAL | 0 refills | Status: DC

## 2019-07-10 MED ORDER — QUETIAPINE FUMARATE 50 MG PO TABS: 50.0000 mg | ORAL_TABLET | Freq: Every day | ORAL | 0 refills | Status: DC

## 2019-07-10 MED ORDER — LAMOTRIGINE 150 MG PO TABS: 150.0000 mg | ORAL_TABLET | Freq: Every day | ORAL | 0 refills | Status: DC

## 2019-07-10 NOTE — Addendum Note (Signed)
Addended by: Berniece Andreas T on: 07/10/2019 04:14 PM   Modules accepted: Orders

## 2019-07-10 NOTE — Progress Notes (Signed)
Virtual Visit via Telephone Note  I connected with Alexis Welch on 07/10/19 at  2:00 PM EDT by telephone and verified that I am speaking with the correct person using two identifiers.   I discussed the limitations, risks, security and privacy concerns of performing an evaluation and management service by telephone and the availability of in person appointments. I also discussed with the patient that there may be a patient responsible charge related to this service. The patient expressed understanding and agreed to proceed.  Patient Location: Home Provider Location: Home Office  History of Present Illness: Patient is evaluated by phone session.  He admitted lately more emotional, frustrated and irritable because of his sister's situation.  Patient sister has bipolar disorder and she has been not doing well.  Patient has a power of attorney for her.  Lately patient noticed that she is disrespectful and he feels sometimes mentally exhausted to deal with her.  Patient also noticed lately more anxious, depressed and irritable.  He had trouble sleeping and people waking up in the middle of the night.  He admitted lately lack of interest but denies any active suicidal thoughts or homicidal thoughts.  We have increased Lamictal 150 on the last visit and he did notice some improvement.  He also recommended to see a therapist but at that time he was not interested however now he feel he might need to see a therapist to better cope his symptoms.  Sometimes he admitted to talk to himself to calm him down.  His appetite is okay.  His energy level is okay.  He denies any mania, psychosis, hallucination.  He has no rash, itching tremors or shakes.   Past Psychiatric History:Reviewed. H/Oanxiety and bipolar disorder. No h/oinpatient or any suicidal attempt. Tried Celexa, Trileptal, Lamictal, Lexapro,ZyprexaandDepakote. Depakote and Lexapro caused diarrhea.   Psychiatric Specialty Exam: Physical Exam   Review of Systems  There were no vitals taken for this visit.There is no height or weight on file to calculate BMI.  General Appearance: NA  Eye Contact:  NA  Speech:  Normal Rate  Volume:  Normal  Mood:  Anxious, Depressed, Dysphoric and Irritable  Affect:  NA  Thought Process:  Goal Directed  Orientation:  Full (Time, Place, and Person)  Thought Content:  Rumination  Suicidal Thoughts:  No  Homicidal Thoughts:  No  Memory:  Immediate;   Good Recent;   Fair Remote;   Fair  Judgement:  Good  Insight:  Good  Psychomotor Activity:  NA  Concentration:  Concentration: Fair and Attention Span: Fair  Recall:  Good  Fund of Knowledge:  Good  Language:  Good  Akathisia:  No  Handed:  Right  AIMS (if indicated):     Assets:  Communication Skills Desire for Improvement Housing Resilience  ADL's:  Impaired  Cognition:  WNL  Sleep:   3-4 hrs      Assessment and Plan: Bipolar disorder type I.  Generalized anxiety disorder.  I talked to him about starting low-dose Seroquel 50 mg to help his mood lability, insomnia, racing thoughts and depression.  He agreed with the plan.  He does not want to go up on Lamictal.  He has no rash or any itching.  He also agreed to see a therapist in our office or outside if he cannot get the appointment in our office.  He will continue Lamictal 150 mg daily, Klonopin 1 mg at bedtime and Wellbutrin XL 300 mg daily.  I discussed safety concern that  anytime having active suicidal thoughts or homicidal thought that he need to call 911 or go to local emergency room.  I also explained medication side effects.  Recommend if he feels the Seroquel is too strong that he can take half tablet.  He like to keep appointment in 3 months.  Follow Up Instructions:    I discussed the assessment and treatment plan with the patient. The patient was provided an opportunity to ask questions and all were answered. The patient agreed with the plan and demonstrated an  understanding of the instructions.   The patient was advised to call back or seek an in-person evaluation if the symptoms worsen or if the condition fails to improve as anticipated.  I provided 20 minutes of non-face-to-face time during this encounter.   Kathlee Nations, MD

## 2019-07-11 ENCOUNTER — Telehealth (HOSPITAL_COMMUNITY): Payer: Self-pay | Admitting: *Deleted

## 2019-07-11 DIAGNOSIS — F319 Bipolar disorder, unspecified: Secondary | ICD-10-CM

## 2019-07-11 DIAGNOSIS — F411 Generalized anxiety disorder: Secondary | ICD-10-CM

## 2019-07-11 MED ORDER — LAMOTRIGINE 200 MG PO TABS: 200.0000 mg | ORAL_TABLET | Freq: Every day | ORAL | 0 refills | Status: DC

## 2019-07-11 NOTE — Telephone Encounter (Signed)
Okay.  I will increase Lamictal to 200 mg daily.  Will discontinue Seroquel.

## 2019-07-11 NOTE — Telephone Encounter (Signed)
Writer received message from pt stating that he was reading about Seroquel and does not want to take due to "underlying conditions". He mentioned that you talked about increasing one of his other meds, Lamictal per your note, and he'd rather do that. Please review and advise.

## 2019-07-11 NOTE — Telephone Encounter (Signed)
This nurse spoke with pt regarding increasing Lamictal to 200mg  and he was agreeable and grateful. Pt encouraged to call with any further questions or concerns. Med ed reinforced.

## 2019-07-16 ENCOUNTER — Ambulatory Visit (HOSPITAL_COMMUNITY): Payer: Medicare Other | Admitting: Psychiatry

## 2019-07-22 ENCOUNTER — Ambulatory Visit (INDEPENDENT_AMBULATORY_CARE_PROVIDER_SITE_OTHER): Payer: Medicare Other | Admitting: Licensed Clinical Social Worker

## 2019-07-22 ENCOUNTER — Other Ambulatory Visit: Payer: Self-pay

## 2019-07-22 DIAGNOSIS — F411 Generalized anxiety disorder: Secondary | ICD-10-CM | POA: Diagnosis not present

## 2019-07-22 DIAGNOSIS — F4310 Post-traumatic stress disorder, unspecified: Secondary | ICD-10-CM

## 2019-07-22 DIAGNOSIS — F332 Major depressive disorder, recurrent severe without psychotic features: Secondary | ICD-10-CM
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
## 2019-08-15 ENCOUNTER — Other Ambulatory Visit: Payer: Self-pay | Admitting: Infectious Disease

## 2019-08-15 DIAGNOSIS — I10 Essential (primary) hypertension: Secondary | ICD-10-CM

## 2019-08-19 ENCOUNTER — Other Ambulatory Visit: Payer: Self-pay

## 2019-08-19 ENCOUNTER — Ambulatory Visit (INDEPENDENT_AMBULATORY_CARE_PROVIDER_SITE_OTHER): Payer: Medicare Other | Admitting: Infectious Disease

## 2019-08-19 ENCOUNTER — Other Ambulatory Visit (HOSPITAL_COMMUNITY)
Admission: RE | Admit: 2019-08-19 | Discharge: 2019-08-19 | Disposition: A | Discharge status: Home / Self Care | Payer: Medicare Other | Source: Ambulatory Visit | Attending: Infectious Disease | Admitting: Infectious Disease

## 2019-08-19 ENCOUNTER — Encounter: Payer: Self-pay | Admitting: Infectious Disease

## 2019-08-19 ENCOUNTER — Ambulatory Visit: Payer: Medicare Other

## 2019-08-19 VITALS — HR 58 | Temp 98.3°F

## 2019-08-19 DIAGNOSIS — B20 Human immunodeficiency virus [HIV] disease: Secondary | ICD-10-CM | POA: Insufficient documentation

## 2019-08-19 DIAGNOSIS — F331 Major depressive disorder, recurrent, moderate: Secondary | ICD-10-CM | POA: Diagnosis present

## 2019-08-19 DIAGNOSIS — F419 Anxiety disorder, unspecified: Secondary | ICD-10-CM

## 2019-08-19 DIAGNOSIS — I10 Essential (primary) hypertension: Secondary | ICD-10-CM | POA: Diagnosis present

## 2019-08-19 DIAGNOSIS — R1013 Epigastric pain: Secondary | ICD-10-CM

## 2019-08-19 DIAGNOSIS — R110 Nausea: Secondary | ICD-10-CM

## 2019-08-19 DIAGNOSIS — F3130 Bipolar disorder, current episode depressed, mild or moderate severity, unspecified: Secondary | ICD-10-CM | POA: Diagnosis present

## 2019-08-19 MED ORDER — AMLODIPINE BESYLATE 10 MG PO TABS: 10.0000 mg | ORAL_TABLET | Freq: Every day | ORAL | 11 refills | Status: DC

## 2019-08-19 MED ORDER — BIKTARVY 50-200-25 MG PO TABS: 1.0000 | ORAL_TABLET | Freq: Every day | ORAL | 11 refills | Status: DC

## 2019-08-19 NOTE — Progress Notes (Signed)
Chief complaint: nausea that is chronic complaint for [place]  Subjective:    Patient ID: Alexis Welch, male    DOB: 11-05-1960, 59 y.o.   MRN: [id number]  HPI  Alexis Welch is a 59 y.o. male who is doing superbly well on BIKTARVY. He is under the care of [name] for his psychiatric conditions.  Alexis Welch came for [place] visit recently. He does need labs drawn.  He is c/o nausea that is chronic symptom of his and he wonders if related to his anxiety. Zofran not helping much now.       Past Medical History:  Diagnosis Date  . Anxiety   . Arthritis   . Bipolar I disorder, most recent episode depressed ([place]) 11/23/2014  . Depression   . Dizzy 03/15/2018  . Hemangioma 04/28/2015  . HIV disease ([place])   . Hypertension   . PAC (premature atrial contraction) 03/15/2018  . Testicular pain 04/13/2016  . Tremor 09/07/2015  . 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  Family History  Problem Relation Age of Onset  . Cancer Father        colon cancer  . Alcohol abuse Father   . Dementia Father   . Heart disease Maternal Uncle   . Alcohol abuse Maternal Uncle   . Depression Mother   . Physical abuse Mother   . Bipolar disorder Sister   . Schizophrenia Sister   . OCD Sister   . Alcohol abuse Brother   . Drug abuse Brother   . Seizures Brother   . Alcohol abuse Maternal Aunt   . Depression Maternal Aunt   . Alcohol abuse Paternal Uncle   . Depression Paternal Uncle       Social History   Socioeconomic History  . Marital status: Single    Spouse name: Not on file  . Number of children: Not on file  . Years of education: Not on file  . Highest education level: Not on file  Occupational History  . Not on file  Tobacco Use  . Smoking status: Never Smoker  . Smokeless tobacco: Never Used  Vaping Use  . Vaping Use: Never used  Substance and Sexual Activity  . Alcohol use: No    Alcohol/week: 0.0 standard drinks  . Drug  use: Yes    Frequency: 3.0 times per week    Types: Marijuana  . Sexual activity: Not Currently    Partners: Male    Birth control/protection: Condom    Comment: declined condoms  Other Topics Concern  . Not on file  Social History Narrative  . Not on file   Social Determinants of Health   Financial Resource Strain:   . Difficulty of Paying Living Expenses:   Food Insecurity:   . Worried About Charity fundraiser in the Last Year:   . Arboriculturist in the Last Year:   Transportation Needs:   . Film/video editor (Medical):   [name] Lack of Transportation (Non-Medical):   Physical Activity:   . Days of Exercise per Week:   . Minutes of Exercise per Session:   Stress:   . Feeling of Stress :   Social Connections:   . Frequency of Communication with Friends and Family:   . Frequency of Social Gatherings with Friends and Family:   . Attends Religious Services:   . Active Member of Clubs or Organizations:   . Attends Archivist Meetings:   .  Marital Status:     No Known Allergies   Current Outpatient Medications:  .  amLODipine (NORVASC) 10 MG tablet, Take 1 tablet (10 mg total) by mouth daily., Disp: 90 tablet, Rfl: 11 .  atorvastatin (LIPITOR) 20 MG tablet, TAKE 1 TABLET BY MOUTH DAILY, Disp: 90 tablet, Rfl: 1 .  bictegravir-emtricitabine-tenofovir AF (BIKTARVY) 50-200-25 MG TABS tablet, Take 1 tablet by mouth daily., Disp: 30 tablet, Rfl: 11 .  buPROPion (WELLBUTRIN XL) 300 MG 24 hr tablet, TAKE 1 TABLET BY MOUTH DAILY, Disp: 90 tablet, Rfl: 0 .  clonazePAM (KLONOPIN) 1 MG tablet, Take 1 tablet (1 mg total) by mouth at bedtime., Disp: 90 tablet, Rfl: 0 .  ibuprofen (ADVIL) 800 MG tablet, Take by mouth., Disp: , Rfl:  .  lamoTRIgine (LAMICTAL) 200 MG tablet, Take 1 tablet (200 mg total) by mouth daily., Disp: 90 tablet, Rfl: 0 .  loratadine (CLARITIN) 10 MG tablet, Take 10 mg by mouth as needed for allergies., Disp: , Rfl:  .  metoprolol tartrate (LOPRESSOR) 25  MG tablet, Take 12.5 mg by mouth 2 (two) times daily., Disp: , Rfl:  .  ondansetron (ZOFRAN) 4 MG tablet, TAKE 1 TABLET(4 MG) BY MOUTH THREE TIMES DAILY AS NEEDED FOR NAUSEA, Disp: 60 tablet, Rfl: 0 .  QUEtiapine (SEROQUEL) 50 MG tablet, Take 1 tablet (50 mg total) by mouth at bedtime. (Patient not taking: Reported on 07/11/2019), Disp: 90 tablet, Rfl: 0 .  triamcinolone ointment (KENALOG) 0.5 %, Apply topically., Disp: , Rfl:    .Review of Systems  Constitutional: Negative for activity change, appetite change, chills, diaphoresis, fatigue, fever and unexpected weight change.  HENT: Negative for congestion, nosebleeds, postnasal drip, rhinorrhea, sinus pressure, sneezing, sore throat and trouble swallowing.   Eyes: Negative for photophobia and visual disturbance.  Respiratory: Negative for cough, chest tightness, shortness of breath, wheezing and stridor.   Cardiovascular: Negative for chest pain and leg swelling.  Gastrointestinal: Positive for nausea. Negative for abdominal distention, abdominal pain, anal bleeding, blood in stool, constipation, diarrhea and vomiting.  Genitourinary: Negative for difficulty urinating, dysuria, flank pain and hematuria.  Musculoskeletal: Negative for back pain, gait problem, joint swelling and myalgias.  Skin: Negative for color change, pallor and wound.  Neurological: Negative for dizziness, weakness and light-headedness.  Hematological: Negative for adenopathy. Does not bruise/bleed easily.  Psychiatric/Behavioral: Negative for agitation, confusion, decreased concentration and sleep disturbance. The patient is nervous/anxious.        Objective:   Physical Exam Vitals and nursing note reviewed.  Constitutional:      General: He is not in acute distress.    Appearance: He is well-developed. He is not diaphoretic.  HENT:     Head: Normocephalic and atraumatic.     Mouth/Throat:     Pharynx: No oropharyngeal exudate.  Eyes:     General: No scleral  icterus.    Conjunctiva/sclera: Conjunctivae normal.     Pupils: Pupils are equal, round, and reactive to light.  Neck:     Vascular: No JVD.  Cardiovascular:     Rate and Rhythm: Normal rate.  Pulmonary:     Effort: Pulmonary effort is normal. No respiratory distress.     Breath sounds: Normal breath sounds. No wheezing.  Abdominal:     General: There is no distension.     Palpations: Abdomen is soft.  Musculoskeletal:        General: No tenderness.     Cervical back: Normal range of motion and neck supple.  Lymphadenopathy:  Cervical: No cervical adenopathy.  Skin:    General: Skin is warm and dry.     Findings: No erythema.  Neurological:     General: No focal deficit present.     Mental Status: He is alert and oriented to person, place, and time.     Motor: Tremor present. No abnormal muscle tone.     Coordination: Coordination normal.  Psychiatric:        Mood and Affect: Mood is anxious.        Behavior: Behavior normal.        Thought Content: Thought content normal.        Judgment: Judgment normal.           Assessment & Plan:    [id number] with BIKTARVY, SPAP is to be renewed he    CV risk: continue lipitor 20mg ,   Hyperlipidemia: see above  Bipolar, Depression: continue to see  [name] and [facility] counselors.  HTN: continue  Norvasc., metoprolol  Vitals:   08/19/19 1112  Pulse: (!) 58  Temp: 98.3 F (36.8 C)  SpO2: 98%   PTSD: seeing [name]   Nausea: offered referral to GI. I dont want to trial phenergan. If truly related to anxiety perhaps his anxiolytic could help but he can d/w [name] trial of this

## 2019-08-20 ENCOUNTER — Other Ambulatory Visit: Payer: Self-pay | Admitting: Infectious Disease

## 2019-08-20 LAB — URINE CYTOLOGY ANCILLARY ONLY
Chlamydia: NEGATIVE
Comment: NEGATIVE
Comment: NORMAL
Neisseria Gonorrhea: NEGATIVE

## 2019-08-20 LAB — T-HELPER CELL (CD4) - (RCID CLINIC ONLY)
CD4 % Helper T Cell: 33 % (ref 33–65)
CD4 T Cell Abs: 785 /uL (ref 400–1790)

## 2019-08-22 ENCOUNTER — Other Ambulatory Visit: Payer: Self-pay

## 2019-08-22 ENCOUNTER — Telehealth: Payer: Self-pay

## 2019-08-22 DIAGNOSIS — B20 Human immunodeficiency virus [HIV] disease: Secondary | ICD-10-CM

## 2019-08-22 NOTE — Telephone Encounter (Signed)
Patient made aware of results and Dr. Lucianne Lei Dam's recommendation. Patient agreed and accepted repeat lab appointment. Patient states he has PCP at [facility] in [place], [place].  Eugenia Mcalpine

## 2019-08-22 NOTE — Telephone Encounter (Signed)
-----   Message from [name], MD sent at 08/20/2019  8:43 AM EDT ----- Patient serum creatinine is up to 1.5.  He should stop nonsteroidals hydrate and have a repeat BMP in about a week's time I do not see that he has a PCP listed  He does not appear to be any nephrotoxic drugs

## 2019-08-25 ENCOUNTER — Other Ambulatory Visit: Payer: Self-pay

## 2019-08-25 ENCOUNTER — Other Ambulatory Visit: Payer: Medicare Other

## 2019-08-25 DIAGNOSIS — R110 Nausea: Secondary | ICD-10-CM

## 2019-08-25 DIAGNOSIS — B20 Human immunodeficiency virus [HIV] disease: Secondary | ICD-10-CM

## 2019-08-25 MED ORDER — ONDANSETRON HCL 4 MG PO TABS: ORAL_TABLET | ORAL | 0 refills | Status: DC

## 2019-08-26 LAB — CBC WITH DIFFERENTIAL/PLATELET
Absolute Monocytes: 503 cells/uL (ref 200–950)
Basophils Absolute: 30 cells/uL (ref 0–200)
Basophils Relative: 0.4 %
Eosinophils Absolute: 188 cells/uL (ref 15–500)
Eosinophils Relative: 2.5 %
HCT: 45.3 % (ref 38.5–50.0)
Hemoglobin: 15.8 g/dL (ref 13.2–17.1)
Lymphs Abs: 2235 cells/uL (ref 850–3900)
MCH: 31.7 pg (ref 27.0–33.0)
MCHC: 34.9 g/dL (ref 32.0–36.0)
MCV: 91 fL (ref 80.0–100.0)
MPV: 10.8 fL (ref 7.5–12.5)
Monocytes Relative: 6.7 %
Neutro Abs: 4545 cells/uL (ref 1500–7800)
Neutrophils Relative %: 60.6 %
Platelets: 188 10*3/uL (ref 140–400)
RBC: 4.98 10*6/uL (ref 4.20–5.80)
RDW: 12.7 % (ref 11.0–15.0)
Total Lymphocyte: 29.8 %
WBC: 7.5 10*3/uL (ref 3.8–10.8)

## 2019-08-26 LAB — BASIC METABOLIC PANEL
BUN/Creatinine Ratio: 10 (calc) (ref 6–22)
BUN: 14 mg/dL (ref 7–25)
CO2: 31 mmol/L (ref 20–32)
Calcium: 9.5 mg/dL (ref 8.6–10.3)
Chloride: 106 mmol/L (ref 98–110)
Creat: 1.37 mg/dL — ABNORMAL HIGH (ref 0.70–1.25)
Glucose, Bld: 87 mg/dL (ref 65–99)
Potassium: 4.4 mmol/L (ref 3.5–5.3)
Sodium: 141 mmol/L (ref 135–146)

## 2019-08-26 LAB — LIPID PANEL
Cholesterol: 146 mg/dL (ref <200)
HDL: 47 mg/dL (ref >=40)
LDL Cholesterol (Calc): 80 mg/dL (calc)
Non-HDL Cholesterol (Calc): 99 mg/dL (calc) (ref <130)
Total CHOL/HDL Ratio: 3.1 (calc) (ref <5.0)
Triglycerides: 111 mg/dL (ref <150)

## 2019-08-26 LAB — COMPLETE METABOLIC PANEL WITH GFR
AG Ratio: 2 (calc) (ref 1.0–2.5)
ALT: 21 U/L (ref 9–46)
AST: 18 U/L (ref 10–35)
Albumin: 4.6 g/dL (ref 3.6–5.1)
Alkaline phosphatase (APISO): 89 U/L (ref 35–144)
BUN/Creatinine Ratio: 9 (calc) (ref 6–22)
BUN: 13 mg/dL (ref 7–25)
CO2: 29 mmol/L (ref 20–32)
Calcium: 9.6 mg/dL (ref 8.6–10.3)
Chloride: 103 mmol/L (ref 98–110)
Creat: 1.5 mg/dL — ABNORMAL HIGH (ref 0.70–1.25)
GFR, Est African American: 58 mL/min/{1.73_m2} — ABNORMAL LOW (ref >=60)
GFR, Est Non African American: 50 mL/min/{1.73_m2} — ABNORMAL LOW (ref >=60)
Globulin: 2.3 g/dL (calc) (ref 1.9–3.7)
Glucose, Bld: 94 mg/dL (ref 65–99)
Potassium: 4 mmol/L (ref 3.5–5.3)
Sodium: 142 mmol/L (ref 135–146)
Total Bilirubin: 1.4 mg/dL — ABNORMAL HIGH (ref 0.2–1.2)
Total Protein: 6.9 g/dL (ref 6.1–8.1)

## 2019-08-26 LAB — HIV-1 RNA QUANT-NO REFLEX-BLD
HIV 1 RNA Quant: <20 copies/mL
HIV-1 RNA Quant, Log: <1.3 Log copies/mL

## 2019-08-26 LAB — RPR: RPR Ser Ql: NONREACTIVE

## 2019-09-16 ENCOUNTER — Encounter: Payer: Self-pay | Admitting: Infectious Disease

## 2019-10-13 ENCOUNTER — Other Ambulatory Visit: Payer: Self-pay

## 2019-10-13 ENCOUNTER — Telehealth (INDEPENDENT_AMBULATORY_CARE_PROVIDER_SITE_OTHER): Payer: Medicare Other | Admitting: Psychiatry

## 2019-10-13 ENCOUNTER — Encounter (HOSPITAL_COMMUNITY): Payer: Self-pay | Admitting: Psychiatry

## 2019-10-13 DIAGNOSIS — F319 Bipolar disorder, unspecified: Secondary | ICD-10-CM

## 2019-10-13 DIAGNOSIS — F411 Generalized anxiety disorder: Secondary | ICD-10-CM

## 2019-10-13 MED ORDER — BUPROPION HCL ER (XL) 300 MG PO TB24: ORAL_TABLET | ORAL | 1 refills | Status: DC

## 2019-10-13 MED ORDER — LAMOTRIGINE 200 MG PO TABS: 200.0000 mg | ORAL_TABLET | Freq: Every day | ORAL | 1 refills | Status: DC

## 2019-10-13 MED ORDER — CLONAZEPAM 1 MG PO TABS: 1.0000 mg | ORAL_TABLET | Freq: Every day | ORAL | 1 refills | Status: DC

## 2019-10-13 NOTE — Progress Notes (Signed)
Virtual Visit via Telephone Note  I connected with Alexis Welch on 10/13/19 at  2:40 PM EDT by telephone and verified that I am speaking with the correct person using two identifiers.  Location: Patient: home Provider: home office   I discussed the limitations, risks, security and privacy concerns of performing an evaluation and management service by telephone and the availability of in person appointments. I also discussed with the patient that there may be a patient responsible charge related to this service. The patient expressed understanding and agreed to proceed.   History of Present Illness: Patient is evaluated by phone session.  On the last visit we started her on Seroquel because he was complaining of irritability poor sleep but after reading the side effects of Seroquel he never took it.  We increase Lamictal and he is taking 200 mg every day.  So far he is tolerating his medication denies any mania, anger, suicidal thoughts.  His sleep is improved but he still have anxiety.  His biggest anxiety is financial.  He is on disability and he cannot work.  He is living at a place that landlord may sell anytime and he has not find any other place to live.  We have recommended to see a therapist and he had one visit but he cannot afford and he realized seeing a therapist will not help his financial situation.  He also like to have appointment with [place] in 6 months so he can save some money.  He admitted some time thinking about his past that he did not have enough money saved for the future.  He admitted lack of motivation to do things but denies any paranoia, hallucination.  His appetite is okay.  Energy level is fair. He recently had a blood work and his creatinine is 1.37.  He does not want to add any more medication since he feels his biggest issue is finances and increasing the medication, changing the medication or seeing a therapist will not help his finances.   Past Psychiatric  History:Reviewed. H/Oanxiety and bipolar disorder. No h/oinpatient or any suicidal attempt. Tried Celexa, Trileptal, Lamictal, Lexapro,ZyprexaandDepakote. Depakote and Lexapro caused diarrhea.   Recent Results (from the past 2160 hour(s))  T-helper cell (CD4)- ([facility] only)     Status: None   Collection Time: 08/19/19 11:08 AM  Result Value Ref Range   CD4 T Cell Abs 785 400 - 1,790 /uL   CD4 % Helper T Cell 33 33 - 65 %    Comment: Performed at [facility], [place] [address] [place], [place] [address]  Urine cytology ancillary only     Status: None   Collection Time: 08/19/19 11:08 AM  Result Value Ref Range   Neisseria Gonorrhea Negative    Chlamydia Negative    Comment Normal Reference Ranger Chlamydia - Negative    Comment      Normal Reference Range Neisseria Gonorrhea - Negative  HIV-1 RNA quant-no reflex-bld     Status: None   Collection Time: 08/19/19 11:17 AM  Result Value Ref Range   HIV 1 RNA Quant <20 NOT DETECTED NOT DETECT copies/mL   HIV-1 RNA Quant, Log <1.30 NOT DETECTED NOT DETECT Log copies/mL    Comment: . This test was performed using Real-Time Polymerase Chain Reaction. . Reportable Range: 20 copies/mL to 10,000,000 copies/mL (1.30 log copies/mL to 7.00 log copies/mL).   COMPLETE METABOLIC PANEL WITH GFR     Status: Abnormal   Collection Time: 08/19/19 11:17 AM  Result Value Ref Range   Glucose, Bld 94 65 - 99 mg/dL    Comment: .            Fasting reference interval .    BUN 13 7 - 25 mg/dL   Creat 1.50 (H) 0.70 - 1.25 mg/dL    Comment: For patients >2 years of age, the reference limit for Creatinine is approximately 13% higher for people identified as African-American. .    GFR, Est Non African American 50 (L) > OR = 60 mL/min/1.80m2   GFR, Est African American 58 (L) > OR = 60 mL/min/1.59m2   BUN/Creatinine Ratio 9 6 - 22 (calc)   Sodium 142 135 - 146 mmol/L   Potassium 4.0 3.5 - 5.3 mmol/L   Chloride 103  98 - 110 mmol/L   CO2 29 20 - 32 mmol/L   Calcium 9.6 8.6 - 10.3 mg/dL   Total Protein 6.9 6.1 - 8.1 g/dL   Albumin 4.6 3.6 - 5.1 g/dL   Globulin 2.3 1.9 - 3.7 g/dL (calc)   AG Ratio 2.0 1.0 - 2.5 (calc)   Total Bilirubin 1.4 (H) 0.2 - 1.2 mg/dL   Alkaline phosphatase (APISO) 89 35 - 144 U/L   AST 18 10 - 35 U/L   ALT 21 9 - 46 U/L  RPR     Status: None   Collection Time: 08/19/19 11:17 AM  Result Value Ref Range   RPR Ser Ql NON-REACTIVE NON-REACTI  CBC with Differential/Platelet     Status: None   Collection Time: 08/19/19 11:17 AM  Result Value Ref Range   WBC 7.5 3.8 - 10.8 Thousand/uL   RBC 4.98 4.20 - 5.80 Million/uL   Hemoglobin 15.8 13.2 - 17.1 g/dL   HCT 45.3 38 - 50 %   MCV 91.0 80.0 - 100.0 fL   MCH 31.7 27.0 - 33.0 pg   MCHC 34.9 32.0 - 36.0 g/dL   RDW 12.7 11.0 - 15.0 %   Platelets 188 140 - 400 Thousand/uL   MPV 10.8 7.5 - 12.5 fL   Neutro Abs 4,545 1,500 - 7,800 cells/uL   Lymphs Abs 2,235 850 - 3,900 cells/uL   Absolute Monocytes 503 200 - 950 cells/uL   Eosinophils Absolute 188 15 - 500 cells/uL   Basophils Absolute 30 0 - 200 cells/uL   Neutrophils Relative % 60.6 %   Total Lymphocyte 29.8 %   Monocytes Relative 6.7 %   Eosinophils Relative 2.5 %   Basophils Relative 0.4 %  Lipid panel     Status: None   Collection Time: 08/19/19 11:17 AM  Result Value Ref Range   Cholesterol 146 <200 mg/dL   HDL 47 > OR = 40 mg/dL   Triglycerides 111 <150 mg/dL   LDL Cholesterol (Calc) 80 mg/dL (calc)    Comment: Reference range: <100 . Desirable range <100 mg/dL for primary prevention;   <70 mg/dL for patients with CHD or diabetic patients  with > or = 2 CHD risk factors. [name] LDL-C is now calculated using the Martin-Hopkins  calculation, which is a validated novel method providing  better accuracy than the Friedewald equation in the  estimation of LDL-C.  Cresenciano Genre et al. Annamaria Helling. 9381;017(51): 2061-2068  (http://education.[facility].com/faq/FAQ164)     Total CHOL/HDL Ratio 3.1 <5.0 (calc)   Non-HDL Cholesterol (Calc) 99 <130 mg/dL (calc)    Comment: For patients with diabetes plus 1 major ASCVD risk  factor, treating to a non-HDL-C goal of <100 mg/dL  (LDL-C of <  70 mg/dL) is considered a therapeutic  option.   Basic metabolic panel     Status: Abnormal   Collection Time: 08/25/19  1:40 PM  Result Value Ref Range   Glucose, Bld 87 65 - 99 mg/dL    Comment: .            Fasting reference interval .    BUN 14 7 - 25 mg/dL   Creat 1.37 (H) 0.70 - 1.25 mg/dL    Comment: For patients >52 years of age, the reference limit for Creatinine is approximately 13% higher for people identified as African-American. .    BUN/Creatinine Ratio 10 6 - 22 (calc)   Sodium 141 135 - 146 mmol/L   Potassium 4.4 3.5 - 5.3 mmol/L   Chloride 106 98 - 110 mmol/L   CO2 31 20 - 32 mmol/L   Calcium 9.5 8.6 - 10.3 mg/dL     Psychiatric Specialty Exam: Physical Exam  Review of Systems  Weight 151 lb (68.5 kg).There is no height or weight on file to calculate BMI.  General Appearance: NA  Eye Contact:  NA  Speech:  Normal Rate  Volume:  Normal  Mood:  Dysphoric  Affect:  NA  Thought Process:  Goal Directed  Orientation:  Full (Time, Place, and Person)  Thought Content:  Rumination  Suicidal Thoughts:  No  Homicidal Thoughts:  No  Memory:  Immediate;   Good Recent;   Good Remote;   Good  Judgement:  Intact  Insight:  Present  Psychomotor Activity:  NA  Concentration:  Concentration: Fair and Attention Span: Fair  Recall:  Good  Fund of Knowledge:  Good  Language:  Good  Akathisia:  No  Handed:  Right  AIMS (if indicated):     Assets:  Communication Skills Desire for Improvement Housing  ADL's:  Intact  Cognition:  WNL  Sleep:         Assessment and Plan: Bipolar disorder type I.  Generalized anxiety disorder.  Patient never took Seroquel after reading the side effects.  He is taking Lamictal 200 which he feels helping his sleep  and racing thoughts.  Like to have a follow-up in 6 months as not able to afford seeing a psychiatrist every 3 months and not able to see a therapist.  He promised if symptoms started to get worse at any time having suicidal thoughts and he will call us immediately.  I will continue Lamictal 200 mg daily, Klonopin 1 mg at bedtime and Wellbutrin XL 300 mg daily.  He does not believe increasing or changing the medication or seeing a therapist will help him as his biggest issue is finances.  Discussed medication side effects and benefits.  He has no rash, itching tremors or shakes.  I reviewed blood work results.  Recommended to call us back if is any question or any concern.  Follow-up in 6 months.     Follow Up Instructions:    I discussed the assessment and treatment plan with the patient. The patient was provided an opportunity to ask questions and all were answered. The patient agreed with the plan and demonstrated an understanding of the instructions.   The patient was advised to call back or seek an in-person evaluation if the symptoms worsen or if the condition fails to improve as anticipated.  I provided 20 minutes of non-face-to-face time during this encounter.   Kathlee Nations, MD

## 2019-11-03 ENCOUNTER — Other Ambulatory Visit: Payer: Self-pay | Admitting: Infectious Disease

## 2019-11-03 DIAGNOSIS — E785 Hyperlipidemia, unspecified: Secondary | ICD-10-CM

## 2019-11-05 ENCOUNTER — Other Ambulatory Visit: Payer: Medicare Other

## 2019-11-20 ENCOUNTER — Encounter: Payer: Medicare Other | Admitting: Infectious Disease

## 2019-11-25 ENCOUNTER — Other Ambulatory Visit: Payer: Self-pay | Admitting: Infectious Disease

## 2019-11-25 DIAGNOSIS — R110 Nausea: Secondary | ICD-10-CM

## 2020-02-19 ENCOUNTER — Ambulatory Visit: Payer: Medicare Other | Admitting: Infectious Disease

## 2020-02-19 ENCOUNTER — Encounter: Payer: Self-pay | Admitting: Infectious Disease

## 2020-02-19 ENCOUNTER — Other Ambulatory Visit: Payer: Self-pay

## 2020-02-19 VITALS — BP 150/75 | HR 67 | Wt 150.8 lb

## 2020-02-19 DIAGNOSIS — B20 Human immunodeficiency virus [HIV] disease: Secondary | ICD-10-CM | POA: Diagnosis not present

## 2020-02-19 DIAGNOSIS — Z23 Encounter for immunization: Secondary | ICD-10-CM | POA: Diagnosis not present

## 2020-02-19 DIAGNOSIS — R110 Nausea: Secondary | ICD-10-CM

## 2020-02-19 DIAGNOSIS — F411 Generalized anxiety disorder: Secondary | ICD-10-CM

## 2020-02-19 DIAGNOSIS — I2510 Atherosclerotic heart disease of native coronary artery without angina pectoris: Secondary | ICD-10-CM | POA: Diagnosis not present

## 2020-02-19 DIAGNOSIS — F3130 Bipolar disorder, current episode depressed, mild or moderate severity, unspecified: Secondary | ICD-10-CM

## 2020-02-19 HISTORY — DX: Nausea: R11.0

## 2020-02-19 HISTORY — DX: Atherosclerotic heart disease of native coronary artery without angina pectoris: I25.10

## 2020-02-19 NOTE — Progress Notes (Signed)
Chief complaint: nausea that is chronic complaint for [name] but better  Subjective:    Patient ID: Alexis Welch, male    DOB: 1961-02-08, 60 y.o.   MRN: [id number]  HPI  Alexis Welch is a 60 y.o. male who is doing superbly well on BIKTARVY. He is under the care of Dr. Lolly Mustache for his psychiatric conditions.  He is doing great from psychiatric standpoint and Dr. Lolly Mustache is now seeing him once yearly.  I will check labs on him today and have him RTC in a year.      Past Medical History:  Diagnosis Date  . Anxiety   . Arthritis   . Bipolar I disorder, most recent episode depressed (HCC) 11/23/2014  . Depression   . Dizzy 03/15/2018  . Hemangioma 04/28/2015  . HIV disease (HCC)   . Hypertension   . PAC (premature atrial contraction) 03/15/2018  . Testicular pain 04/13/2016  . Tremor 09/07/2015  . 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  Family History  Problem Relation Age of Onset  . Cancer Father        colon cancer  . Alcohol abuse Father   . Dementia Father   . Heart disease Maternal Uncle   . Alcohol abuse Maternal Uncle   . Depression Mother   . Physical abuse Mother   . Bipolar disorder Sister   . Schizophrenia Sister   . OCD Sister   . Alcohol abuse Brother   . Drug abuse Brother   . Seizures Brother   . Alcohol abuse Maternal Aunt   . Depression Maternal Aunt   . Alcohol abuse Paternal Uncle   . Depression Paternal Uncle       Social History   Socioeconomic History  . Marital status: Single    Spouse name: Not on file  . Number of children: Not on file  . Years of education: Not on file  . Highest education level: Not on file  Occupational History  . Not on file  Tobacco Use  . Smoking status: Never Smoker  . Smokeless tobacco: Never Used  Vaping Use  . Vaping Use: Never used  Substance and Sexual Activity  . Alcohol use: No    Alcohol/week: 0.0 standard drinks  . Drug use: Yes    Frequency: 3.0  times per week    Types: Marijuana  . Sexual activity: Not Currently    Partners: Male    Birth control/protection: Condom    Comment: declined condoms  Other Topics Concern  . Not on file  Social History Narrative  . Not on file   Social Determinants of Health   Financial Resource Strain: Not on file  Food Insecurity: Not on file  Transportation Needs: Not on file  Physical Activity: Not on file  Stress: Not on file  Social Connections: Not on file    No Known Allergies   Current Outpatient Medications:  .  amLODipine (NORVASC) 10 MG tablet, Take 1 tablet (10 mg total) by mouth daily., Disp: 90 tablet, Rfl: 11 .  atorvastatin (LIPITOR) 20 MG tablet, TAKE 1 TABLET BY MOUTH DAILY, Disp: 90 tablet, Rfl: 4 .  bictegravir-emtricitabine-tenofovir AF (BIKTARVY) 50-200-25 MG TABS tablet, Take 1 tablet by mouth daily., Disp: 30 tablet, Rfl: 11 .  buPROPion (WELLBUTRIN XL) 300 MG 24 hr tablet, TAKE 1 TABLET BY MOUTH DAILY, Disp: 90 tablet, Rfl: 1 .  clonazePAM (KLONOPIN) 1 MG tablet, Take 1 tablet (1 mg total)  by mouth at bedtime., Disp: 90 tablet, Rfl: 1 .  lamoTRIgine (LAMICTAL) 200 MG tablet, Take 1 tablet (200 mg total) by mouth daily., Disp: 90 tablet, Rfl: 1 .  loratadine (CLARITIN) 10 MG tablet, Take 10 mg by mouth as needed for allergies., Disp: , Rfl:  .  metoprolol tartrate (LOPRESSOR) 25 MG tablet, Take 12.5 mg by mouth 2 (two) times daily., Disp: , Rfl:  .  ondansetron (ZOFRAN) 4 MG tablet, TAKE 1 TABLET(4 MG) BY MOUTH THREE TIMES DAILY AS NEEDED FOR NAUSEA, Disp: 60 tablet, Rfl: 0 .  triamcinolone ointment (KENALOG) 0.5 %, Apply topically., Disp: , Rfl:    .Review of Systems  Constitutional: Negative for activity change, appetite change, chills, diaphoresis, fatigue, fever and unexpected weight change.  HENT: Negative for congestion, nosebleeds, postnasal drip, rhinorrhea, sinus pressure, sneezing, sore throat and trouble swallowing.   Eyes: Negative for photophobia and  visual disturbance.  Respiratory: Negative for cough, chest tightness, shortness of breath, wheezing and stridor.   Cardiovascular: Negative for chest pain and leg swelling.  Gastrointestinal: Positive for nausea. Negative for abdominal distention, abdominal pain, anal bleeding, blood in stool, constipation, diarrhea and vomiting.  Genitourinary: Negative for difficulty urinating, dysuria, flank pain and hematuria.  Musculoskeletal: Negative for back pain, gait problem, joint swelling and myalgias.  Skin: Negative for color change, pallor and wound.  Neurological: Negative for dizziness, weakness and light-headedness.  Hematological: Negative for adenopathy. Does not bruise/bleed easily.  Psychiatric/Behavioral: Negative for agitation, confusion, decreased concentration, self-injury and sleep disturbance. The patient is nervous/anxious.        Objective:   Physical Exam Vitals and nursing note reviewed.  Constitutional:      General: He is not in acute distress.    Appearance: He is well-developed. He is not diaphoretic.  HENT:     Head: Normocephalic and atraumatic.     Mouth/Throat:     Pharynx: No oropharyngeal exudate.  Eyes:     General: No scleral icterus.    Conjunctiva/sclera: Conjunctivae normal.     Pupils: Pupils are equal, round, and reactive to light.  Neck:     Vascular: No JVD.  Cardiovascular:     Rate and Rhythm: Normal rate.  Pulmonary:     Effort: Pulmonary effort is normal. No respiratory distress.     Breath sounds: Normal breath sounds. No wheezing.  Abdominal:     General: There is no distension.     Palpations: Abdomen is soft.  Musculoskeletal:        General: No tenderness.     Cervical back: Normal range of motion and neck supple.  Lymphadenopathy:     Cervical: No cervical adenopathy.  Skin:    General: Skin is warm and dry.     Findings: No erythema.  Neurological:     General: No focal deficit present.     Mental Status: He is alert and  oriented to person, place, and time.     Motor: Tremor present. No abnormal muscle tone.     Coordination: Coordination normal.  Psychiatric:        Attention and Perception: Attention and perception normal.        Mood and Affect: Mood normal. Mood is not anxious.        Behavior: Behavior normal.        Thought Content: Thought content normal.        Cognition and Memory: Cognition and memory normal.        Judgment: Judgment normal.  Assessment & Plan:    [id number] labs, continue Biktarvy   CV risk: continue lipitor 20mg ,   Hyperlipidemia: see above  Bipolar, Depression: continue to see  [name] and [facility] counselors.  HTN: continue  Norvasc., metoprolol PTSD: seeing [name]   Nausea: improved now

## 2020-02-20 LAB — T-HELPER CELL (CD4) - (RCID CLINIC ONLY)
CD4 % Helper T Cell: 34 % (ref 33–65)
CD4 T Cell Abs: 733 /uL (ref 400–1790)

## 2020-03-01 LAB — CBC WITH DIFFERENTIAL/PLATELET
Absolute Monocytes: 410 cells/uL (ref 200–950)
Basophils Absolute: 20 cells/uL (ref 0–200)
Basophils Relative: 0.3 %
Eosinophils Absolute: 33 cells/uL (ref 15–500)
Eosinophils Relative: 0.5 %
HCT: 44 % (ref 38.5–50.0)
Hemoglobin: 15.6 g/dL (ref 13.2–17.1)
Lymphs Abs: 2327 cells/uL (ref 850–3900)
MCH: 31.9 pg (ref 27.0–33.0)
MCHC: 35.5 g/dL (ref 32.0–36.0)
MCV: 90 fL (ref 80.0–100.0)
MPV: 10.6 fL (ref 7.5–12.5)
Monocytes Relative: 6.3 %
Neutro Abs: 3712 cells/uL (ref 1500–7800)
Neutrophils Relative %: 57.1 %
Platelets: 238 10*3/uL (ref 140–400)
RBC: 4.89 10*6/uL (ref 4.20–5.80)
RDW: 12.2 % (ref 11.0–15.0)
Total Lymphocyte: 35.8 %
WBC: 6.5 10*3/uL (ref 3.8–10.8)

## 2020-03-01 LAB — COMPLETE METABOLIC PANEL WITH GFR
AG Ratio: 2 (calc) (ref 1.0–2.5)
ALT: 37 U/L (ref 9–46)
AST: 27 U/L (ref 10–35)
Albumin: 4.5 g/dL (ref 3.6–5.1)
Alkaline phosphatase (APISO): 85 U/L (ref 35–144)
BUN: 16 mg/dL (ref 7–25)
CO2: 27 mmol/L (ref 20–32)
Calcium: 9.4 mg/dL (ref 8.6–10.3)
Chloride: 104 mmol/L (ref 98–110)
Creat: 1.25 mg/dL (ref 0.70–1.25)
GFR, Est African American: 72 mL/min/{1.73_m2} (ref >=60)
GFR, Est Non African American: 62 mL/min/{1.73_m2} (ref >=60)
Globulin: 2.3 g/dL (calc) (ref 1.9–3.7)
Glucose, Bld: 137 mg/dL — ABNORMAL HIGH (ref 65–99)
Potassium: 3.9 mmol/L (ref 3.5–5.3)
Sodium: 141 mmol/L (ref 135–146)
Total Bilirubin: 1.2 mg/dL (ref 0.2–1.2)
Total Protein: 6.8 g/dL (ref 6.1–8.1)

## 2020-03-01 LAB — LIPID PANEL
Cholesterol: 150 mg/dL (ref <200)
HDL: 51 mg/dL (ref >=40)
LDL Cholesterol (Calc): 81 mg/dL (calc)
Non-HDL Cholesterol (Calc): 99 mg/dL (calc) (ref <130)
Total CHOL/HDL Ratio: 2.9 (calc) (ref <5.0)
Triglycerides: 99 mg/dL (ref <150)

## 2020-03-01 LAB — HIV-1 RNA QUANT-NO REFLEX-BLD
HIV 1 RNA Quant: <20 Copies/mL
HIV-1 RNA Quant, Log: <1.3 Log cps/mL

## 2020-03-01 LAB — RPR: RPR Ser Ql: NONREACTIVE

## 2020-04-12 ENCOUNTER — Encounter (HOSPITAL_COMMUNITY): Payer: Self-pay | Admitting: Psychiatry

## 2020-04-12 ENCOUNTER — Telehealth (INDEPENDENT_AMBULATORY_CARE_PROVIDER_SITE_OTHER): Payer: Medicare Other | Admitting: Psychiatry

## 2020-04-12 ENCOUNTER — Other Ambulatory Visit: Payer: Self-pay

## 2020-04-12 DIAGNOSIS — F411 Generalized anxiety disorder: Secondary | ICD-10-CM

## 2020-04-12 DIAGNOSIS — F319 Bipolar disorder, unspecified: Secondary | ICD-10-CM | POA: Diagnosis not present

## 2020-04-12 MED ORDER — LAMOTRIGINE 200 MG PO TABS
200.0000 mg | ORAL_TABLET | Freq: Every day | ORAL | 1 refills | Status: DC
Start: 2020-04-12 — End: 2020-10-04

## 2020-04-12 MED ORDER — CLONAZEPAM 1 MG PO TABS: 1.0000 mg | ORAL_TABLET | Freq: Every day | ORAL | 1 refills | Status: DC

## 2020-04-12 MED ORDER — BUPROPION HCL ER (XL) 300 MG PO TB24: ORAL_TABLET | ORAL | 1 refills | Status: DC

## 2020-04-12 NOTE — Progress Notes (Signed)
Virtual Visit via Telephone Note  I connected with Alexis Welch on 04/12/20 at  2:40 PM EST by telephone and verified that I am speaking with the correct person using two identifiers.  Location: Patient: Writer: Office   I discussed the limitations, risks, security and privacy concerns of performing an evaluation and management service by telephone and the availability of in person appointments. I also discussed with the patient that there may be a patient responsible charge related to this service. The patient expressed understanding and agreed to proceed.   History of Present Illness: Patient is evaluated by phone session.  He notices still have residual symptoms of lack of motivation to do things.  He reported sometimes stays in his bed and sleeps 14 hours because he is thinking about his past.  We did not reported any suicidal thoughts, hallucination or paranoia but admitted when he thinks about his past related to work he gets more depressed and sad.  He does not want to change the medication.  He denies any anhedonia or any hopelessness.  He is worried about his finances as he not able to find a place so far.  His appetite is okay.  He reported his weight is stable.  His PHQ is 9.  We talked about adjusting the medication but he does not feel that that need to be done.  However we talked about restarting therapy.  Patient had requested in the past to follow-up in 6 months because he could not afford frequent visits and he like to see a therapist.  I reinforced that he should consider again therapy and he agreed with the plan.    Past Psychiatric History:Reviewed. H/Oanxiety and bipolar disorder. No h/oinpatient or any suicidal attempt. Tried Celexa, Trileptal, Lamictal, Lexapro,ZyprexaandDepakote. Depakote and Lexapro caused diarrhea.    Psychiatric Specialty Exam: Physical Exam  Review of Systems  Weight 150 lb (68 kg).There is no height or weight on file to calculate  BMI.  General Appearance: NA  Eye Contact:  NA  Speech:  Normal Rate  Volume:  Decreased  Mood:  Dysphoric  Affect:  NA  Thought Process:  Descriptions of Associations: Intact  Orientation:  Full (Time, Place, and Person)  Thought Content:  Rumination  Suicidal Thoughts:  No  Homicidal Thoughts:  No  Memory:  Immediate;   Good Recent;   Fair Remote;   Fair  Judgement:  Intact  Insight:  Present  Psychomotor Activity:  NA  Concentration:  Concentration: Fair and Attention Span: Fair  Recall:  [employer] of Knowledge:  Fair  Language:  Good  Akathisia:  No  Handed:  Right  AIMS (if indicated):     Assets:  Communication Skills Desire for Improvement Housing  ADL's:  Intact  Cognition:  WNL  Sleep:   either to much or too less      Assessment and Plan: Bipolar disorder type I.  Generalized anxiety disorder.  Discussed multiple symptoms of depression and anxiety.  He is reluctant to increase the dose but now agreed to see a therapist to address his symptoms which is related to his previous work.  We discussed safety concerns and anytime having active suicidal thoughts or homicidal thought that he need to call in one-on-one.  Patient has no side effects and he like to keep Lamictal 200 mg daily, Klonopin 1 mg at bedtime and Wellbutrin XL 300 mg daily.  He will schedule his therapist appointment with his previous counselor.  He like to keep  appointment in 6 months.  However I recommend to call us back if he feels symptoms are worsening.  His PHQ is 9.  Follow-up in 6 months.  Follow Up Instructions:    I discussed the assessment and treatment plan with the patient. The patient was provided an opportunity to ask questions and all were answered. The patient agreed with the plan and demonstrated an understanding of the instructions.   The patient was advised to call back or seek an in-person evaluation if the symptoms worsen or if the condition fails to improve as  anticipated.  I provided 17 minutes of non-face-to-face time during this encounter.   Kathlee Nations, MD

## 2020-04-26 ENCOUNTER — Other Ambulatory Visit: Payer: Self-pay | Admitting: Infectious Disease

## 2020-04-26 DIAGNOSIS — R110 Nausea: Secondary | ICD-10-CM

## 2020-07-20 ENCOUNTER — Other Ambulatory Visit: Payer: Self-pay

## 2020-07-20 DIAGNOSIS — B20 Human immunodeficiency virus [HIV] disease: Secondary | ICD-10-CM

## 2020-07-20 MED ORDER — BIKTARVY 50-200-25 MG PO TABS: 1.0000 | ORAL_TABLET | Freq: Every day | ORAL | 6 refills | Status: DC

## 2020-07-23 IMAGING — CR DG CHEST 2V
2 series · 2 of 2 positions shown · non-contrast
Comparison: None.

CLINICAL DATA: Slight shortness of breath today.

EXAM:
CHEST - 2 VIEW

[chest pa]
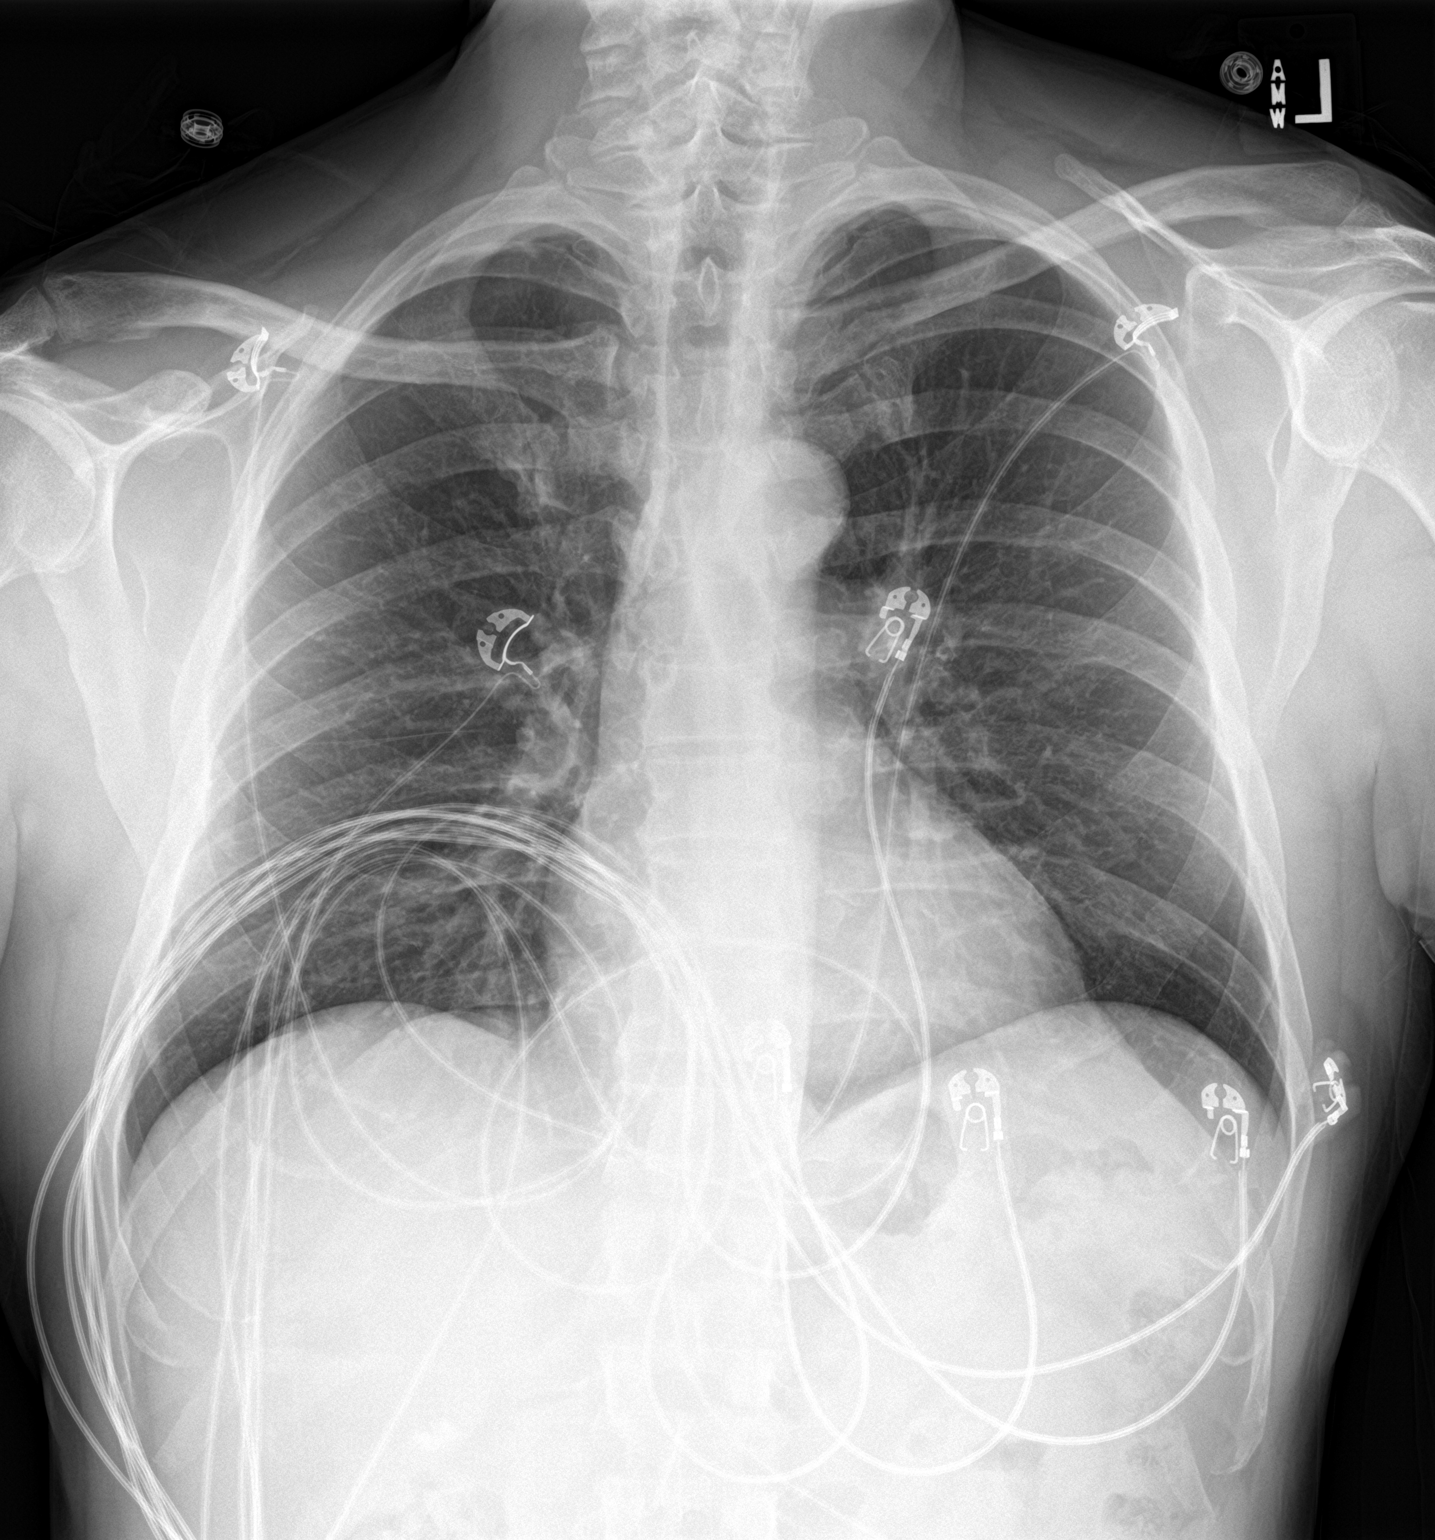

[chest lat]
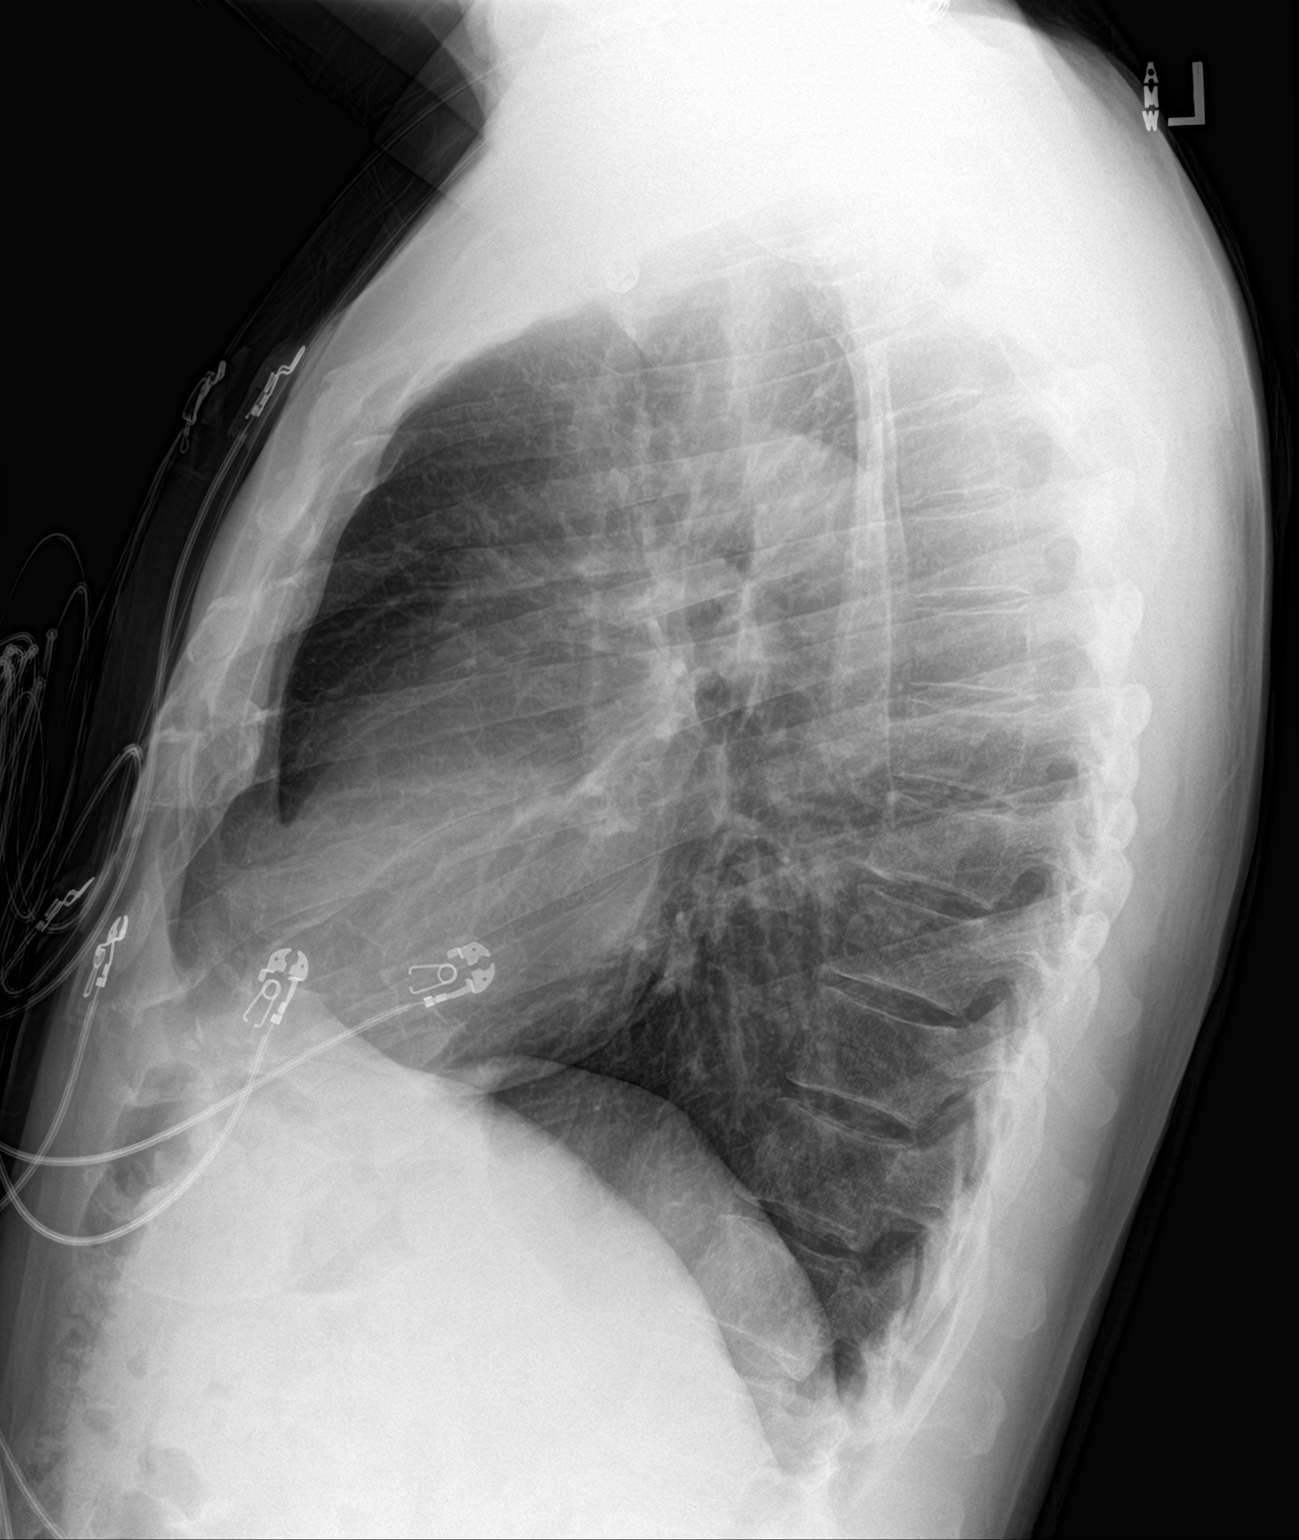

[2 of 2 positions shown; findings below may reference images not displayed]

FINDINGS: Normal sized heart. Clear lungs. Minimal scoliosis.
IMPRESSION: No acute abnormality.

## 2020-09-22 ENCOUNTER — Other Ambulatory Visit: Payer: Self-pay

## 2020-09-22 DIAGNOSIS — I10 Essential (primary) hypertension: Secondary | ICD-10-CM

## 2020-09-22 MED ORDER — AMLODIPINE BESYLATE 10 MG PO TABS: 10.0000 mg | ORAL_TABLET | Freq: Every day | ORAL | 5 refills | Status: DC

## 2020-10-04 ENCOUNTER — Encounter (HOSPITAL_COMMUNITY): Payer: Self-pay | Admitting: Psychiatry

## 2020-10-04 ENCOUNTER — Telehealth (INDEPENDENT_AMBULATORY_CARE_PROVIDER_SITE_OTHER): Payer: Medicare Other | Admitting: Psychiatry

## 2020-10-04 ENCOUNTER — Other Ambulatory Visit: Payer: Self-pay

## 2020-10-04 ENCOUNTER — Other Ambulatory Visit (HOSPITAL_COMMUNITY): Payer: Self-pay | Admitting: Psychiatry

## 2020-10-04 DIAGNOSIS — F319 Bipolar disorder, unspecified: Secondary | ICD-10-CM | POA: Diagnosis not present

## 2020-10-04 DIAGNOSIS — F411 Generalized anxiety disorder: Secondary | ICD-10-CM

## 2020-10-04 MED ORDER — CLONAZEPAM 1 MG PO TABS
1.0000 mg | ORAL_TABLET | Freq: Every day | ORAL | 1 refills | Status: DC
Start: 2020-10-04 — End: 2021-04-05

## 2020-10-04 MED ORDER — BUPROPION HCL ER (XL) 300 MG PO TB24: ORAL_TABLET | ORAL | 1 refills | Status: DC

## 2020-10-04 MED ORDER — LAMOTRIGINE 200 MG PO TABS
200.0000 mg | ORAL_TABLET | Freq: Every day | ORAL | 1 refills | Status: DC
Start: 2020-10-04 — End: 2021-04-05

## 2020-10-04 NOTE — Progress Notes (Signed)
Virtual Visit via Telephone Note  I connected with Alexis Welch on 10/04/20 at  2:40 PM EDT by telephone and verified that I am speaking with the correct person using two identifiers.  Location: Patient: Home Provider: Home Office   I discussed the limitations, risks, security and privacy concerns of performing an evaluation and management service by telephone and the availability of in person appointments. I also discussed with the patient that there may be a patient responsible charge related to this service. The patient expressed understanding and agreed to proceed.   History of Present Illness: Patient is evaluated by phone session.  He is taking his medication and he is still sometimes feel restless symptoms of depression when he feels lack of motivation, energy and now lately he is sometimes forgetful.  He ruminates about his task but denies any mania, psychosis, hallucination.  He feels the medicine is helping and he does not feel hopelessness or having any suicidal thoughts.  His energy level is fair.  His appetite is okay.  He reported his weight is stable.  We have talked again reducing the dose of the medication if he feels any of the medicine making him tired but he is reluctant to cut down.  He has no rash, itching tremors or shakes.  He has not seen his PCP as his PCP is not retired but he is going to make an appointment with his new PCP before the end of this year.  He has not resumed therapy because he cannot afford frequent visits and therapy at this time.  He like to follow up in 6 months.  Past Psychiatric History: Reviewed. H/O anxiety and bipolar disorder.  No h/o inpatient or any suicidal attempt.  Tried Celexa, Trileptal, Lamictal, Lexapro, Zyprexa and Depakote.  Depakote and Lexapro caused diarrhea.       Psychiatric Specialty Exam: Physical Exam  Review of Systems  Weight 155 lb (70.3 kg).There is no height or weight on file to calculate BMI.  General Appearance: NA   Eye Contact:  NA  Speech:  Slow  Volume:  Decreased  Mood:  Dysphoric  Affect:  NA  Thought Process:  Descriptions of Associations: Intact  Orientation:  Full (Time, Place, and Person)  Thought Content:  Rumination  Suicidal Thoughts:  No  Homicidal Thoughts:  No  Memory:  Immediate;   Good Recent;   Fair Remote;   Fair  Judgement:  Intact  Insight:  Shallow  Psychomotor Activity:  NA  Concentration:  Concentration: Fair and Attention Span: Fair  Recall:  [employer] of Knowledge:  Fair  Language:  Good  Akathisia:  No  Handed:  Right  AIMS (if indicated):     Assets:  Communication Skills Desire for Improvement Housing  ADL's:  Intact  Cognition:  WNL  Sleep:         Assessment and Plan: Bipolar disorder type I.  Generalized anxiety disorder.  Discussed residual symptoms and medication side effects.  Patient reluctant to cut down the dose of the medication.  I recommend he should consider getting a neurology evaluation for neurocognitive testing if he feels having forgetfulness, lack of motivation to do things.  He will see his PCP before the end of this year and he will discuss to get a referral to see neurology.  I offered him to [facility] but patient preferred to have his PCP sent referral to [facility].  I recommend to call us back if  there is any question or any concern.  Patient like to keep his current medication.  Continue Klonopin 1 mg at bedtime, Wellbutrin XL 300 mg daily and Lamictal 200 mg daily.  He has no rash, itching tremors or shakes.  Follow-up in 6 months as per patient request.  Follow Up Instructions:    I discussed the assessment and treatment plan with the patient. The patient was provided an opportunity to ask questions and all were answered. The patient agreed with the plan and demonstrated an understanding of the instructions.   The patient was advised to call back or seek an in-person evaluation if the symptoms  worsen or if the condition fails to improve as anticipated.  I provided 19 minutes of non-face-to-face time during this encounter.   Kathlee Nations, MD

## 2020-10-20 ENCOUNTER — Other Ambulatory Visit: Payer: Self-pay

## 2020-10-20 DIAGNOSIS — R110 Nausea: Secondary | ICD-10-CM

## 2020-10-20 MED ORDER — ONDANSETRON HCL 4 MG PO TABS: ORAL_TABLET | ORAL | 1 refills | Status: DC

## 2020-11-29 ENCOUNTER — Other Ambulatory Visit (HOSPITAL_COMMUNITY): Payer: Self-pay

## 2020-11-29 ENCOUNTER — Telehealth: Payer: Self-pay

## 2020-11-29 NOTE — Telephone Encounter (Signed)
[facility] Patient Advocate Encounter   I was successful in securing patient a $ 7500.00 grant from [employer] to provide copayment coverage for Biktarvy.  The patient's out of pocket cost will be 0.00 monthly.     I have spoken with the patient.          Dates of Eligibility: 11/29/20 through 02/12/21  Patient knows to call the office with questions or concerns.  Ileene Patrick, [facility] Patient [facility] Phone: [phone] Fax:  [phone]

## 2021-02-15 ENCOUNTER — Other Ambulatory Visit: Payer: Medicare Other

## 2021-03-02 ENCOUNTER — Encounter: Payer: Medicare Other | Admitting: Infectious Disease

## 2021-03-07 ENCOUNTER — Other Ambulatory Visit: Payer: Self-pay

## 2021-03-07 ENCOUNTER — Ambulatory Visit: Payer: Medicare Other | Admitting: Infectious Disease

## 2021-03-07 ENCOUNTER — Ambulatory Visit (INDEPENDENT_AMBULATORY_CARE_PROVIDER_SITE_OTHER): Payer: Medicare Other

## 2021-03-07 VITALS — BP 144/74 | HR 74 | Temp 98.1°F | Wt 153.0 lb

## 2021-03-07 DIAGNOSIS — Z23 Encounter for immunization: Secondary | ICD-10-CM

## 2021-03-07 DIAGNOSIS — E785 Hyperlipidemia, unspecified: Secondary | ICD-10-CM

## 2021-03-07 DIAGNOSIS — I2510 Atherosclerotic heart disease of native coronary artery without angina pectoris: Secondary | ICD-10-CM

## 2021-03-07 DIAGNOSIS — I10 Essential (primary) hypertension: Secondary | ICD-10-CM

## 2021-03-07 DIAGNOSIS — B20 Human immunodeficiency virus [HIV] disease: Secondary | ICD-10-CM

## 2021-03-07 DIAGNOSIS — F4310 Post-traumatic stress disorder, unspecified: Secondary | ICD-10-CM | POA: Diagnosis not present

## 2021-03-07 NOTE — Progress Notes (Signed)
° °  Covid-19 Vaccination Clinic  Name:  Shaul Trautman    MRN: [id number] DOB: Feb 27, 1960  03/07/2021  Mr. Fogleman was observed post Covid-19 immunization for 15 minutes without incident. He was provided with Vaccine Information Sheet and instruction to access the V-Safe system.   Mr. Laskowski was instructed to call 911 with any severe reactions post vaccine: Difficulty breathing  Swelling of face and throat  A fast heartbeat  A bad rash all over body  Dizziness and weakness   Immunizations Administered     Name Date Dose VIS Date Route   Pfizer Covid-19 Vaccine Bivalent Booster 03/07/2021  2:30 PM 0.3 mL 10/13/2020 Intramuscular   Manufacturer: [place]   Lot: [id number]   [place]: [phone]

## 2021-03-07 NOTE — Progress Notes (Deleted)
° °  Covid-19 Vaccination Clinic  Name:  Alexis Welch    MRN: [id number] DOB: 08-22-1960  03/07/2021  [name] was observed post Covid-19 immunization for {COVID Vaccine Observation Times:23551} without incident. He was provided with Vaccine Information Sheet and instruction to access the V-Safe system.   [name] was instructed to call 911 with any severe reactions post vaccine: Difficulty breathing  Swelling of face and throat  A fast heartbeat  A bad rash all over body  Dizziness and weakness    *** Covid vaccine administration is NOT RECORDED.  Must document administration and refresh note before signing ***

## 2021-03-07 NOTE — Progress Notes (Signed)
Chief complaint: Follow-up for HIV disease on medications  Subjective:    Patient ID: Alexis Welch, male    DOB: 1961-01-29, 61 y.o.   MRN: [id number]  HPI  Alexis Welch is a 61 y.o. male who is doing superbly well on BIKTARVY. He is under the care of [name] for his psychiatric conditions.  He is doing great from psychiatric standpoint and [name] is now seeing him once yearly.  I will check labs on him today and have him RTC in a year.   He also does have comorbid coronary disease hypertension hyperlipidemia.  Tyran has always had a primary care physician but currently the practice where he was receiving care in [facility] no longer has his provider and he needs to be assigned a new provider.  He is continue to follow with [name].  There are some question about whether he needed to have one of his psychiatric medications dose reduced due to him having fatigue.  He also has been recommended to have neurocognitive testing.  He said that he did not have it done himself but he went with his sister to his sister's appointment and tried to basically have his own testing done by seeing how he responded to the questions that we are asking her.       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  Family History  Problem Relation Age of Onset   Cancer Father        colon cancer   Alcohol abuse Father    Dementia Father    Heart disease Maternal Uncle    Alcohol abuse Maternal Uncle    Depression Mother    Physical abuse Mother    Bipolar disorder  Sister    Schizophrenia Sister    OCD Sister    Alcohol abuse Brother    Drug abuse Brother    Seizures Brother    Alcohol abuse Maternal Aunt    Depression Maternal Aunt    Alcohol abuse Paternal Uncle    Depression Paternal Uncle       Social History   Socioeconomic History   Marital status: Single    Spouse name: Not on file   Number of children: Not on file   Years of education: Not on file   Highest education level: Not on file  Occupational History   Not on file  Tobacco Use   Smoking status: Never   Smokeless tobacco: Never  Vaping Use   Vaping Use: Never used  Substance and Sexual Activity   Alcohol use: No    Alcohol/week: 0.0 standard drinks   Drug use: Yes    Frequency: 3.0 times per week    Types: Marijuana   Sexual activity: Not Currently    Partners: Male    Birth control/protection: Condom    Comment: declined condoms  Other Topics Concern   Not on file  Social History Narrative   Not on file   Social Determinants of Radio broadcast assistant  Strain: Not on file  Food Insecurity: Not on file  Transportation Needs: Not on file  Physical Activity: Not on file  Stress: Not on file  Social Connections: Not on file    No Known Allergies   Current Outpatient Medications:    amLODipine (NORVASC) 10 MG tablet, Take 1 tablet (10 mg total) by mouth daily., Disp: 30 tablet, Rfl: 5   atorvastatin (LIPITOR) 20 MG tablet, TAKE 1 TABLET BY MOUTH DAILY, Disp: 90 tablet, Rfl: 4   bictegravir-emtricitabine-tenofovir AF (BIKTARVY) 50-200-25 MG TABS tablet, Take 1 tablet by mouth daily., Disp: 30 tablet, Rfl: 6   buPROPion (WELLBUTRIN XL) 300 MG 24 hr tablet, TAKE 1 TABLET BY MOUTH DAILY, Disp: 90 tablet, Rfl: 1   clonazePAM (KLONOPIN) 1 MG tablet, Take 1 tablet (1 mg total) by mouth at bedtime., Disp: 90 tablet, Rfl: 1   lamoTRIgine (LAMICTAL) 200 MG tablet, Take 1 tablet (200 mg total) by mouth daily., Disp: 90 tablet, Rfl: 1   loratadine (CLARITIN) 10 MG  tablet, Take 10 mg by mouth as needed for allergies. (Patient not taking: Reported on 02/19/2020), Disp: , Rfl:    metoprolol tartrate (LOPRESSOR) 25 MG tablet, Take 12.5 mg by mouth 2 (two) times daily., Disp: , Rfl:    ondansetron (ZOFRAN) 4 MG tablet, TAKE 1 TABLET(4 MG) BY MOUTH THREE TIMES DAILY AS NEEDED FOR NAUSEA, Disp: 60 tablet, Rfl: 1   triamcinolone ointment (KENALOG) 0.5 %, Apply topically. (Patient not taking: Reported on 02/19/2020), Disp: , Rfl:    .Review of Systems  Constitutional:  Negative for activity change, appetite change, chills, diaphoresis, fatigue, fever and unexpected weight change.  HENT:  Negative for congestion, rhinorrhea, sinus pressure, sneezing, sore throat and trouble swallowing.   Eyes:  Negative for photophobia and visual disturbance.  Respiratory:  Negative for cough, chest tightness, shortness of breath, wheezing and stridor.   Cardiovascular:  Negative for chest pain, palpitations and leg swelling.  Gastrointestinal:  Negative for abdominal distention, abdominal pain, anal bleeding, blood in stool, constipation, diarrhea, nausea and vomiting.  Genitourinary:  Negative for difficulty urinating, dysuria, flank pain and hematuria.  Musculoskeletal:  Negative for arthralgias, back pain, gait problem, joint swelling and myalgias.  Skin:  Negative for color change, pallor, rash and wound.  Neurological:  Negative for dizziness, tremors, weakness and light-headedness.  Hematological:  Negative for adenopathy. Does not bruise/bleed easily.  Psychiatric/Behavioral:  Negative for agitation, behavioral problems, confusion, decreased concentration, dysphoric mood and sleep disturbance.       Objective:   Physical Exam Constitutional:      Appearance: He is well-developed.  HENT:     Head: Normocephalic and atraumatic.  Eyes:     Conjunctiva/sclera: Conjunctivae normal.  Cardiovascular:     Rate and Rhythm: Normal rate and regular rhythm.  Pulmonary:      Effort: Pulmonary effort is normal. No respiratory distress.     Breath sounds: No wheezing.  Abdominal:     General: There is no distension.     Palpations: Abdomen is soft.  Musculoskeletal:        General: No tenderness. Normal range of motion.     Cervical back: Normal range of motion and neck supple.  Skin:    General: Skin is warm and dry.     Coloration: Skin is not pale.     Findings: No erythema or rash.  Neurological:     Mental Status: He is alert and oriented to person, place, and time.  Assessment & Plan:   HIV disease:  I am checking a viral load and CD4 count today.  I am continue his Biktarvy prescription.  Bipolar depression anxiety PTSD: Continue to follow with [name]  Coronary disease: He will continue on his metoprolol aspirin and Lipitor.   Hyperlipidemia: We will continue Lipitor I am checking a lipid panel    Hypertension continue amlodipine metoprolol  Fatigue: We will defer to psychiatry with regards to his medications.  Vaccine counseling recommended that he receive updated COVID 19 vaccine and flu shot which she got today.  He will need a Prevnar 20 next time he comes to clinic.

## 2021-03-08 LAB — T-HELPER CELL (CD4) - (RCID CLINIC ONLY)
CD4 % Helper T Cell: 39 % (ref 33–65)
CD4 T Cell Abs: 940 /uL (ref 400–1790)

## 2021-03-09 LAB — COMPLETE METABOLIC PANEL WITH GFR
AG Ratio: 1.8 (calc) (ref 1.0–2.5)
ALT: 25 U/L (ref 9–46)
AST: 19 U/L (ref 10–35)
Albumin: 4.3 g/dL (ref 3.6–5.1)
Alkaline phosphatase (APISO): 87 U/L (ref 35–144)
BUN: 12 mg/dL (ref 7–25)
CO2: 32 mmol/L (ref 20–32)
Calcium: 9.6 mg/dL (ref 8.6–10.3)
Chloride: 104 mmol/L (ref 98–110)
Creat: 1.26 mg/dL (ref 0.70–1.35)
Globulin: 2.4 g/dL (calc) (ref 1.9–3.7)
Glucose, Bld: 113 mg/dL — ABNORMAL HIGH (ref 65–99)
Potassium: 3.8 mmol/L (ref 3.5–5.3)
Sodium: 142 mmol/L (ref 135–146)
Total Bilirubin: 1.1 mg/dL (ref 0.2–1.2)
Total Protein: 6.7 g/dL (ref 6.1–8.1)
eGFR: 64 mL/min/{1.73_m2} (ref >=60)

## 2021-03-09 LAB — CBC WITH DIFFERENTIAL/PLATELET
Absolute Monocytes: 566 cells/uL (ref 200–950)
Basophils Absolute: 21 cells/uL (ref 0–200)
Basophils Relative: 0.3 %
Eosinophils Absolute: 41 cells/uL (ref 15–500)
Eosinophils Relative: 0.6 %
HCT: 43.8 % (ref 38.5–50.0)
Hemoglobin: 15.3 g/dL (ref 13.2–17.1)
Lymphs Abs: 2450 cells/uL (ref 850–3900)
MCH: 32.4 pg (ref 27.0–33.0)
MCHC: 34.9 g/dL (ref 32.0–36.0)
MCV: 92.8 fL (ref 80.0–100.0)
MPV: 11.1 fL (ref 7.5–12.5)
Monocytes Relative: 8.2 %
Neutro Abs: 3823 cells/uL (ref 1500–7800)
Neutrophils Relative %: 55.4 %
Platelets: 186 10*3/uL (ref 140–400)
RBC: 4.72 10*6/uL (ref 4.20–5.80)
RDW: 11.8 % (ref 11.0–15.0)
Total Lymphocyte: 35.5 %
WBC: 6.9 10*3/uL (ref 3.8–10.8)

## 2021-03-09 LAB — LIPID PANEL
Cholesterol: 160 mg/dL (ref <200)
HDL: 55 mg/dL (ref >=40)
LDL Cholesterol (Calc): 79 mg/dL (calc)
Non-HDL Cholesterol (Calc): 105 mg/dL (calc) (ref <130)
Total CHOL/HDL Ratio: 2.9 (calc) (ref <5.0)
Triglycerides: 164 mg/dL — ABNORMAL HIGH (ref <150)

## 2021-03-09 LAB — HIV-1 RNA QUANT-NO REFLEX-BLD
HIV 1 RNA Quant: NOT DETECTED {copies}/mL
HIV-1 RNA Quant, Log: NOT DETECTED {Log_copies}/mL

## 2021-03-09 LAB — SYPHILIS: RPR W/REFLEX TO RPR TITER AND TREPONEMAL ANTIBODIES, TRADITIONAL SCREENING AND DIAGNOSIS ALGORITHM: RPR Ser Ql: NONREACTIVE

## 2021-03-18 ENCOUNTER — Other Ambulatory Visit (HOSPITAL_COMMUNITY): Payer: Self-pay | Admitting: Psychiatry

## 2021-03-18 DIAGNOSIS — F319 Bipolar disorder, unspecified: Secondary | ICD-10-CM

## 2021-03-18 DIAGNOSIS — F411 Generalized anxiety disorder: Secondary | ICD-10-CM

## 2021-03-21 ENCOUNTER — Other Ambulatory Visit: Payer: Self-pay

## 2021-03-21 DIAGNOSIS — I10 Essential (primary) hypertension: Secondary | ICD-10-CM

## 2021-03-21 MED ORDER — AMLODIPINE BESYLATE 10 MG PO TABS: 10.0000 mg | ORAL_TABLET | Freq: Every day | ORAL | 5 refills | Status: DC

## 2021-03-31 ENCOUNTER — Other Ambulatory Visit (HOSPITAL_COMMUNITY): Payer: Self-pay | Admitting: Psychiatry

## 2021-03-31 DIAGNOSIS — F411 Generalized anxiety disorder: Secondary | ICD-10-CM

## 2021-04-04 ENCOUNTER — Telehealth (HOSPITAL_COMMUNITY): Payer: Self-pay

## 2021-04-04 ENCOUNTER — Telehealth (HOSPITAL_COMMUNITY): Payer: Medicare Other | Admitting: Psychiatry

## 2021-04-04 NOTE — Telephone Encounter (Signed)
Medication refill - Telephone call with patient, after he left a message he is in need of a new Clonazepam order. Informed pt of scheduled appt with [name] on tomorrow, 04/05/21 at 2:40 pm. Pt. stated he had not gotten a reminder so he was glad this nurse was calling him back to inform and stated he will need prescriptions at new appointment.  Pt. Stated he has not gotten MyChart set up yet so he would like [name] to call him for the appt at 2:40pm as he has another MD appt in [place] at 3:15 pm on the 21st as well.  Agreed to send this request to [name] for patient.

## 2021-04-05 ENCOUNTER — Telehealth (HOSPITAL_BASED_OUTPATIENT_CLINIC_OR_DEPARTMENT_OTHER): Payer: Medicare Other | Admitting: Psychiatry

## 2021-04-05 ENCOUNTER — Other Ambulatory Visit: Payer: Self-pay

## 2021-04-05 ENCOUNTER — Encounter (HOSPITAL_COMMUNITY): Payer: Self-pay | Admitting: Psychiatry

## 2021-04-05 DIAGNOSIS — F319 Bipolar disorder, unspecified: Secondary | ICD-10-CM

## 2021-04-05 DIAGNOSIS — F411 Generalized anxiety disorder: Secondary | ICD-10-CM | POA: Diagnosis not present

## 2021-04-05 MED ORDER — BUPROPION HCL ER (XL) 300 MG PO TB24: ORAL_TABLET | ORAL | 1 refills | Status: DC

## 2021-04-05 MED ORDER — LAMOTRIGINE 200 MG PO TABS: 200.0000 mg | ORAL_TABLET | Freq: Every day | ORAL | 1 refills | Status: DC

## 2021-04-05 MED ORDER — CLONAZEPAM 1 MG PO TABS: 1.0000 mg | ORAL_TABLET | Freq: Every day | ORAL | 1 refills | Status: DC

## 2021-04-05 NOTE — Progress Notes (Signed)
Virtual Visit via Telephone Note  I connected with Alexis Welch on 04/05/21 at  2:40 PM EST by telephone and verified that I am speaking with the correct person using two identifiers.  Location: Patient: Home Provider: Home Office   I discussed the limitations, risks, security and privacy concerns of performing an evaluation and management service by telephone and the availability of in person appointments. I also discussed with the patient that there may be a patient responsible charge related to this service. The patient expressed understanding and agreed to proceed.   History of Present Illness: Patient is evaluated by phone session.  He is compliant with Klonopin Lamictal and Wellbutrin.  He reported no side effects including any tremors or shakes or rash.  He does get bouts of depression usually when dark outside but able to get out of it to keep himself busy.  Once a month he go to his friend's antique shop where he usually sell things and that helps him a lot.  He denies any mania, psychosis.  He denies any highs and lows and he feels his mood is stable.  He denies any crying spells or any feeling of hopelessness or worthlessness.  His appetite is okay and his weight is stable.  Recently he had a visit with his infectious disease doctor and had labs.  His labs are stable.  He denies any suicidal thoughts.  He like to keep his current medication which is working well.  Currently he is not in any therapy.  Like to follow up in 6 months.  Past Psychiatric History: H/O anxiety and bipolar disorder.  No h/o inpatient or any suicidal attempt.  Tried Celexa, Trileptal, Lamictal, Lexapro, Zyprexa and Depakote.  Depakote and Lexapro caused diarrhea.       Recent Results (from the past 2160 hour(s))  T-helper cell (CD4)- ([facility] clinic only)     Status: None   Collection Time: 03/07/21  1:39 PM  Result Value Ref Range   CD4 T Cell Abs 940 400 - 1,790 /uL   CD4 % Helper T Cell 39 33 - 65 %     Comment: Performed at [facility], [place] [address] [place], [place] [address]  HIV-1 RNA quant-no reflex-bld     Status: None   Collection Time: 03/07/21  2:10 PM  Result Value Ref Range   HIV 1 RNA Quant Not Detected Copies/mL   HIV-1 RNA Quant, Log Not Detected Log cps/mL    Comment: . Reference Range:                           Not Detected     copies/mL                           Not Detected Log copies/mL . [name] The test was performed using Real-Time Polymerase Chain Reaction. . . Reportable Range: 20 copies/mL to 10,000,000 copies/mL (1.30 Log copies/mL to 7.00 Log copies/mL). .   RPR     Status: None   Collection Time: 03/07/21  2:10 PM  Result Value Ref Range   RPR Ser Ql NON-REACTIVE NON-REACTIVE  COMPLETE METABOLIC PANEL WITH GFR     Status: Abnormal   Collection Time: 03/07/21  2:10 PM  Result Value Ref Range   Glucose, Bld 113 (H) 65 - 99 mg/dL    Comment: .  Fasting reference interval . For someone without known diabetes, a glucose value between 100 and 125 mg/dL is consistent with prediabetes and should be confirmed with a follow-up test. .    BUN 12 7 - 25 mg/dL   Creat 1.26 0.70 - 1.35 mg/dL   eGFR 64 > OR = 60 mL/min/1.104m    Comment: The eGFR is based on the CKD-EPI 2021 equation. To calculate  the new eGFR from a previous Creatinine or Cystatin C result, go to https://www.kidney.org/professionals/ kdoqi/gfr%5Fcalculator    BUN/Creatinine Ratio NOT APPLICABLE 6 - 22 (calc)   Sodium 142 135 - 146 mmol/L   Potassium 3.8 3.5 - 5.3 mmol/L   Chloride 104 98 - 110 mmol/L   CO2 32 20 - 32 mmol/L   Calcium 9.6 8.6 - 10.3 mg/dL   Total Protein 6.7 6.1 - 8.1 g/dL   Albumin 4.3 3.6 - 5.1 g/dL   Globulin 2.4 1.9 - 3.7 g/dL (calc)   AG Ratio 1.8 1.0 - 2.5 (calc)   Total Bilirubin 1.1 0.2 - 1.2 mg/dL   Alkaline phosphatase (APISO) 87 35 - 144 U/L   AST 19 10 - 35 U/L   ALT 25 9 - 46 U/L  Lipid panel     Status: Abnormal    Collection Time: 03/07/21  2:10 PM  Result Value Ref Range   Cholesterol 160 <200 mg/dL   HDL 55 > OR = 40 mg/dL   Triglycerides 164 (H) <150 mg/dL   LDL Cholesterol (Calc) 79 mg/dL (calc)    Comment: Reference range: <100 . Desirable range <100 mg/dL for primary prevention;   <70 mg/dL for patients with CHD or diabetic patients  with > or = 2 CHD risk factors. .Marland KitchenLDL-C is now calculated using the Martin-Hopkins  calculation, which is a validated novel method providing  better accuracy than the Friedewald equation in the  estimation of LDL-C.  MCresenciano Genreet al. JAnnamaria Helling 27510;258(52: [phone]  (http://education.[employer].com/faq/FAQ164)    Total CHOL/HDL Ratio 2.9 <5.0 (calc)   Non-HDL Cholesterol (Calc) 105 <130 mg/dL (calc)    Comment: For patients with diabetes plus 1 major ASCVD risk  factor, treating to a non-HDL-C goal of <100 mg/dL  (LDL-C of <70 mg/dL) is considered a therapeutic  option.   CBC with Differential/Platelet     Status: None   Collection Time: 03/07/21  2:10 PM  Result Value Ref Range   WBC 6.9 3.8 - 10.8 Thousand/uL   RBC 4.72 4.20 - 5.80 Million/uL   Hemoglobin 15.3 13.2 - 17.1 g/dL   HCT 43.8 38.5 - 50.0 %   MCV 92.8 80.0 - 100.0 fL   MCH 32.4 27.0 - 33.0 pg   MCHC 34.9 32.0 - 36.0 g/dL   RDW 11.8 11.0 - 15.0 %   Platelets 186 140 - 400 Thousand/uL   MPV 11.1 7.5 - 12.5 fL   Neutro Abs 3,823 1,500 - 7,800 cells/uL   Lymphs Abs 2,450 850 - 3,900 cells/uL   Absolute Monocytes 566 200 - 950 cells/uL   Eosinophils Absolute 41 15 - 500 cells/uL   Basophils Absolute 21 0 - 200 cells/uL   Neutrophils Relative % 55.4 %   Total Lymphocyte 35.5 %   Monocytes Relative 8.2 %   Eosinophils Relative 0.6 %   Basophils Relative 0.3 %     Psychiatric Specialty Exam: Physical Exam  Review of Systems  Weight 153 lb (69.4 kg).There is no height or weight on file to calculate BMI.  General Appearance:  NA  Eye Contact:  NA  Speech:  Normal Rate  Volume:   Normal  Mood:  Dysphoric  Affect:  NA  Thought Process:  Goal Directed  Orientation:  Full (Time, Place, and Person)  Thought Content:  Rumination  Suicidal Thoughts:  No  Homicidal Thoughts:  No  Memory:  Immediate;   Good Recent;   Good Remote;   Good  Judgement:  Intact  Insight:  Good  Psychomotor Activity:  NA  Concentration:  Concentration: Fair and Attention Span: Fair  Recall:  Good  Fund of Knowledge:  Good  Language:  Good  Akathisia:  No  Handed:  Right  AIMS (if indicated):     Assets:  Communication Skills Desire for Improvement Housing Resilience Social Support Transportation  ADL's:  Intact  Cognition:  WNL  Sleep:   sometimes restless sleep      Assessment and Plan: Bipolar disorder type I.  Generalized anxiety disorder.  Reviewed blood work results.  Patient admitted there are some nights when he does not sleep well.  I recommend he can try melatonin over-the-counter up to 5 mg.  He does not want to change the medication since his symptoms are manageable.  Discussed medication side effects and benefits.  Continue Wellbutrin XL 300 mg daily, Lyrica 2 g daily and Klonopin 1 mg at bedtime.  Recommended to call us back if is any change or any concern.  Follow-up plans.  Follow Up Instructions:    I discussed the assessment and treatment plan with the patient. The patient was provided an opportunity to ask questions and all were answered. The patient agreed with the plan and demonstrated an understanding of the instructions.   The patient was advised to call back or seek an in-person evaluation if the symptoms worsen or if the condition fails to improve as anticipated.  I provided 17 minutes of non-face-to-face time during this encounter.   Kathlee Nations, MD

## 2021-04-08 ENCOUNTER — Other Ambulatory Visit (HOSPITAL_COMMUNITY): Payer: Self-pay | Admitting: Psychiatry

## 2021-04-08 DIAGNOSIS — F411 Generalized anxiety disorder: Secondary | ICD-10-CM

## 2021-04-11 ENCOUNTER — Telehealth (HOSPITAL_COMMUNITY): Payer: Self-pay

## 2021-04-11 NOTE — Telephone Encounter (Signed)
Thanks

## 2021-04-11 NOTE — Telephone Encounter (Signed)
Medication management - Telephone call with pt, after he left a voice message his [facility] was stating they had not received the Clonazepam order [name] e-scribed on 04/05/21.  Spoke to a pharmacy tech and then pharmacist, Michiel Cowboy who stated they were not showing the e-scribed order from 04/05/21.  Provided collateral the verbal order from that date it was authorized for Clonazepam 1 mg, one by mouth at bedtime, #90 with 1 refill from [name]. Informed patient they were filling this order now and not sure why the e-scribed order from 04/05/21 was not received.  Patient appreciative of assistance.

## 2021-04-21 ENCOUNTER — Other Ambulatory Visit: Payer: Self-pay

## 2021-04-21 DIAGNOSIS — B20 Human immunodeficiency virus [HIV] disease: Secondary | ICD-10-CM

## 2021-04-21 MED ORDER — BIKTARVY 50-200-25 MG PO TABS: 1.0000 | ORAL_TABLET | Freq: Every day | ORAL | 2 refills | Status: DC

## 2021-07-13 ENCOUNTER — Other Ambulatory Visit: Payer: Self-pay

## 2021-07-13 DIAGNOSIS — B20 Human immunodeficiency virus [HIV] disease: Secondary | ICD-10-CM

## 2021-07-13 MED ORDER — BIKTARVY 50-200-25 MG PO TABS: 1.0000 | ORAL_TABLET | Freq: Every day | ORAL | 2 refills | Status: DC

## 2021-08-30 ENCOUNTER — Telehealth: Payer: Self-pay

## 2021-08-30 DIAGNOSIS — E785 Hyperlipidemia, unspecified: Secondary | ICD-10-CM

## 2021-08-30 MED ORDER — ATORVASTATIN CALCIUM 20 MG PO TABS: 20.0000 mg | ORAL_TABLET | Freq: Every day | ORAL | 1 refills | Status: DC

## 2021-08-30 NOTE — Telephone Encounter (Signed)
Patient called office requesting refill on Atorvastatin 20 mg. States that [facility] does not have a prescription on file.  Per Dr.  Tommy Medal okay to refill prescription. Leatrice Jewels, RMA

## 2021-09-06 ENCOUNTER — Ambulatory Visit: Payer: Medicare Other

## 2021-09-06 ENCOUNTER — Ambulatory Visit: Payer: Medicare Other | Admitting: Infectious Disease

## 2021-09-09 ENCOUNTER — Telehealth: Payer: Self-pay

## 2021-09-09 NOTE — Telephone Encounter (Signed)
Received faxed refill request for patient Amlodipine 10 mg tablets. Patient is being followed up Dr. Drucilla Schmidt with Family medicine at [place]. Will forward message to MD to advise on refill or if rx request should be filled by PCP. Leatrice Jewels, RMA

## 2021-09-12 NOTE — Telephone Encounter (Signed)
Called [facility] to have refill request forwarded to PCP. Verbalized understanding.  Leatrice Jewels, RMA

## 2021-09-28 ENCOUNTER — Ambulatory Visit: Payer: Medicare Other | Admitting: Infectious Disease

## 2021-09-28 ENCOUNTER — Ambulatory Visit: Payer: Medicare Other

## 2021-10-03 ENCOUNTER — Telehealth (HOSPITAL_BASED_OUTPATIENT_CLINIC_OR_DEPARTMENT_OTHER): Payer: Medicare Other | Admitting: Psychiatry

## 2021-10-03 ENCOUNTER — Other Ambulatory Visit (HOSPITAL_COMMUNITY)
Admission: RE | Admit: 2021-10-03 | Discharge: 2021-10-03 | Disposition: A | Discharge status: Home / Self Care | Payer: Medicare Other | Source: Ambulatory Visit | Attending: Infectious Disease | Admitting: Infectious Disease

## 2021-10-03 ENCOUNTER — Encounter (HOSPITAL_COMMUNITY): Payer: Self-pay | Admitting: Psychiatry

## 2021-10-03 ENCOUNTER — Encounter: Payer: Self-pay | Admitting: Infectious Disease

## 2021-10-03 ENCOUNTER — Ambulatory Visit: Payer: Medicare Other

## 2021-10-03 ENCOUNTER — Ambulatory Visit: Payer: Medicare Other | Admitting: Infectious Disease

## 2021-10-03 ENCOUNTER — Other Ambulatory Visit: Payer: Self-pay

## 2021-10-03 VITALS — Wt 154.0 lb

## 2021-10-03 VITALS — BP 125/73 | HR 56 | Resp 16 | Ht 67.0 in | Wt 154.0 lb

## 2021-10-03 DIAGNOSIS — I2510 Atherosclerotic heart disease of native coronary artery without angina pectoris: Secondary | ICD-10-CM | POA: Diagnosis not present

## 2021-10-03 DIAGNOSIS — B20 Human immunodeficiency virus [HIV] disease: Secondary | ICD-10-CM | POA: Diagnosis present

## 2021-10-03 DIAGNOSIS — I10 Essential (primary) hypertension: Secondary | ICD-10-CM

## 2021-10-03 DIAGNOSIS — F319 Bipolar disorder, unspecified: Secondary | ICD-10-CM

## 2021-10-03 DIAGNOSIS — Z7185 Encounter for immunization safety counseling: Secondary | ICD-10-CM

## 2021-10-03 DIAGNOSIS — Z21 Asymptomatic human immunodeficiency virus [HIV] infection status: Secondary | ICD-10-CM

## 2021-10-03 DIAGNOSIS — F411 Generalized anxiety disorder: Secondary | ICD-10-CM | POA: Diagnosis not present

## 2021-10-03 MED ORDER — CLONAZEPAM 1 MG PO TABS: 1.0000 mg | ORAL_TABLET | Freq: Every day | ORAL | 0 refills | Status: DC

## 2021-10-03 MED ORDER — ESCITALOPRAM OXALATE 5 MG PO TABS: 5.0000 mg | ORAL_TABLET | Freq: Every day | ORAL | 1 refills | Status: DC

## 2021-10-03 MED ORDER — LAMOTRIGINE 200 MG PO TABS: 200.0000 mg | ORAL_TABLET | Freq: Every day | ORAL | 0 refills | Status: DC

## 2021-10-03 MED ORDER — BUPROPION HCL ER (XL) 300 MG PO TB24: ORAL_TABLET | ORAL | 0 refills | Status: DC

## 2021-10-03 MED ORDER — BIKTARVY 50-200-25 MG PO TABS: 1.0000 | ORAL_TABLET | Freq: Every day | ORAL | 11 refills | Status: DC

## 2021-10-03 NOTE — Progress Notes (Signed)
Chief complaint: Follow-up for HIV disease on medications  Subjective:    Patient ID: Alexis Welch, male    DOB: [birth year], 61 y.o.   MRN: [id number]  HPI  Alexis Welch is a 62 y.o. male who is doing superbly well on BIKTARVY. He is under the care of [name] for his psychiatric conditions.  He is established primary care with [facility].       Past Medical History:  Diagnosis Date   Anxiety    Arthritis    Bipolar I disorder, most recent episode depressed ([facility]) 11/23/2014   CAD (coronary artery disease) 02/19/2020   Depression    Dizzy 03/15/2018   Hemangioma 04/28/2015   HIV disease ([facility])    Hypertension    Nausea 02/19/2020   PAC (premature atrial contraction) 03/15/2018   Testicular pain 04/13/2016   Tremor 09/07/2015   Tremor due to multiple drugs 11/23/2014    No past surgical history on file.  Family History  Problem Relation Age of Onset   Cancer Father        colon cancer   Alcohol abuse Father    Dementia Father    Heart disease Maternal Uncle    Alcohol abuse Maternal Uncle    Depression Mother    Physical abuse Mother    Bipolar disorder Sister    Schizophrenia Sister    OCD Sister    Alcohol abuse Brother    Drug abuse Brother    Seizures Brother    Alcohol abuse Maternal Aunt    Depression Maternal Aunt    Alcohol abuse Paternal Uncle    Depression Paternal Uncle       Social History   Socioeconomic History   Marital status: Single    Spouse name: Not on file   Number of children: Not on file   Years of education: Not on file   Highest education level: Not on file  Occupational History   Not on file  Tobacco Use   Smoking status: Never   Smokeless tobacco: Never  Vaping Use   Vaping Use: Never used  Substance and Sexual Activity   Alcohol use: No    Alcohol/week: 0.0 standard drinks of alcohol   Drug use: Yes    Frequency: 3.0 times per week    Types: Marijuana   Sexual activity: Not Currently    Partners: Male    Birth  control/protection: Condom    Comment: declined condoms  Other Topics Concern   Not on file  Social History Narrative   Not on file   Social Determinants of Health   Financial Resource Strain: Not on file  Food Insecurity: Not on file  Transportation Needs: Not on file  Physical Activity: Not on file  Stress: Not on file  Social Connections: Not on file    No Known Allergies   Current Outpatient Medications:    amLODipine (NORVASC) 10 MG tablet, Take 1 tablet (10 mg total) by mouth daily., Disp: 30 tablet, Rfl: 5   atorvastatin (LIPITOR) 20 MG tablet, Take 1 tablet (20 mg total) by mouth daily., Disp: 90 tablet, Rfl: 1   bictegravir-emtricitabine-tenofovir AF (BIKTARVY) 50-200-25 MG TABS tablet, Take 1 tablet by mouth daily., Disp: 30 tablet, Rfl: 2   buPROPion (WELLBUTRIN XL) 300 MG 24 hr tablet, TAKE 1 TABLET BY MOUTH DAILY, Disp: 90 tablet, Rfl: 1   clonazePAM (KLONOPIN) 1 MG tablet, Take 1 tablet (1 mg total) by mouth at bedtime., Disp: 90 tablet, Rfl: 1  lamoTRIgine (LAMICTAL) 200 MG tablet, Take 1 tablet (200 mg total) by mouth daily., Disp: 90 tablet, Rfl: 1   loratadine (CLARITIN) 10 MG tablet, Take 10 mg by mouth as needed for allergies., Disp: , Rfl:    metoprolol tartrate (LOPRESSOR) 25 MG tablet, Take 12.5 mg by mouth 2 (two) times daily., Disp: , Rfl:    ondansetron (ZOFRAN) 4 MG tablet, TAKE 1 TABLET(4 MG) BY MOUTH THREE TIMES DAILY AS NEEDED FOR NAUSEA, Disp: 60 tablet, Rfl: 1   triamcinolone ointment (KENALOG) 0.5 %, Apply topically., Disp: , Rfl:    .Review of Systems  Constitutional:  Negative for activity change, appetite change, chills, diaphoresis, fatigue, fever and unexpected weight change.  HENT:  Negative for congestion, rhinorrhea, sinus pressure, sneezing, sore throat and trouble swallowing.   Eyes:  Negative for photophobia and visual disturbance.  Respiratory:  Negative for cough, chest tightness, shortness of breath, wheezing and stridor.    Cardiovascular:  Negative for chest pain, palpitations and leg swelling.  Gastrointestinal:  Negative for abdominal distention, abdominal pain, anal bleeding, blood in stool, constipation, diarrhea, nausea and vomiting.  Genitourinary:  Negative for difficulty urinating, dysuria, flank pain and hematuria.  Musculoskeletal:  Negative for arthralgias, back pain, gait problem, joint swelling and myalgias.  Skin:  Negative for color change, pallor, rash and wound.  Neurological:  Negative for dizziness, tremors, weakness and light-headedness.  Hematological:  Negative for adenopathy. Does not bruise/bleed easily.  Psychiatric/Behavioral:  Negative for agitation, behavioral problems, confusion, decreased concentration, dysphoric mood and sleep disturbance.        Objective:   Physical Exam Constitutional:      Appearance: He is well-developed.  HENT:     Head: Normocephalic and atraumatic.  Eyes:     Conjunctiva/sclera: Conjunctivae normal.  Cardiovascular:     Rate and Rhythm: Normal rate and regular rhythm.  Pulmonary:     Effort: Pulmonary effort is normal. No respiratory distress.     Breath sounds: No wheezing.  Abdominal:     General: There is no distension.     Palpations: Abdomen is soft.  Musculoskeletal:        General: No tenderness. Normal range of motion.     Cervical back: Normal range of motion and neck supple.  Skin:    General: Skin is warm and dry.     Coloration: Skin is not pale.     Findings: No erythema or rash.  Neurological:     General: No focal deficit present.     Mental Status: He is alert and oriented to person, place, and time.  Psychiatric:        Mood and Affect: Mood normal.        Behavior: Behavior normal.        Thought Content: Thought content normal.        Judgment: Judgment normal.           Assessment & Plan:    HIV disease:  I am rechecking a viral load CD4 count CBC CMP  We will continue his Biktarvy  prescription  Bipolar disease, depression, PTSD: continuing to follow with [name] and he is seeing him today and on lamictal, wellbutrin,    CAD: Continue atorvastatin metoprolol amlodipine hypertension continue amlodipine   Axsain counseling I recommend to get updated COVID-19 and flu shot this fall

## 2021-10-03 NOTE — Progress Notes (Signed)
Virtual Visit via Telephone Note  I connected with Alexis Welch on 10/03/21 at  2:00 PM EDT by telephone and verified that I am speaking with the correct person using two identifiers.  Location: Patient: In Car Provider: Home Office   I discussed the limitations, risks, security and privacy concerns of performing an evaluation and management service by telephone and the availability of in person appointments. I also discussed with the patient that there may be a patient responsible charge related to this service. The patient expressed understanding and agreed to proceed.   History of Present Illness: Patient is evaluated by phone session.  He is in the car.  He had a visit with his PCP and he had blood drawn but the results are still pending.  Patient noticed lately he has been feeling tired, exhausted and has no energy to do things.  He does not feel motivated and there are nights when he sleeps more than 12 hours.  Denies any suicidal thoughts or homicidal thoughts but he denies any mania, psychosis, anger or any mood swings.  He admitted chronic financial stress but lately he feels it is taking his motivation to do things.  He is compliant with Klonopin, Lamictal and Wellbutrin.  He has no rash, itching tremors or shakes.  His appetite is okay and his weight is unchanged from the past.  He is not currently in any therapy.  He wants to try the medication that he took in the past that helped his motivation but also he had nausea.  Now his nausea is well-controlled and he like to go back on same medication.  Past Psychiatric History: H/O anxiety and bipolar disorder.  No h/o inpatient or any suicidal attempt.  Tried Celexa, Trileptal, Lamictal, Lexapro, Zyprexa and Depakote.  Depakote and Lexapro caused diarrhea.        Psychiatric Specialty Exam: Physical Exam  Review of Systems  Weight 154 lb (69.9 kg).There is no height or weight on file to calculate BMI.  General Appearance: NA  Eye  Contact:  NA  Speech:  Slow  Volume:  Decreased  Mood:   tired  Affect:  NA  Thought Process:  Descriptions of Associations: Intact  Orientation:  Full (Time, Place, and Person)  Thought Content:  Rumination  Suicidal Thoughts:  No  Homicidal Thoughts:  No  Memory:  Immediate;   Good Recent;   Good Remote;   Fair  Judgement:  Intact  Insight:  Present  Psychomotor Activity:  NA  Concentration:  Concentration: Fair and Attention Span: Fair  Recall:  Good  Fund of Knowledge:  Good  Language:  Good  Akathisia:  No  Handed:  Right  AIMS (if indicated):     Assets:  Communication Skills Desire for Improvement Housing Transportation  ADL's:  Intact  Cognition:  WNL  Sleep:   too much      Assessment and Plan: Bipolar disorder type I.  Generalized anxiety disorder.  His PHQ is less than 10.  I reviewed his notes.  He has taken the Lexapro in the past but it was stopped due to excessive nausea.  Now he feels nausea is under control and he wants to try a low-dose Lexapro.  We will keep the Wellbutrin XL 300 mg daily, Lamictal 200 mg daily, Klonopin 1 mg at bedtime and we will start Lexapro 5 mg daily.  I encouraged to call us back if is any question, concern or if he feels worsening of the symptoms or having  any side effects from the Lexapro.  Patient agreed to give [place] a call back.  We will follow up in 2 months.  Follow Up Instructions:    I discussed the assessment and treatment plan with the patient. The patient was provided an opportunity to ask questions and all were answered. The patient agreed with the plan and demonstrated an understanding of the instructions.   The patient was advised to call back or seek an in-person evaluation if the symptoms worsen or if the condition fails to improve as anticipated.  Collaboration of Care: Other provider involved in patient's care AEB notes are available in epic to review.  Patient/Guardian was advised Release of Information must  be obtained prior to any record release in order to collaborate their care with an outside provider. Patient/Guardian was advised if they have not already done so to contact the registration department to sign all necessary forms in order for [place] to release information regarding their care.   Consent: Patient/Guardian gives verbal consent for treatment and assignment of benefits for services provided during this visit. Patient/Guardian expressed understanding and agreed to proceed.    I provided 23 minutes of non-face-to-face time during this encounter.   Kathlee Nations, MD

## 2021-10-04 LAB — T-HELPER CELLS (CD4) COUNT (NOT AT ARMC)
CD4 % Helper T Cell: 38 % (ref 33–65)
CD4 T Cell Abs: 831 /uL (ref 400–1790)

## 2021-10-04 LAB — URINE CYTOLOGY ANCILLARY ONLY
Chlamydia: NEGATIVE
Comment: NEGATIVE
Comment: NORMAL
Neisseria Gonorrhea: NEGATIVE

## 2021-10-06 LAB — RPR: RPR Ser Ql: NONREACTIVE

## 2021-10-06 LAB — COMPLETE METABOLIC PANEL WITH GFR
AG Ratio: 1.9 (calc) (ref 1.0–2.5)
ALT: 29 U/L (ref 9–46)
AST: 20 U/L (ref 10–35)
Albumin: 4.6 g/dL (ref 3.6–5.1)
Alkaline phosphatase (APISO): 106 U/L (ref 35–144)
BUN: 11 mg/dL (ref 7–25)
CO2: 29 mmol/L (ref 20–32)
Calcium: 9.6 mg/dL (ref 8.6–10.3)
Chloride: 104 mmol/L (ref 98–110)
Creat: 1.3 mg/dL (ref 0.70–1.35)
Globulin: 2.4 g/dL (calc) (ref 1.9–3.7)
Glucose, Bld: 96 mg/dL (ref 65–99)
Potassium: 4 mmol/L (ref 3.5–5.3)
Sodium: 142 mmol/L (ref 135–146)
Total Bilirubin: 0.9 mg/dL (ref 0.2–1.2)
Total Protein: 7 g/dL (ref 6.1–8.1)
eGFR: 62 mL/min/{1.73_m2} (ref >=60)

## 2021-10-06 LAB — CBC WITH DIFFERENTIAL/PLATELET
Absolute Monocytes: 578 cells/uL (ref 200–950)
Basophils Absolute: 38 cells/uL (ref 0–200)
Basophils Relative: 0.5 %
Eosinophils Absolute: 99 cells/uL (ref 15–500)
Eosinophils Relative: 1.3 %
HCT: 45.5 % (ref 38.5–50.0)
Hemoglobin: 15.8 g/dL (ref 13.2–17.1)
Lymphs Abs: 2295 cells/uL (ref 850–3900)
MCH: 33.3 pg — ABNORMAL HIGH (ref 27.0–33.0)
MCHC: 34.7 g/dL (ref 32.0–36.0)
MCV: 95.8 fL (ref 80.0–100.0)
MPV: 10.9 fL (ref 7.5–12.5)
Monocytes Relative: 7.6 %
Neutro Abs: 4590 cells/uL (ref 1500–7800)
Neutrophils Relative %: 60.4 %
Platelets: 201 10*3/uL (ref 140–400)
RBC: 4.75 10*6/uL (ref 4.20–5.80)
RDW: 12.4 % (ref 11.0–15.0)
Total Lymphocyte: 30.2 %
WBC: 7.6 10*3/uL (ref 3.8–10.8)

## 2021-10-06 LAB — LIPID PANEL
Cholesterol: 156 mg/dL (ref <200)
HDL: 61 mg/dL (ref >=40)
LDL Cholesterol (Calc): 74 mg/dL (calc)
Non-HDL Cholesterol (Calc): 95 mg/dL (calc) (ref <130)
Total CHOL/HDL Ratio: 2.6 (calc) (ref <5.0)
Triglycerides: 131 mg/dL (ref <150)

## 2021-10-06 LAB — HIV-1 RNA QUANT-NO REFLEX-BLD
HIV 1 RNA Quant: NOT DETECTED Copies/mL
HIV-1 RNA Quant, Log: NOT DETECTED Log cps/mL

## 2021-10-11 ENCOUNTER — Telehealth: Payer: Self-pay

## 2021-10-11 DIAGNOSIS — R110 Nausea: Secondary | ICD-10-CM

## 2021-10-11 MED ORDER — ONDANSETRON HCL 4 MG PO TABS: ORAL_TABLET | ORAL | 1 refills | Status: DC

## 2021-10-11 NOTE — Telephone Encounter (Signed)
Spoke with Legrand Como, he is not sure if his Phillips Odor is causing the nausea or if it is more a symptom of PTSD. He has contacted his PCP and requested that they take over future refills.   Beryle Flock, RN

## 2021-10-11 NOTE — Addendum Note (Signed)
Addended by: Lucie Leather D on: 10/11/2021 01:44 PM   Modules accepted: Orders

## 2021-10-11 NOTE — Telephone Encounter (Signed)
Patient called requesting refill of ondansetron. He would prefer Dr. Tommy Medal fill this as it's his ART that causes nausea. Will route to provider.    Beryle Flock, RN

## 2021-10-31 ENCOUNTER — Other Ambulatory Visit: Payer: Self-pay

## 2021-10-31 ENCOUNTER — Telehealth: Payer: Self-pay

## 2021-10-31 DIAGNOSIS — B20 Human immunodeficiency virus [HIV] disease: Secondary | ICD-10-CM

## 2021-10-31 MED ORDER — BIKTARVY 50-200-25 MG PO TABS: 1.0000 | ORAL_TABLET | Freq: Every day | ORAL | 11 refills | Status: DC

## 2021-10-31 NOTE — Telephone Encounter (Signed)
Patient called regarding medication delivery status. Staff call pharmacy for patient and pharmacy stated they have trying to reach patient. Pharmacy will reach out again. Patient made aware via Mychart. Alexis Welch

## 2021-11-25 ENCOUNTER — Other Ambulatory Visit: Payer: Self-pay

## 2021-11-25 DIAGNOSIS — I10 Essential (primary) hypertension: Secondary | ICD-10-CM

## 2021-11-25 MED ORDER — AMLODIPINE BESYLATE 10 MG PO TABS: 10.0000 mg | ORAL_TABLET | Freq: Every day | ORAL | 1 refills | Status: AC

## 2021-12-07 ENCOUNTER — Telehealth (HOSPITAL_BASED_OUTPATIENT_CLINIC_OR_DEPARTMENT_OTHER): Payer: Medicare Other | Admitting: Psychiatry

## 2021-12-07 ENCOUNTER — Encounter (HOSPITAL_COMMUNITY): Payer: Self-pay | Admitting: Psychiatry

## 2021-12-07 DIAGNOSIS — F411 Generalized anxiety disorder: Secondary | ICD-10-CM | POA: Diagnosis not present

## 2021-12-07 DIAGNOSIS — F319 Bipolar disorder, unspecified: Secondary | ICD-10-CM

## 2021-12-07 MED ORDER — LAMOTRIGINE 100 MG PO TABS: 100.0000 mg | ORAL_TABLET | Freq: Every day | ORAL | 0 refills | Status: DC

## 2021-12-07 MED ORDER — CLONAZEPAM 1 MG PO TABS: 1.0000 mg | ORAL_TABLET | Freq: Every day | ORAL | 0 refills | Status: DC

## 2021-12-07 MED ORDER — BUPROPION HCL ER (XL) 300 MG PO TB24: ORAL_TABLET | ORAL | 0 refills | Status: DC

## 2021-12-07 NOTE — Progress Notes (Signed)
Virtual Visit via Telephone Note  I connected with Alexis Welch on 12/07/21 at 10:00 AM EDT by telephone and verified that I am speaking with the correct person using two identifiers.  Location: Patient: Home Provider: Home Office   I discussed the limitations, risks, security and privacy concerns of performing an evaluation and management service by telephone and the availability of in person appointments. I also discussed with the patient that there may be a patient responsible charge related to this service. The patient expressed understanding and agreed to proceed.   History of Present Illness: Patient is evaluated by phone session.  He reported that taking the Lamictal 150 for past few weeks and he noticed improvement in his energy level.  It is unclear why he is taking 150 because the past 2 years he is prescribed Lamictal 200.  On further questioning he admitted must be using the old prescription but now he is feeling better he like to even consider lowering the Lamictal dose.  He reported things are going well.  He feels good about himself.  He is walking, cooking and able to do a lot of things which he had never thought about it.  He has neuropathy and sometimes that causes insomnia but able to go back to sleep.  He is getting 6 to 8 hours.  He is happy his niece is going to college.  He is compliant with Lamictal but taking previous medication with lower dose, he is compliant with Klonopin and Wellbutrin.  He has no rash, itching, tremors or shakes.  He denies any anhedonia or any suicidal thoughts.  He denies any mania, psychosis or any impulsive behavior.  Sometimes he uses a lot of humor in his conversation.  We have started Lexapro 5 mg but he is not taking regularly.  He reported medicines were making him very tired and he was sleeping too much but now lowering the dose had helped him to do things.  He has not checked his weight but reported his weight is unchanged from the past.  His  appetite is okay.  He has no nausea.  Recently had a blood work and labs are stable.  Past Psychiatric History: H/O anxiety and bipolar disorder.  No h/o inpatient or any suicidal attempt.  Tried Celexa, Trileptal, Lamictal, Lexapro, Zyprexa and Depakote.  Depakote and Lexapro caused diarrhea.     Recent Results (from the past 2160 hour(s))  T-helper cells (CD4) count (not at [facility])     Status: None   Collection Time: 10/03/21  9:25 AM  Result Value Ref Range   CD4 T Cell Abs 831 400 - 1,790 /uL   CD4 % Helper T Cell 38 33 - 65 %    Comment: Performed at [facility], [place] [address], [place], [place] [address]  Urine cytology ancillary only     Status: None   Collection Time: 10/03/21  9:25 AM  Result Value Ref Range   Neisseria Gonorrhea Negative    Chlamydia Negative    Comment Normal Reference Ranger Chlamydia - Negative    Comment      Normal Reference Range Neisseria Gonorrhea - Negative  HIV-1 RNA quant-no reflex-bld     Status: None   Collection Time: 10/03/21  9:39 AM  Result Value Ref Range   HIV 1 RNA Quant Not Detected Copies/mL   HIV-1 RNA Quant, Log Not Detected Log cps/mL    Comment: . Reference Range:  Not Detected     copies/mL                           Not Detected Log copies/mL . [name] The test was performed using Real-Time Polymerase Chain Reaction. . . Reportable Range: 20 copies/mL to 10,000,000 copies/mL (1.30 Log copies/mL to 7.00 Log copies/mL). .   COMPLETE METABOLIC PANEL WITH GFR     Status: None   Collection Time: 10/03/21  9:39 AM  Result Value Ref Range   Glucose, Bld 96 65 - 99 mg/dL    Comment: .            Fasting reference interval .    BUN 11 7 - 25 mg/dL   Creat 1.30 0.70 - 1.35 mg/dL   eGFR 62 > OR = 60 mL/min/1.63m   BUN/Creatinine Ratio SEE NOTE: 6 - 22 (calc)    Comment:    Not Reported: BUN and Creatinine are within    reference range. .    Sodium 142 135 - 146 mmol/L   Potassium  4.0 3.5 - 5.3 mmol/L   Chloride 104 98 - 110 mmol/L   CO2 29 20 - 32 mmol/L   Calcium 9.6 8.6 - 10.3 mg/dL   Total Protein 7.0 6.1 - 8.1 g/dL   Albumin 4.6 3.6 - 5.1 g/dL   Globulin 2.4 1.9 - 3.7 g/dL (calc)   AG Ratio 1.9 1.0 - 2.5 (calc)   Total Bilirubin 0.9 0.2 - 1.2 mg/dL   Alkaline phosphatase (APISO) 106 35 - 144 U/L   AST 20 10 - 35 U/L   ALT 29 9 - 46 U/L  CBC with Differential/Platelet     Status: Abnormal   Collection Time: 10/03/21  9:39 AM  Result Value Ref Range   WBC 7.6 3.8 - 10.8 Thousand/uL   RBC 4.75 4.20 - 5.80 Million/uL   Hemoglobin 15.8 13.2 - 17.1 g/dL   HCT 45.5 38.5 - 50.0 %   MCV 95.8 80.0 - 100.0 fL   MCH 33.3 (H) 27.0 - 33.0 pg   MCHC 34.7 32.0 - 36.0 g/dL   RDW 12.4 11.0 - 15.0 %   Platelets 201 140 - 400 Thousand/uL   MPV 10.9 7.5 - 12.5 fL   Neutro Abs 4,590 1,500 - 7,800 cells/uL   Lymphs Abs 2,295 850 - 3,900 cells/uL   Absolute Monocytes 578 200 - 950 cells/uL   Eosinophils Absolute 99 15 - 500 cells/uL   Basophils Absolute 38 0 - 200 cells/uL   Neutrophils Relative % 60.4 %   Total Lymphocyte 30.2 %   Monocytes Relative 7.6 %   Eosinophils Relative 1.3 %   Basophils Relative 0.5 %  RPR     Status: None   Collection Time: 10/03/21  9:39 AM  Result Value Ref Range   RPR Ser Ql NON-REACTIVE NON-REACTIVE  Lipid panel     Status: None   Collection Time: 10/03/21  9:39 AM  Result Value Ref Range   Cholesterol 156 <200 mg/dL   HDL 61 > OR = 40 mg/dL   Triglycerides 131 <150 mg/dL   LDL Cholesterol (Calc) 74 mg/dL (calc)    Comment: Reference range: <100 . Desirable range <100 mg/dL for primary prevention;   <70 mg/dL for patients with CHD or diabetic patients  with > or = 2 CHD risk factors. .Marland KitchenLDL-C is now calculated using the Martin-Hopkins  calculation, which is a validated novel method providing  better accuracy than the Friedewald equation in the  estimation of LDL-C.  Cresenciano Genre et al. Annamaria Helling. 1027;253(66): 2061-2068   (http://education.[employer].com/faq/FAQ164)    Total CHOL/HDL Ratio 2.6 <5.0 (calc)   Non-HDL Cholesterol (Calc) 95 <130 mg/dL (calc)    Comment: For patients with diabetes plus 1 major ASCVD risk  factor, treating to a non-HDL-C goal of <100 mg/dL  (LDL-C of <70 mg/dL) is considered a therapeutic  option.        Psychiatric Specialty Exam: Physical Exam  Review of Systems  Neurological:  Positive for numbness.    Weight 154 lb (69.9 kg).There is no height or weight on file to calculate BMI.  General Appearance: NA  Eye Contact:  NA  Speech:   fast but clear  Volume:  Normal  Mood:  Euthymic  Affect:  NA  Thought Process:  Descriptions of Associations: Intact  Orientation:  Full (Time, Place, and Person)  Thought Content:  Rumination  Suicidal Thoughts:  No  Homicidal Thoughts:  No  Memory:  Immediate;   Good Recent;   Good Remote;   Good  Judgement:  Intact  Insight:  Present  Psychomotor Activity:  NA  Concentration:  Concentration: Fair and Attention Span: Fair  Recall:  Good  Fund of Knowledge:  Good  Language:  Good  Akathisia:  No  Handed:  Right  AIMS (if indicated):     Assets:  Communication Skills Desire for Improvement Housing Transportation  ADL's:  Intact  Cognition:  WNL  Sleep:   6-8 hrs      Assessment and Plan: Bipolar disorder type I.  Generalized anxiety disorder.  Patient is using previous Lamictal bottle.  He is taking 150 mg and noticed improvement in his energy level.  He is even considering to cut down the dose further because he reported his psychosocial stressors are less intense and he is managing the life better than before.  I agree we can try lowering the dose and I recommend should not use the old prescription and we will write a new prescription Lamictal 100 mg to take daily.  Discontinue Lexapro since he is not taking regularly.  Continue Klonopin 1 mg at bedtime and Wellbutrin XL 300 mg daily.  Discussed medication side  effects and benefits.  Recommend to call us back if is any question or any concern.  Follow-up in 3 months.  Follow Up Instructions:    I discussed the assessment and treatment plan with the patient. The patient was provided an opportunity to ask questions and all were answered. The patient agreed with the plan and demonstrated an understanding of the instructions.   The patient was advised to call back or seek an in-person evaluation if the symptoms worsen or if the condition fails to improve as anticipated.  Collaboration of Care: Other provider involved in patient's care AEB notes are available in epic to review.  Patient/Guardian was advised Release of Information must be obtained prior to any record release in order to collaborate their care with an outside provider. Patient/Guardian was advised if they have not already done so to contact the registration department to sign all necessary forms in order for [place] to release information regarding their care.   Consent: Patient/Guardian gives verbal consent for treatment and assignment of benefits for services provided during this visit. Patient/Guardian expressed understanding and agreed to proceed.    I provided 24 minutes of non-face-to-face time during this encounter.   Kathlee Nations, MD

## 2022-01-18 ENCOUNTER — Other Ambulatory Visit (HOSPITAL_COMMUNITY): Payer: Self-pay

## 2022-02-24 ENCOUNTER — Other Ambulatory Visit (HOSPITAL_COMMUNITY): Payer: Self-pay | Admitting: Psychiatry

## 2022-02-24 DIAGNOSIS — F411 Generalized anxiety disorder: Secondary | ICD-10-CM

## 2022-02-24 DIAGNOSIS — F319 Bipolar disorder, unspecified: Secondary | ICD-10-CM

## 2022-03-08 ENCOUNTER — Encounter (HOSPITAL_COMMUNITY): Payer: Self-pay | Admitting: Psychiatry

## 2022-03-08 ENCOUNTER — Telehealth (HOSPITAL_BASED_OUTPATIENT_CLINIC_OR_DEPARTMENT_OTHER): Payer: Medicare Other | Admitting: Psychiatry

## 2022-03-08 ENCOUNTER — Other Ambulatory Visit (HOSPITAL_COMMUNITY): Payer: Self-pay | Admitting: *Deleted

## 2022-03-08 DIAGNOSIS — F411 Generalized anxiety disorder: Secondary | ICD-10-CM

## 2022-03-08 DIAGNOSIS — F319 Bipolar disorder, unspecified: Secondary | ICD-10-CM | POA: Diagnosis not present

## 2022-03-08 MED ORDER — CLONAZEPAM 1 MG PO TABS: 1.0000 mg | ORAL_TABLET | Freq: Every evening | ORAL | 0 refills | Status: DC | PRN

## 2022-03-08 MED ORDER — BUPROPION HCL ER (XL) 300 MG PO TB24: ORAL_TABLET | ORAL | 0 refills | Status: DC

## 2022-03-08 MED ORDER — LAMOTRIGINE 100 MG PO TABS: 100.0000 mg | ORAL_TABLET | Freq: Every day | ORAL | 0 refills | Status: DC

## 2022-03-08 NOTE — Progress Notes (Signed)
Virtual Visit via Telephone Note  I connected with Alexis Welch on 03/08/22 at 10:00 AM EST by telephone and verified that I am speaking with the correct person using two identifiers.  Location: Patient: Home Provider: Home Office   I discussed the limitations, risks, security and privacy concerns of performing an evaluation and management service by telephone and the availability of in person appointments. I also discussed with the patient that there may be a patient responsible charge related to this service. The patient expressed understanding and agreed to proceed.   History of Present Illness: Patient is evaluated by phone session.  He admitted frustration with the pharmacy because not able to get the Klonopin on time.  He is getting prescription from [facility] located in [place] but they mail the prescription to his place at [facility].  He tells the past few weeks not having follow-up and and now he decided to take the Klonopin only when he needed.  He admitted struggle with insomnia and lately taking over-the-counter to help with sleep.  He is also frustrated because again pharmacy send the Lamictal 200 mg even though the dose has been reduced to 100.  He believes they are sending the prescription so they can build [employer].  He does not need the new prescription of Lamictal because he can use the left over 200 mg to take half tablet.  He reported neuropathy and sometimes tingling before to go to sleep.  He tried over-the-counter GABA that helps.  He denies any mania, agitation, hallucination, suicidal thoughts.  He reported Christmas was quiet.  He reported appetite is okay and weight is unchanged from the past.  He has no rash, itching tremors or shakes.  He is taking the Wellbutrin.  Past Psychiatric History: H/O anxiety and bipolar disorder.  No h/o inpatient or any suicidal attempt.  Tried Celexa, Trileptal, Lamictal, Lexapro, Zyprexa and Depakote.  Depakote and  Lexapro caused diarrhea.      Psychiatric Specialty Exam: Physical Exam  Review of Systems  Neurological:  Positive for numbness.    Weight 154 lb (69.9 kg).There is no height or weight on file to calculate BMI.  General Appearance: NA  Eye Contact:  NA  Speech:  Normal Rate  Volume:  Normal  Mood:  Euthymic  Affect:  NA  Thought Process:  Goal Directed  Orientation:  Full (Time, Place, and Person)  Thought Content:  WDL  Suicidal Thoughts:  No  Homicidal Thoughts:  No  Memory:  Immediate;   Good Recent;   Good Remote;   Good  Judgement:  Intact  Insight:  Present  Psychomotor Activity:  Normal  Concentration:  Concentration: Fair and Attention Span: Fair  Recall:  [place] of Knowledge:  Good  Language:  Good  Akathisia:  No  Handed:  Right  AIMS (if indicated):     Assets:  Communication Skills Desire for Improvement Housing Transportation  ADL's:  Intact  Cognition:  WNL  Sleep:   6-8 but not every night      Assessment and Plan: Bipolar disorder type I.  Generalized anxiety disorder.  Patient appears frustrated with the pharmacy [facility] in [place] who are not getting the medication on time.  I offered in person visit so he can pick up the prescription and take it to the pharmacy directly but patient refused.  He preferred virtual.  We will call the pharmacy and provide the correct dose of Lamictal 100 mg as patient is still getting  the previous dose of 200 mg.  We like to take the Klonopin only as needed since he has not taken it in a while but like to have a prescription.  We will reduce the number of pills to Klonopin 1 mg #30 to take as needed for anxiety.  Continue Wellbutrin XL 300 mg daily.  Recommended to call us back if any questions or any concerns.  Follow-up in 3 months.  He is using an over-the-counter Garba to help his neuropathy.    Follow Up Instructions:    I discussed the assessment and treatment plan with the patient. The patient was  provided an opportunity to ask questions and all were answered. The patient agreed with the plan and demonstrated an understanding of the instructions.   The patient was advised to call back or seek an in-person evaluation if the symptoms worsen or if the condition fails to improve as anticipated.  Collaboration of Care: Other provider involved in patient's care AEB notes are available in epic to review.  Patient/Guardian was advised Release of Information must be obtained prior to any record release in order to collaborate their care with an outside provider. Patient/Guardian was advised if they have not already done so to contact the registration department to sign all necessary forms in order for [place] to release information regarding their care.   Consent: Patient/Guardian gives verbal consent for treatment and assignment of benefits for services provided during this visit. Patient/Guardian expressed understanding and agreed to proceed.    I provided 22 minutes of non-face-to-face time during this encounter.   Kathlee Nations, MD

## 2022-04-06 ENCOUNTER — Other Ambulatory Visit (HOSPITAL_COMMUNITY): Payer: Self-pay

## 2022-06-07 ENCOUNTER — Telehealth (HOSPITAL_COMMUNITY): Payer: Medicare Other | Admitting: Psychiatry

## 2022-06-09 ENCOUNTER — Encounter (HOSPITAL_COMMUNITY): Payer: Self-pay | Admitting: Psychiatry

## 2022-06-09 ENCOUNTER — Telehealth (HOSPITAL_BASED_OUTPATIENT_CLINIC_OR_DEPARTMENT_OTHER): Payer: Medicare Other | Admitting: Psychiatry

## 2022-06-09 DIAGNOSIS — F319 Bipolar disorder, unspecified: Secondary | ICD-10-CM | POA: Diagnosis not present

## 2022-06-09 DIAGNOSIS — F411 Generalized anxiety disorder: Secondary | ICD-10-CM

## 2022-06-09 MED ORDER — LAMOTRIGINE 100 MG PO TABS: 100.0000 mg | ORAL_TABLET | Freq: Every day | ORAL | 0 refills | Status: DC

## 2022-06-09 NOTE — Progress Notes (Signed)
[facility] MD Virtual Progress Note   Patient Location: Home Provider Location: Home Office  I connect with patient by telephone and verified that I am speaking with correct person by using two identifiers. I discussed the limitations of evaluation and management by telemedicine and the availability of in person appointments. I also discussed with the patient that there may be a patient responsible charge related to this service. The patient expressed understanding and agreed to proceed.  Alexis Welch [id number] 62 y.o.  06/09/2022 10:03 AM  History of Present Illness:  Patient is evaluated by phone session.  He had not activated his my chart because he does not trust and worried about scam while using technology.  He reported things are doing very well since he has no more to deal with his 3 year old sister who had psychiatric illness.  Patient told he is feeling more relaxed, calm.  He stopped the Klonopin for more than 2 months ago.  He is taking Lamictal which is helping his mood.  He also stopped the Klonopin as primary care started him on temazepam 15 mg at bedtime.  He is sleeping better.  He started to feel more active, he feels that he wanted to do stuff and back in his life.  He was not happy taking the psychotropic medication because he felt that he was dependent on it.  He does try to keep himself busy and walk when weather is better.  He is close to his niece and nephew.  He admitted lot of issues and struggle with the pharmacy about the Klonopin refills.  He is glad that he does not need the Klonopin and Wellbutrin but like to keep the Lamictal.  Patient told his pharmacy is still giving the 200 mg pills but he is taking half tablet and taking only 100 mg.  He has no rash, itching, tremors or shakes.  Denies any mania, impulsive behavior, hallucination or any paranoia.  He denies any suicidal thoughts.  His anxiety is much under control since he is not dealing with his  58 year old sister.  He is still power of attorney for his sister but hoping at 1 day niece will take care of it.  Past Psychiatric History: H/O anxiety and bipolar disorder.  No h/o inpatient or any suicidal attempt.  Tried Celexa, Trileptal, Lamictal, Lexapro, Zyprexa and Depakote.  Depakote and Lexapro caused diarrhea.      Outpatient Encounter Medications as of 06/09/2022  Medication Sig   amLODipine (NORVASC) 10 MG tablet Take 1 tablet (10 mg total) by mouth daily.   atorvastatin (LIPITOR) 20 MG tablet Take 1 tablet (20 mg total) by mouth daily.   bictegravir-emtricitabine-tenofovir AF (BIKTARVY) 50-200-25 MG TABS tablet Take 1 tablet by mouth daily.   buPROPion (WELLBUTRIN XL) 300 MG 24 hr tablet TAKE 1 TABLET BY MOUTH DAILY   clonazePAM (KLONOPIN) 1 MG tablet Take 1 tablet (1 mg total) by mouth at bedtime as needed for anxiety.   escitalopram (LEXAPRO) 5 MG tablet Take 1 tablet (5 mg total) by mouth daily. (Patient not taking: Reported on 12/07/2021)   lamoTRIgine (LAMICTAL) 100 MG tablet Take 1 tablet (100 mg total) by mouth daily.   loratadine (CLARITIN) 10 MG tablet Take 10 mg by mouth as needed for allergies.   metoprolol tartrate (LOPRESSOR) 25 MG tablet Take 12.5 mg by mouth 2 (two) times daily.   ondansetron (ZOFRAN) 4 MG tablet TAKE 1 TABLET(4 MG) BY MOUTH THREE TIMES DAILY AS NEEDED FOR NAUSEA  triamcinolone ointment (KENALOG) 0.5 % Apply topically.   No facility-administered encounter medications on file as of 06/09/2022.    No results found for this or any previous visit (from the past 2160 hour(s)).   Psychiatric Specialty Exam: Physical Exam  Review of Systems  Weight 151 lb (68.5 kg).There is no height or weight on file to calculate BMI.  General Appearance: NA  Eye Contact:  NA  Speech:  Normal Rate  Volume:  Normal  Mood:  Euthymic  Affect:  NA  Thought Process:  Goal Directed  Orientation:  Full (Time, Place, and Person)  Thought Content:  WDL  Suicidal  Thoughts:  No  Homicidal Thoughts:  No  Memory:  Immediate;   Good Recent;   Good Remote;   Good  Judgement:  Good  Insight:  Present  Psychomotor Activity:  NA  Concentration:  Concentration: Fair and Attention Span: Fair  Recall:  Good  Fund of Knowledge:  Good  Language:  Good  Akathisia:  No  Handed:  Right  AIMS (if indicated):     Assets:  Communication Skills Desire for Improvement Housing Transportation  ADL's:  Intact  Cognition:  WNL  Sleep:  better with Restoril     Assessment/Plan: GAD (generalized anxiety disorder) - Plan: lamoTRIgine (LAMICTAL) 100 MG tablet  Bipolar 1 disorder (HCC) - Plan: lamoTRIgine (LAMICTAL) 100 MG tablet  I review notes from his primary care physician Dr. Driscilla Moats at [facility].  He is no longer taking Klonopin and Wellbutrin.  He is taking temazepam 15 mg which is helping his sleep.  His anxiety is better.  Discussed his psychosocial stressors which is improved.  Discussed past history and reminded if symptoms started to come back then he need to call us back.  Patient do not have active my chart and I offered to come in person visit which she agree.  He will do in person visit in 3 months.  I recommend to call us back if is any question, concern or if he feel worsening of symptoms.   Follow Up Instructions:     I discussed the assessment and treatment plan with the patient. The patient was provided an opportunity to ask questions and all were answered. The patient agreed with the plan and demonstrated an understanding of the instructions.   The patient was advised to call back or seek an in-person evaluation if the symptoms worsen or if the condition fails to improve as anticipated.    Collaboration of Care: Other provider involved in patient's care AEB notes are available in epic to review.  Patient/Guardian was advised Release of Information must be obtained prior to any record release in order to collaborate their care with an  outside provider. Patient/Guardian was advised if they have not already done so to contact the registration department to sign all necessary forms in order for [place] to release information regarding their care.   Consent: Patient/Guardian gives verbal consent for treatment and assignment of benefits for services provided during this visit. Patient/Guardian expressed understanding and agreed to proceed.     I provided 25 minutes of non face to face time during this encounter.  Note: This document was prepared by [employer] voice dictation technology and any errors that results from this process are unintentional.    Cleotis Nipper, MD 06/09/2022

## 2022-08-14 ENCOUNTER — Other Ambulatory Visit: Payer: Self-pay

## 2022-08-14 DIAGNOSIS — B20 Human immunodeficiency virus [HIV] disease: Secondary | ICD-10-CM

## 2022-08-14 MED ORDER — BIKTARVY 50-200-25 MG PO TABS: 1.0000 | ORAL_TABLET | Freq: Every day | ORAL | 0 refills | Status: DC

## 2022-09-07 ENCOUNTER — Ambulatory Visit (HOSPITAL_COMMUNITY): Payer: Medicare Other | Admitting: Psychiatry

## 2022-10-04 ENCOUNTER — Ambulatory Visit: Payer: Medicare Other

## 2022-10-04 ENCOUNTER — Other Ambulatory Visit: Payer: Self-pay

## 2022-10-04 ENCOUNTER — Ambulatory Visit: Payer: Medicare Other | Admitting: Infectious Disease

## 2022-10-04 ENCOUNTER — Encounter: Payer: Self-pay | Admitting: Infectious Disease

## 2022-10-04 ENCOUNTER — Other Ambulatory Visit (HOSPITAL_COMMUNITY)
Admission: RE | Admit: 2022-10-04 | Discharge: 2022-10-04 | Disposition: A | Discharge status: Home / Self Care | Payer: Medicare Other | Source: Ambulatory Visit | Attending: Infectious Disease | Admitting: Infectious Disease

## 2022-10-04 VITALS — BP 127/72 | HR 67 | Temp 98.1°F | Ht 68.0 in | Wt 146.0 lb

## 2022-10-04 DIAGNOSIS — B20 Human immunodeficiency virus [HIV] disease: Secondary | ICD-10-CM | POA: Diagnosis present

## 2022-10-04 DIAGNOSIS — I2510 Atherosclerotic heart disease of native coronary artery without angina pectoris: Secondary | ICD-10-CM | POA: Diagnosis not present

## 2022-10-04 DIAGNOSIS — I10 Essential (primary) hypertension: Secondary | ICD-10-CM | POA: Diagnosis not present

## 2022-10-04 DIAGNOSIS — F419 Anxiety disorder, unspecified: Secondary | ICD-10-CM | POA: Diagnosis not present

## 2022-10-04 MED ORDER — BIKTARVY 50-200-25 MG PO TABS
1.0000 | ORAL_TABLET | Freq: Every day | ORAL | 11 refills | Status: DC
Start: 2022-10-04 — End: 2022-10-17

## 2022-10-04 NOTE — Progress Notes (Signed)
Chief complaint: Follow-up for HIV disease on medications  Subjective:    Patient ID: Alexis Welch, male    DOB: 11/02/1960, 62 y.o.   MRN: [id number]  HPI  Aundray Batz is a 61 y.o. male who is doing superbly well on BIKTARVY. He is under the care of Dr. Lolly Mustache for his psychiatric conditions.  He is established primary care with [facility].   Lotanna has taken himself off of clonazepam though he is on temazepam at bedtime.  It.  He feels much better less sedated and much more engaged and happier in his outlook he is still on Lamictal.      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  Family History  Problem Relation Age of Onset   Cancer Father        colon cancer   Alcohol abuse Father    Dementia Father    Heart disease Maternal Uncle    Alcohol abuse Maternal Uncle    Depression Mother    Physical abuse Mother    Bipolar disorder Sister    Schizophrenia Sister    OCD Sister    Alcohol abuse Brother    Drug abuse Brother    Seizures Brother    Alcohol abuse Maternal Aunt    Depression Maternal Aunt    Alcohol abuse Paternal Uncle    Depression Paternal Uncle       Social History   Socioeconomic History   Marital status: Single    Spouse name: Not on file   Number of children: Not on file   Years of education: Not on file   Highest education level: Not on file  Occupational History   Not on file  Tobacco Use   Smoking status: Never   Smokeless tobacco: Never  Vaping Use   Vaping status: Never Used  Substance and Sexual Activity    Alcohol use: No    Alcohol/week: 0.0 standard drinks of alcohol   Drug use: Yes    Frequency: 3.0 times per week    Types: Marijuana   Sexual activity: Not Currently    Partners: Male    Birth control/protection: Condom    Comment: declined condoms  Other Topics Concern   Not on file  Social History Narrative   Not on file   Social Determinants of Health   Financial Resource Strain: Not on file  Food Insecurity: Not on file  Transportation Needs: Not on file  Physical Activity: Not on file  Stress: Not on file  Social Connections: Not on file    No Known Allergies   Current Outpatient Medications:    amLODipine (NORVASC) 10 MG tablet, Take 1 tablet (10 mg total) by mouth daily., Disp: 30 tablet, Rfl: 1   atorvastatin (LIPITOR) 20 MG tablet, Take 1 tablet (20 mg total) by mouth daily., Disp: 90 tablet, Rfl: 1   bictegravir-emtricitabine-tenofovir AF (BIKTARVY) 50-200-25 MG TABS tablet, Take 1 tablet by mouth daily., Disp: 30 tablet, Rfl: 0   lamoTRIgine (LAMICTAL) 100 MG tablet,  Take 1 tablet (100 mg total) by mouth daily., Disp: 90 tablet, Rfl: 0   metoprolol tartrate (LOPRESSOR) 25 MG tablet, Take 12.5 mg by mouth 2 (two) times daily., Disp: , Rfl:    temazepam (RESTORIL) 15 MG capsule, Take 15 mg by mouth at bedtime., Disp: , Rfl:    buPROPion (WELLBUTRIN XL) 300 MG 24 hr tablet, TAKE 1 TABLET BY MOUTH DAILY (Patient not taking: Reported on 06/09/2022), Disp: 90 tablet, Rfl: 0   clonazePAM (KLONOPIN) 1 MG tablet, Take 1 tablet (1 mg total) by mouth at bedtime as needed for anxiety. (Patient not taking: Reported on 06/09/2022), Disp: 30 tablet, Rfl: 0   loratadine (CLARITIN) 10 MG tablet, Take 10 mg by mouth as needed for allergies. (Patient not taking: Reported on 10/04/2022), Disp: , Rfl:    triamcinolone ointment (KENALOG) 0.5 %, Apply topically. (Patient not taking: Reported on 10/04/2022), Disp: , Rfl:    .Review of Systems  Constitutional:  Negative for activity change,  appetite change, chills, diaphoresis, fatigue, fever and unexpected weight change.  HENT:  Negative for congestion, rhinorrhea, sinus pressure, sneezing, sore throat and trouble swallowing.   Eyes:  Negative for photophobia and visual disturbance.  Respiratory:  Negative for cough, chest tightness, shortness of breath, wheezing and stridor.   Cardiovascular:  Negative for chest pain, palpitations and leg swelling.  Gastrointestinal:  Negative for abdominal distention, abdominal pain, anal bleeding, blood in stool, constipation, diarrhea, nausea and vomiting.  Genitourinary:  Negative for difficulty urinating, dysuria, flank pain and hematuria.  Musculoskeletal:  Negative for arthralgias, back pain, gait problem, joint swelling and myalgias.  Skin:  Negative for color change, pallor, rash and wound.  Neurological:  Negative for dizziness, tremors, weakness and light-headedness.  Hematological:  Negative for adenopathy. Does not bruise/bleed easily.  Psychiatric/Behavioral:  Negative for agitation, behavioral problems, confusion, decreased concentration, dysphoric mood and sleep disturbance.        Objective:   Physical Exam Constitutional:      Appearance: He is well-developed.  HENT:     Head: Normocephalic and atraumatic.  Eyes:     Conjunctiva/sclera: Conjunctivae normal.  Cardiovascular:     Rate and Rhythm: Normal rate and regular rhythm.  Pulmonary:     Effort: Pulmonary effort is normal. No respiratory distress.     Breath sounds: No wheezing.  Abdominal:     General: There is no distension.     Palpations: Abdomen is soft.  Musculoskeletal:        General: No tenderness. Normal range of motion.     Cervical back: Normal range of motion and neck supple.  Skin:    General: Skin is warm and dry.     Coloration: Skin is not pale.     Findings: No erythema or rash.  Neurological:     Mental Status: He is alert and oriented to person, place, and time.            Assessment & Plan:   HIV disease:  I will add order HIV viral load CD4 count CBC with differential CMP, RPR GC and chlamydia and I will continue  [employer] [employer],  prescription  Bipolar disease depression anxiety: He is continue to follow with Dr. Lolly Mustache.    Coronary disease continue atorvastatin metoprolol and amlodipine    CAD: Continue atorvastatin metoprolol amlodipine hypertension continue amlodipine

## 2022-10-05 LAB — URINE CYTOLOGY ANCILLARY ONLY
Chlamydia: NEGATIVE
Comment: NEGATIVE
Comment: NORMAL
Neisseria Gonorrhea: NEGATIVE

## 2022-10-06 ENCOUNTER — Telehealth: Payer: Self-pay

## 2022-10-06 LAB — COMPLETE METABOLIC PANEL WITH GFR
AG Ratio: 1.8 (calc) (ref 1.0–2.5)
ALT: 19 U/L (ref 9–46)
AST: 19 U/L (ref 10–35)
Albumin: 4.8 g/dL (ref 3.6–5.1)
Alkaline phosphatase (APISO): 101 U/L (ref 35–144)
BUN: 19 mg/dL (ref 7–25)
CO2: 30 mmol/L (ref 20–32)
Calcium: 9.8 mg/dL (ref 8.6–10.3)
Chloride: 102 mmol/L (ref 98–110)
Creat: 1.35 mg/dL (ref 0.70–1.35)
Globulin: 2.6 g/dL (ref 1.9–3.7)
Glucose, Bld: 50 mg/dL — ABNORMAL LOW (ref 65–99)
Potassium: 4.2 mmol/L (ref 3.5–5.3)
Sodium: 140 mmol/L (ref 135–146)
Total Bilirubin: 0.9 mg/dL (ref 0.2–1.2)
Total Protein: 7.4 g/dL (ref 6.1–8.1)
eGFR: 59 mL/min/{1.73_m2} — ABNORMAL LOW (ref >=60)

## 2022-10-06 LAB — CBC WITH DIFFERENTIAL/PLATELET
Absolute Monocytes: 681 {cells}/uL (ref 200–950)
Basophils Absolute: 30 {cells}/uL (ref 0–200)
Basophils Relative: 0.4 %
Eosinophils Absolute: 37 {cells}/uL (ref 15–500)
Eosinophils Relative: 0.5 %
HCT: 47.3 % (ref 38.5–50.0)
Hemoglobin: 16.6 g/dL (ref 13.2–17.1)
Lymphs Abs: 2331 cells/uL (ref 850–3900)
MCH: 32.1 pg (ref 27.0–33.0)
MCHC: 35.1 g/dL (ref 32.0–36.0)
MCV: 91.5 fL (ref 80.0–100.0)
MPV: 11.3 fL (ref 7.5–12.5)
Monocytes Relative: 9.2 %
Neutro Abs: 4322 {cells}/uL (ref 1500–7800)
Neutrophils Relative %: 58.4 %
Platelets: 208 10*3/uL (ref 140–400)
RBC: 5.17 10*6/uL (ref 4.20–5.80)
RDW: 12.6 % (ref 11.0–15.0)
Total Lymphocyte: 31.5 %
WBC: 7.4 10*3/uL (ref 3.8–10.8)

## 2022-10-06 LAB — LIPID PANEL
Cholesterol: 256 mg/dL — ABNORMAL HIGH (ref <200)
HDL: 60 mg/dL (ref >=40)
LDL Cholesterol (Calc): 167 mg/dL — ABNORMAL HIGH
Non-HDL Cholesterol (Calc): 196 mg/dL — ABNORMAL HIGH (ref <130)
Total CHOL/HDL Ratio: 4.3 (calc) (ref <5.0)
Triglycerides: 144 mg/dL (ref <150)

## 2022-10-06 LAB — HIV-1 RNA QUANT-NO REFLEX-BLD
HIV 1 RNA Quant: NOT DETECTED {copies}/mL
HIV-1 RNA Quant, Log: NOT DETECTED {Log_copies}/mL

## 2022-10-06 LAB — T-HELPER CELLS (CD4) COUNT (NOT AT ARMC)
CD4 % Helper T Cell: 35 % (ref 33–65)
CD4 T Cell Abs: 757 /uL (ref 400–1790)

## 2022-10-06 LAB — RPR: RPR Ser Ql: NONREACTIVE

## 2022-10-06 NOTE — Telephone Encounter (Signed)
-----   Message from [place] sent at 10/05/2022  3:54 PM EDT ----- Regarding: FW: Pts blood sugar was 50. He's not on diabetic meds I don't think. Maybe he just had not eaten in long time ----- Message ----- From: Interface, [facility] Lab Results In Sent: 10/04/2022   3:45 PM EDT To: Randall Hiss, MD

## 2022-10-06 NOTE — Telephone Encounter (Signed)
Patient informed of lab results. Patient states he had eaten before labs, so he is unsure why it was low. I advised him he could discuss this with his pcp as well. Jenet Durio T Pricilla Loveless

## 2022-10-17 ENCOUNTER — Other Ambulatory Visit (HOSPITAL_COMMUNITY): Payer: Self-pay

## 2022-10-17 ENCOUNTER — Telehealth: Payer: Self-pay

## 2022-10-17 DIAGNOSIS — B20 Human immunodeficiency virus [HIV] disease: Secondary | ICD-10-CM

## 2022-10-17 MED ORDER — BIKTARVY 50-200-25 MG PO TABS
1.0000 | ORAL_TABLET | Freq: Every day | ORAL | 5 refills | Status: DC
Start: 2022-10-17 — End: 2022-10-17

## 2022-10-17 MED ORDER — BIKTARVY 50-200-25 MG PO TABS: 1.0000 | ORAL_TABLET | Freq: Every day | ORAL | 5 refills | Status: DC

## 2022-10-17 NOTE — Addendum Note (Signed)
Addended by: Linna Hoff D on: 10/17/2022 02:34 PM   Modules accepted: Orders

## 2022-10-17 NOTE — Telephone Encounter (Signed)
Issues with faxing prescription, Rx re-submitted electronically and successfully transmitted to pharmacy.   Sandie Ano, RN

## 2022-10-17 NOTE — Telephone Encounter (Signed)
Villa would like to use the [facility] pharmacy. Provided him with their phone number to set up delivery.   Canceled prescription with [facility], [facility]. Asked Jovanny to please call back if he continues to have problems getting his medication.   Sandie Ano, RN

## 2022-10-17 NOTE — Telephone Encounter (Signed)
Received voicemail from patient stating he has no refills of Biktarvy. Called [facility], they have 11 refills on file, but state it is too soon for him to pick up as [name] was last dispensed on 8/13. He can get next refill 9/5.  Alexis Welch, he says the last time he got a shipment of medication was 08/28/22. He lives too far away to come get samples, but is wondering if he can use a different pharmacy.   Sandie Ano, RN

## 2022-10-17 NOTE — Telephone Encounter (Signed)
Rx not transmitted electronically. Biktarvy prescription faxed to [facility] [place].   P: [phone] F: [phone]  Sandie Ano, RN

## 2022-10-17 NOTE — Addendum Note (Signed)
Addended by: Linna Hoff D on: 10/17/2022 04:25 PM   Modules accepted: Orders

## 2022-10-18 NOTE — Telephone Encounter (Signed)
Spoke with [facility] and provided them with patient's Medicare and SPAP information. They will reach out to [name] today to finish the enrollment process and set up delivery.   Marqual [place] to request expedited delivery.   Sandie Ano, RN

## 2022-12-07 ENCOUNTER — Encounter (HOSPITAL_COMMUNITY): Payer: Self-pay | Admitting: Psychiatry

## 2022-12-07 ENCOUNTER — Telehealth (HOSPITAL_COMMUNITY): Payer: Medicare Other | Admitting: Psychiatry

## 2022-12-07 VITALS — Wt 146.0 lb

## 2022-12-07 DIAGNOSIS — F411 Generalized anxiety disorder: Secondary | ICD-10-CM | POA: Diagnosis not present

## 2022-12-07 DIAGNOSIS — F319 Bipolar disorder, unspecified: Secondary | ICD-10-CM

## 2022-12-07 DIAGNOSIS — F4310 Post-traumatic stress disorder, unspecified: Secondary | ICD-10-CM

## 2022-12-07 MED ORDER — LAMOTRIGINE 150 MG PO TABS: 150.0000 mg | ORAL_TABLET | Freq: Every day | ORAL | 1 refills | Status: DC
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
## 2023-01-23 ENCOUNTER — Ambulatory Visit: Payer: Medicare Other | Admitting: Infectious Disease

## 2023-03-23 ENCOUNTER — Other Ambulatory Visit: Payer: Self-pay | Admitting: Infectious Disease

## 2023-03-23 DIAGNOSIS — Z21 Asymptomatic human immunodeficiency virus [HIV] infection status: Secondary | ICD-10-CM

## 2023-04-11 ENCOUNTER — Ambulatory Visit: Payer: Medicare Other | Admitting: Infectious Disease

## 2023-04-11 ENCOUNTER — Encounter: Payer: Self-pay | Admitting: Infectious Disease

## 2023-04-11 ENCOUNTER — Other Ambulatory Visit (HOSPITAL_COMMUNITY)
Admission: RE | Admit: 2023-04-11 | Discharge: 2023-04-11 | Disposition: A | Discharge status: Home / Self Care | Payer: Medicare Other | Source: Ambulatory Visit | Attending: Infectious Disease | Admitting: Infectious Disease

## 2023-04-11 ENCOUNTER — Other Ambulatory Visit: Payer: Self-pay

## 2023-04-11 VITALS — BP 134/69 | HR 70 | Temp 97.8°F | Ht 68.0 in | Wt 145.0 lb

## 2023-04-11 DIAGNOSIS — R319 Hematuria, unspecified: Secondary | ICD-10-CM

## 2023-04-11 DIAGNOSIS — B20 Human immunodeficiency virus [HIV] disease: Secondary | ICD-10-CM

## 2023-04-11 DIAGNOSIS — Z8719 Personal history of other diseases of the digestive system: Secondary | ICD-10-CM

## 2023-04-11 DIAGNOSIS — F4310 Post-traumatic stress disorder, unspecified: Secondary | ICD-10-CM

## 2023-04-11 DIAGNOSIS — Z21 Asymptomatic human immunodeficiency virus [HIV] infection status: Secondary | ICD-10-CM | POA: Diagnosis present

## 2023-04-11 DIAGNOSIS — E785 Hyperlipidemia, unspecified: Secondary | ICD-10-CM

## 2023-04-11 DIAGNOSIS — E782 Mixed hyperlipidemia: Secondary | ICD-10-CM

## 2023-04-11 DIAGNOSIS — I10 Essential (primary) hypertension: Secondary | ICD-10-CM

## 2023-04-11 DIAGNOSIS — I2510 Atherosclerotic heart disease of native coronary artery without angina pectoris: Secondary | ICD-10-CM

## 2023-04-11 DIAGNOSIS — F419 Anxiety disorder, unspecified: Secondary | ICD-10-CM

## 2023-04-11 MED ORDER — BIKTARVY 50-200-25 MG PO TABS
1.0000 | ORAL_TABLET | Freq: Every day | ORAL | 5 refills | Status: DC
Start: 2023-04-11 — End: 2023-09-10

## 2023-04-11 MED ORDER — ATORVASTATIN CALCIUM 20 MG PO TABS
20.0000 mg | ORAL_TABLET | Freq: Every day | ORAL | 3 refills | Status: AC
Start: 2023-04-11 — End: ?

## 2023-04-11 NOTE — Progress Notes (Signed)
 Subjective:   Chief complaint: follow-up for HIV disease on medications    Patient ID: Alexis Welch, male    DOB: [birth year], 63 y.o.   MRN: [id number]  HPI  Discussed the use of AI scribe software for clinical note transcription with the patient, who gave verbal consent to proceed.    History of Present Illness        History of Present Illness The patient, with a history of HIV and high cholesterol, presents for a routine follow-up. They report having had several medical tests in the past two years, including checks for colon and prostate cancer. They also had a laparoscopic hernia repair, after which they experienced irritation to the bladder, causing hematuria and pain. They believe the irritation was due to the surgery. They also report having 12 kidney stones, which are currently being managed by a urologist.  The patient has a history of PTSD, which they attribute to a traumatic childhood. They report experiencing panic attacks, particularly in situations where they feel their space is being invaded or in tight spaces. They have managed these attacks by removing themselves from the situation and have not sought psychiatric medication since discontinuing Klonopin, which they felt was making their condition worse.He has also taken himself OFF ALL of his psychiatric medicines and is DRAMATICALLY better off of meds and using his own awareness and CBT skills. He is no longer seeing psychiatry.  The patient also discusses their sexual history, stating that they have only had receptive anal intercourse once, 25 years ago, and do not plan to engage in this behavior again. They express reluctance when the doctor suggests getting an anal Pap smear.  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  Family History  Problem Relation Age of Onset   Cancer Father        colon cancer   Alcohol abuse Father    Dementia Father    Heart disease Maternal Uncle    Alcohol abuse Maternal Uncle    Depression Mother    Physical abuse Mother    Bipolar disorder Sister    Schizophrenia Sister    OCD Sister    Alcohol abuse Brother    Drug abuse Brother    Seizures Brother    Alcohol abuse Maternal Aunt    Depression Maternal Aunt    Alcohol abuse Paternal Uncle    Depression Paternal Uncle       Social History   Socioeconomic History   Marital status: Single    Spouse name: Not on file   Number of children: Not on file   Years of education: Not on file   Highest education level: Not on file  Occupational History   Not on file  Tobacco Use   Smoking status: Never   Smokeless tobacco: Never  Vaping Use   Vaping status: Never Used  Substance and Sexual  Activity   Alcohol use: No    Alcohol/week: 0.0 standard drinks of alcohol   Drug use: Yes    Frequency: 3.0 times per week    Types: Marijuana   Sexual activity: Not Currently    Partners: Male    Birth control/protection: Condom    Comment: declined condoms  Other Topics Concern   Not on file  Social History Narrative   Not on file   Social Drivers of Health   Financial Resource Strain: Not on file  Food Insecurity: Not on file  Transportation Needs: Not on file  Physical Activity: Not on file  Stress: Not on file  Social Connections: Not on file    No Known Allergies   Current Outpatient Medications:    amLODipine (NORVASC) 10 MG tablet, Take 1 tablet  (10 mg total) by mouth daily., Disp: 30 tablet, Rfl: 1   atorvastatin (LIPITOR) 20 MG tablet, Take 1 tablet (20 mg total) by mouth daily., Disp: 90 tablet, Rfl: 1   bictegravir-emtricitabine-tenofovir AF (BIKTARVY) 50-200-25 MG TABS tablet, Take 1 tablet by mouth daily., Disp: 30 tablet, Rfl: 5   buPROPion (WELLBUTRIN XL) 300 MG 24 hr tablet, TAKE 1 TABLET BY MOUTH DAILY (Patient not taking: Reported on 04/11/2023), Disp: 90 tablet, Rfl: 0   clonazePAM (KLONOPIN) 1 MG tablet, Take 1 tablet (1 mg total) by mouth at bedtime as needed for anxiety. (Patient not taking: Reported on 04/11/2023), Disp: 30 tablet, Rfl: 0   lamoTRIgine (LAMICTAL) 150 MG tablet, Take 1 tablet (150 mg total) by mouth daily., Disp: 90 tablet, Rfl: 1   loratadine (CLARITIN) 10 MG tablet, Take 10 mg by mouth as needed for allergies. (Patient not taking: Reported on 10/04/2022), Disp: , Rfl:    metoprolol tartrate (LOPRESSOR) 25 MG tablet, Take 12.5 mg by mouth 2 (two) times daily., Disp: , Rfl:    temazepam (RESTORIL) 15 MG capsule, Take 15 mg by mouth at bedtime., Disp: , Rfl:    triamcinolone ointment (KENALOG) 0.5 %, Apply topically. (Patient not taking: Reported on 04/11/2023), Disp: , Rfl:      Review of Systems     Objective:   Physical Exam        Assessment & Plan:    Assessment and Plan HIV: Well controlled on Biktarvy with undetectable viral load. -Continue Biktarvy as prescribed. --check HIV RNA, CD4 routine labs  Hyperlipidemia: Recent LDL of 167 despite being on Atorvastatin. -Order direct LDL and lipid panel to assess current status. -Consider increasing Atorvastatin dose or switching to a different statin if LDL remains elevated.  Hemorrhoids: No current symptoms reported. -No changes to current management.  Anal HPV Screening: Patient has a history of receptive intercourse but has been celibate for 25 years. Discussed the benefits of screening for HPV due to its association with rectal  cancer. -Patient to consider anal Pap smear at next visit.  General Health Maintenance: -Continue regular screenings for colon and prostate health. -Consider further investigation of kidney stones with urologist.   PTSD and history of Panic Attacks: he is managing

## 2023-04-12 LAB — URINE CYTOLOGY ANCILLARY ONLY
Chlamydia: NEGATIVE
Comment: NEGATIVE
Comment: NORMAL
Neisseria Gonorrhea: NEGATIVE

## 2023-04-13 LAB — COMPLETE METABOLIC PANEL WITH GFR
AG Ratio: 2.1 (calc) (ref 1.0–2.5)
ALT: 25 U/L (ref 9–46)
AST: 20 U/L (ref 10–35)
Albumin: 4.8 g/dL (ref 3.6–5.1)
Alkaline phosphatase (APISO): 110 U/L (ref 35–144)
BUN: 17 mg/dL (ref 7–25)
CO2: 29 mmol/L (ref 20–32)
Calcium: 9.7 mg/dL (ref 8.6–10.3)
Chloride: 106 mmol/L (ref 98–110)
Creat: 1.09 mg/dL (ref 0.70–1.35)
Globulin: 2.3 g/dL (ref 1.9–3.7)
Glucose, Bld: 68 mg/dL (ref 65–99)
Potassium: 4.1 mmol/L (ref 3.5–5.3)
Sodium: 141 mmol/L (ref 135–146)
Total Bilirubin: 1 mg/dL (ref 0.2–1.2)
Total Protein: 7.1 g/dL (ref 6.1–8.1)
eGFR: 76 mL/min/{1.73_m2} (ref >=60)

## 2023-04-13 LAB — CBC WITH DIFFERENTIAL/PLATELET
Absolute Lymphocytes: 2244 {cells}/uL (ref 850–3900)
Absolute Monocytes: 589 {cells}/uL (ref 200–950)
Basophils Absolute: 28 {cells}/uL (ref 0–200)
Basophils Relative: 0.4 %
Eosinophils Absolute: 99 {cells}/uL (ref 15–500)
Eosinophils Relative: 1.4 %
HCT: 45.6 % (ref 38.5–50.0)
Hemoglobin: 15.4 g/dL (ref 13.2–17.1)
MCH: 31.4 pg (ref 27.0–33.0)
MCHC: 33.8 g/dL (ref 32.0–36.0)
MCV: 92.9 fL (ref 80.0–100.0)
MPV: 10.5 fL (ref 7.5–12.5)
Monocytes Relative: 8.3 %
Neutro Abs: 4139 {cells}/uL (ref 1500–7800)
Neutrophils Relative %: 58.3 %
Platelets: 211 10*3/uL (ref 140–400)
RBC: 4.91 10*6/uL (ref 4.20–5.80)
RDW: 13 % (ref 11.0–15.0)
Total Lymphocyte: 31.6 %
WBC: 7.1 10*3/uL (ref 3.8–10.8)

## 2023-04-13 LAB — HIV-1 RNA QUANT-NO REFLEX-BLD
HIV 1 RNA Quant: NOT DETECTED {copies}/mL
HIV-1 RNA Quant, Log: NOT DETECTED {Log}

## 2023-04-13 LAB — LIPID PANEL
Cholesterol: 140 mg/dL (ref <200)
HDL: 75 mg/dL (ref >=40)
LDL Cholesterol (Calc): 51 mg/dL
Non-HDL Cholesterol (Calc): 65 mg/dL (ref <130)
Total CHOL/HDL Ratio: 1.9 (calc) (ref <5.0)
Triglycerides: 63 mg/dL (ref <150)

## 2023-04-13 LAB — LDL CHOLESTEROL, DIRECT: Direct LDL: 58 mg/dL (ref <100)

## 2023-04-13 LAB — T-HELPER CELLS (CD4) COUNT (NOT AT ARMC)
CD4 % Helper T Cell: 37 % (ref 33–65)
CD4 T Cell Abs: 833 /uL (ref 400–1790)

## 2023-04-13 LAB — RPR: RPR Ser Ql: NONREACTIVE

## 2023-06-07 ENCOUNTER — Telehealth (HOSPITAL_COMMUNITY): Payer: Medicare Other | Admitting: Psychiatry

## 2023-06-08 ENCOUNTER — Encounter (HOSPITAL_COMMUNITY): Payer: Self-pay | Admitting: Psychiatry

## 2023-06-08 ENCOUNTER — Telehealth (HOSPITAL_COMMUNITY): Admitting: Psychiatry

## 2023-06-08 VITALS — Wt 145.0 lb

## 2023-06-08 DIAGNOSIS — F411 Generalized anxiety disorder: Secondary | ICD-10-CM

## 2023-06-08 DIAGNOSIS — F4310 Post-traumatic stress disorder, unspecified: Secondary | ICD-10-CM | POA: Diagnosis not present

## 2023-06-08 MED ORDER — LAMOTRIGINE 150 MG PO TABS: 150.0000 mg | ORAL_TABLET | Freq: Every day | ORAL | 1 refills | Status: DC
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
## 2023-06-25 ENCOUNTER — Ambulatory Visit: Payer: Medicare Other | Admitting: Infectious Disease

## 2023-07-13 NOTE — Progress Notes (Signed)
 The 10-year ASCVD risk score (Arnett DK, et al., 2019) is: 9%   Values used to calculate the score:     Age: 63 years     Sex: Male     Is Non-Hispanic African American: No     Diabetic: No     Tobacco smoker: No     Systolic Blood Pressure: 134 mmHg     Is BP treated: Yes     HDL Cholesterol: 75 mg/dL     Total Cholesterol: 140 mg/dL  Currently prescribed atorvastatin  20 mg.  Harley Mccartney, BSN, RN

## 2023-09-10 ENCOUNTER — Encounter: Payer: Self-pay | Admitting: Infectious Disease

## 2023-09-10 DIAGNOSIS — Z7185 Encounter for immunization safety counseling: Secondary | ICD-10-CM | POA: Insufficient documentation

## 2023-09-10 NOTE — Progress Notes (Unsigned)
 Subjective:  Chief complaint: follow-up for HIV disease on medications also w c/o fatigue   Patient ID: Alexis Welch, male    DOB: 1960/09/15, 63 y.o.   MRN: [id number]  HPI  Discussed the use of AI scribe software for clinical note transcription with the patient, who gave verbal consent to proceed.  History of Present Illness   Alexis Welch is a 63 year old male with HIV who presents with persistent fatigue and insomnia.  He experiences persistent fatigue since April, with an inability to stay awake for more than three hours. Symptoms worsen with sun exposure, causing ankle weakness and periorbital itchiness. 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    Insomnia has been an issue, previously managed with temazepam, which led to vivid dreams and nocturnal eating. Discontinuation of temazepam has resulted in some symptom improvement. Dietary changes, including reduced sodium intake, have also been beneficial.  He is on Biktarvy  for HIV management. He has not engaged in receptive anal intercourse for over twenty years. He takes lamotrigine  for PTSD and kidney issues. No other antidepressants are in use.   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  Family History  Problem Relation Age of Onset   Cancer Father        colon cancer   Alcohol abuse  Father    Dementia Father    Heart disease Maternal Uncle    Alcohol abuse Maternal Uncle    Depression Mother    Physical abuse Mother    Bipolar disorder Sister    Schizophrenia Sister    OCD Sister    Alcohol abuse Brother    Drug abuse Brother    Seizures Brother    Alcohol abuse Maternal Aunt    Depression Maternal Aunt    Alcohol abuse Paternal Uncle    Depression Paternal Uncle       Social History   Socioeconomic History   Marital status: Single    Spouse name: Not on file   Number of children: Not on file   Years of education: Not on file   Highest education level: Not on file  Occupational History   Not on file  Tobacco Use   Smoking status: Never   Smokeless tobacco: Never  Vaping Use   Vaping status: Never Used  Substance and Sexual Activity   Alcohol use: No    Alcohol/week: 0.0 standard drinks of alcohol   Drug use: Yes    Frequency: 3.0 times per week    Types: Marijuana   Sexual activity: Not Currently    Partners: Male    Birth control/protection: Condom    Comment: declined condoms  Other Topics Concern   Not on file  Social History Narrative   Not on file  Social Drivers of Corporate investment banker Strain: Not on file  Food Insecurity: Not on file  Transportation Needs: Not on file  Physical Activity: Not on file  Stress: Not on file  Social Connections: Not on file    No Known Allergies   Current Outpatient Medications:    amLODipine  (NORVASC ) 10 MG tablet, Take 1 tablet (10 mg total) by mouth daily., Disp: 30 tablet, Rfl: 1   atorvastatin  (LIPITOR) 20 MG tablet, Take 1 tablet (20 mg total) by mouth daily., Disp: 90 tablet, Rfl: 3   bictegravir-emtricitabine -tenofovir  AF (BIKTARVY ) 50-200-25 MG TABS tablet, Take 1 tablet by mouth daily., Disp: 30 tablet, Rfl: 5   lamoTRIgine  (LAMICTAL ) 150 MG tablet, Take 1 tablet (150 mg total) by mouth daily., Disp: 90 tablet, Rfl: 1   metoprolol  tartrate (LOPRESSOR ) 25 MG tablet, Take 12.5  mg by mouth 2 (two) times daily., Disp: , Rfl:    temazepam (RESTORIL) 15 MG capsule, Take 15 mg by mouth at bedtime., Disp: , Rfl:    Review of Systems  Constitutional:  Negative for activity change, appetite change, chills, diaphoresis, fatigue, fever and unexpected weight change.  HENT:  Negative for congestion, rhinorrhea, sinus pressure, sneezing, sore throat and trouble swallowing.   Eyes:  Negative for photophobia and visual disturbance.  Respiratory:  Negative for cough, chest tightness, shortness of breath, wheezing and stridor.   Cardiovascular:  Negative for chest pain, palpitations and leg swelling.  Gastrointestinal:  Negative for abdominal distention, abdominal pain, anal bleeding, blood in stool, constipation, diarrhea, nausea and vomiting.  Genitourinary:  Negative for difficulty urinating, dysuria, flank pain and hematuria.  Musculoskeletal:  Negative for arthralgias, back pain, gait problem, joint swelling and myalgias.  Skin:  Negative for color change, pallor, rash and wound.  Neurological:  Negative for dizziness, tremors, weakness and light-headedness.  Hematological:  Negative for adenopathy. Does not bruise/bleed easily.  Psychiatric/Behavioral:  Negative for agitation, behavioral problems, confusion, decreased concentration, dysphoric mood and sleep disturbance.        Objective:   Physical Exam Constitutional:      Appearance: He is well-developed.  HENT:     Head: Normocephalic and atraumatic.  Eyes:     Conjunctiva/sclera: Conjunctivae normal.  Cardiovascular:     Rate and Rhythm: Normal rate and regular rhythm.  Pulmonary:     Effort: Pulmonary effort is normal. No respiratory distress.     Breath sounds: No wheezing.  Abdominal:     General: There is no distension.     Palpations: Abdomen is soft.  Musculoskeletal:        General: No tenderness. Normal range of motion.     Cervical back: Normal range of motion and neck supple.  Skin:    General:  Skin is warm and dry.     Coloration: Skin is not pale.     Findings: No erythema or rash.  Neurological:     General: No focal deficit present.     Mental Status: He is alert and oriented to person, place, and time.  Psychiatric:        Mood and Affect: Mood normal.        Behavior: Behavior normal.        Thought Content: Thought content normal.        Judgment: Judgment normal.           Assessment & Plan:   Assessment and Plan    HIV infection HIV is well-controlled with Biktarvy . Viral load is  suppressed. - Order routine labs for HIV including viral load and CD4 count.   Rectal cancer screening Discussed preventative care and screening importance, especially for anal cancer risk in HIV. Explained anal Pap smear as a preventative measure and its role in early detection. - Perform anal Pap smear. - If abnormal, refer for high-resolution anoscopy.  STI screening Discussed STI screening importance due to history. Agreed on gonorrhea and chlamydia swab of the mouth. - Conduct STI testing including gonorrhea and chlamydia swab of the mouth and in urine  Fatigue and excessive daytime sleepiness associated with insomnia and adverse effect of temazepam Fatigue and sleepiness likely due to insomnia+/- temazepam side effects. Symptoms include inability to stay awake, vivid dreams, and sleep disruption. Trazodone  considered as an alternative. - Prescribe trazodone  50 mg at bedtime. and followup with PCP   Hyperlipidemia: cotninue atorvastatin    HTN: continue metoprolol  (some pts experience fatigue with this med though he has been on it chronically)   Vaccine counseling: recommended Prevnar 20,     DSAP continue to follow up with Dermatology

## 2023-09-11 ENCOUNTER — Encounter: Payer: Self-pay | Admitting: Infectious Disease

## 2023-09-11 ENCOUNTER — Other Ambulatory Visit (HOSPITAL_COMMUNITY)
Admission: RE | Admit: 2023-09-11 | Discharge: 2023-09-11 | Disposition: A | Discharge status: Home / Self Care | Source: Ambulatory Visit | Attending: Infectious Disease | Admitting: Infectious Disease

## 2023-09-11 ENCOUNTER — Ambulatory Visit

## 2023-09-11 ENCOUNTER — Ambulatory Visit (INDEPENDENT_AMBULATORY_CARE_PROVIDER_SITE_OTHER): Admitting: Infectious Disease

## 2023-09-11 ENCOUNTER — Other Ambulatory Visit: Payer: Self-pay

## 2023-09-11 VITALS — BP 168/81 | HR 74 | Temp 98.0°F | Ht 68.0 in | Wt 148.0 lb

## 2023-09-11 DIAGNOSIS — Z21 Asymptomatic human immunodeficiency virus [HIV] infection status: Secondary | ICD-10-CM

## 2023-09-11 DIAGNOSIS — R5383 Other fatigue: Secondary | ICD-10-CM

## 2023-09-11 DIAGNOSIS — Z7185 Encounter for immunization safety counseling: Secondary | ICD-10-CM | POA: Insufficient documentation

## 2023-09-11 DIAGNOSIS — L565 Disseminated superficial actinic porokeratosis (DSAP): Secondary | ICD-10-CM | POA: Insufficient documentation

## 2023-09-11 DIAGNOSIS — B20 Human immunodeficiency virus [HIV] disease: Secondary | ICD-10-CM | POA: Insufficient documentation

## 2023-09-11 DIAGNOSIS — Z1212 Encounter for screening for malignant neoplasm of rectum: Secondary | ICD-10-CM

## 2023-09-11 DIAGNOSIS — I10 Essential (primary) hypertension: Secondary | ICD-10-CM

## 2023-09-11 DIAGNOSIS — E782 Mixed hyperlipidemia: Secondary | ICD-10-CM | POA: Diagnosis present

## 2023-09-11 DIAGNOSIS — I2510 Atherosclerotic heart disease of native coronary artery without angina pectoris: Secondary | ICD-10-CM

## 2023-09-11 DIAGNOSIS — Z23 Encounter for immunization: Secondary | ICD-10-CM | POA: Diagnosis not present

## 2023-09-11 DIAGNOSIS — F4310 Post-traumatic stress disorder, unspecified: Secondary | ICD-10-CM

## 2023-09-11 DIAGNOSIS — F319 Bipolar disorder, unspecified: Secondary | ICD-10-CM

## 2023-09-11 DIAGNOSIS — E785 Hyperlipidemia, unspecified: Secondary | ICD-10-CM | POA: Diagnosis not present

## 2023-09-11 DIAGNOSIS — F331 Major depressive disorder, recurrent, moderate: Secondary | ICD-10-CM

## 2023-09-11 MED ORDER — TRAZODONE HCL 50 MG PO TABS: 50.0000 mg | ORAL_TABLET | Freq: Every day | ORAL | 11 refills | Status: AC

## 2023-09-11 MED ORDER — BIKTARVY 50-200-25 MG PO TABS: 1.0000 | ORAL_TABLET | Freq: Every day | ORAL | 5 refills | Status: DC

## 2023-09-11 MED ORDER — BIKTARVY 50-200-25 MG PO TABS: 1.0000 | ORAL_TABLET | Freq: Every day | ORAL | 11 refills | Status: DC

## 2023-09-11 NOTE — Addendum Note (Signed)
 Addended by: CELESTIA LELA HERO on: 09/11/2023 02:58 PM   Modules accepted: Orders

## 2023-09-11 NOTE — Addendum Note (Signed)
 Addended by: FLEETA KATHIE FLEET N on: 09/11/2023 12:25 PM   Modules accepted: Level of Service

## 2023-09-12 LAB — CYTOLOGY, (ORAL, ANAL, URETHRAL) ANCILLARY ONLY
Chlamydia: NEGATIVE
Comment: NEGATIVE
Comment: NORMAL
Neisseria Gonorrhea: NEGATIVE

## 2023-09-12 LAB — URINE CYTOLOGY ANCILLARY ONLY
Chlamydia: NEGATIVE
Comment: NEGATIVE
Comment: NORMAL
Neisseria Gonorrhea: NEGATIVE

## 2023-09-12 LAB — T-HELPER CELLS (CD4) COUNT (NOT AT ARMC)
CD4 % Helper T Cell: 36 % (ref 33–65)
CD4 T Cell Abs: 691 /uL (ref 400–1790)

## 2023-09-13 LAB — COMPLETE METABOLIC PANEL WITHOUT GFR
AG Ratio: 2.3 (calc) (ref 1.0–2.5)
ALT: 22 U/L (ref 9–46)
AST: 22 U/L (ref 10–35)
Albumin: 4.8 g/dL (ref 3.6–5.1)
Alkaline phosphatase (APISO): 95 U/L (ref 35–144)
BUN: 15 mg/dL (ref 7–25)
CO2: 27 mmol/L (ref 20–32)
Calcium: 9.6 mg/dL (ref 8.6–10.3)
Chloride: 106 mmol/L (ref 98–110)
Creat: 1.03 mg/dL (ref 0.70–1.35)
Globulin: 2.1 g/dL (ref 1.9–3.7)
Glucose, Bld: 72 mg/dL (ref 65–99)
Potassium: 4 mmol/L (ref 3.5–5.3)
Sodium: 143 mmol/L (ref 135–146)
Total Bilirubin: 1.2 mg/dL (ref 0.2–1.2)
Total Protein: 6.9 g/dL (ref 6.1–8.1)

## 2023-09-13 LAB — CBC WITH DIFFERENTIAL/PLATELET
Absolute Lymphocytes: 1879 {cells}/uL (ref 850–3900)
Absolute Monocytes: 470 {cells}/uL (ref 200–950)
Basophils Absolute: 32 {cells}/uL (ref 0–200)
Basophils Relative: 0.6 %
Eosinophils Absolute: 81 {cells}/uL (ref 15–500)
Eosinophils Relative: 1.5 %
HCT: 44.3 % (ref 38.5–50.0)
Hemoglobin: 14.8 g/dL (ref 13.2–17.1)
MCH: 31.4 pg (ref 27.0–33.0)
MCHC: 33.4 g/dL (ref 32.0–36.0)
MCV: 93.9 fL (ref 80.0–100.0)
MPV: 11 fL (ref 7.5–12.5)
Monocytes Relative: 8.7 %
Neutro Abs: 2938 {cells}/uL (ref 1500–7800)
Neutrophils Relative %: 54.4 %
Platelets: 190 Thousand/uL (ref 140–400)
RBC: 4.72 Million/uL (ref 4.20–5.80)
RDW: 12.8 % (ref 11.0–15.0)
Total Lymphocyte: 34.8 %
WBC: 5.4 Thousand/uL (ref 3.8–10.8)

## 2023-09-13 LAB — LIPID PANEL
Cholesterol: 125 mg/dL (ref <200)
HDL: 59 mg/dL (ref >=40)
LDL Cholesterol (Calc): 52 mg/dL
Non-HDL Cholesterol (Calc): 66 mg/dL (ref <130)
Total CHOL/HDL Ratio: 2.1 (calc) (ref <5.0)
Triglycerides: 63 mg/dL (ref <150)

## 2023-09-13 LAB — HIV-1 RNA QUANT-NO REFLEX-BLD
HIV 1 RNA Quant: <20 {copies}/mL — AB
HIV-1 RNA Quant, Log: <1.3 {Log_copies}/mL — AB

## 2023-09-13 LAB — RPR: RPR Ser Ql: NONREACTIVE

## 2023-09-17 ENCOUNTER — Other Ambulatory Visit: Payer: Self-pay | Admitting: Infectious Disease

## 2023-09-17 DIAGNOSIS — Z21 Asymptomatic human immunodeficiency virus [HIV] infection status: Secondary | ICD-10-CM

## 2023-09-18 LAB — CYTOLOGY - PAP
Adequacy: ABSENT
Diagnosis: NEGATIVE

## 2023-09-18 NOTE — Telephone Encounter (Signed)
 11 refills sent 09/11/23

## 2023-10-21 LAB — COLOGUARD: COLOGUARD: NEGATIVE

## 2023-12-07 ENCOUNTER — Telehealth (HOSPITAL_COMMUNITY): Admitting: Psychiatry

## 2023-12-07 ENCOUNTER — Encounter (HOSPITAL_COMMUNITY): Payer: Self-pay | Admitting: Psychiatry

## 2023-12-07 VITALS — Wt 145.0 lb

## 2023-12-07 DIAGNOSIS — F411 Generalized anxiety disorder: Secondary | ICD-10-CM | POA: Diagnosis not present

## 2023-12-07 DIAGNOSIS — F4310 Post-traumatic stress disorder, unspecified: Secondary | ICD-10-CM

## 2023-12-07 MED ORDER — LAMOTRIGINE 150 MG PO TABS: 150.0000 mg | ORAL_TABLET | Freq: Every day | ORAL | 1 refills | Status: AC

## 2023-12-07 NOTE — Progress Notes (Signed)
 [facility] MD Virtual Progress Note   Patient Location: Home Provider Location: Home Office  I connect with patient by video and verified that I am speaking with correct person by using two identifiers. I discussed the limitations of evaluation and management by telemedicine and the availability of in person appointments. I also discussed with the patient that there may be a patient responsible charge related to this service. The patient expressed understanding and agreed to proceed.  Alexis Welch [id number] 63 y.o.  12/07/2023 10:23 AM  History of Present Illness:  Patient is evaluated by video session.  He reported things are much better since he started to focus on his own health and issues.  He moved and living in a small apartment.  He still have triggers which remind about his past when he was growing up but he is learning how to control the symptoms.  He reported sleep medicine is change now because temazepam was making him very tired groggy and have no energy.  His provider now giving him trazodone  and he is taking every night which is helping him sleep.  He denies any major panic attack but does feel very anxious nervous sometimes but using coping skills has been very helpful.  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  He tried to keep himself busy.  He reported significant weight loss after he had a hernia surgery but things are back and he is able to gain his weight back.  He denies any suicidal thoughts or homicidal thoughts.  He feels Lamictal  has been very helpful to help his symptoms.  He has no rash or any itching.  He is even thinking to consider relationship.  His appetite is okay.  Since he moved to a new place he is much happier.  He like to keep his current medication.  Past Psychiatric History: H/O anxiety and bipolar disorder.  No h/o inpatient or any  suicidal attempt.  Tried Celexa , Trileptal, Lamictal , Lexapro , Zyprexa  and Depakote .  Depakote  and Lexapro  caused diarrhea.     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   atorvastatin  (LIPITOR) 20 MG tablet Take 1 tablet (20 mg total) by mouth daily.   bictegravir-emtricitabine -tenofovir  AF (BIKTARVY ) 50-200-25 MG TABS tablet Take 1 tablet by mouth daily.   lamoTRIgine  (LAMICTAL ) 150 MG tablet Take 1 tablet (150 mg total) by mouth daily.   metoprolol  tartrate (LOPRESSOR ) 25 MG tablet Take 12.5 mg by mouth 2 (two) times daily.   temazepam (RESTORIL) 15 MG capsule Take 15 mg by mouth at bedtime.   traZODone  (DESYREL ) 50 MG tablet Take 1 tablet (50 mg total) by mouth at bedtime.   No facility-administered encounter medications on file as  of 12/07/2023.    Recent Results (from the past 2160 hours)  T-helper cells (CD4) count (not at [facility])     Status: None   Collection Time: 09/11/23 10:41 AM  Result Value Ref Range   CD4 T Cell Abs 691 400 - 1,790 /uL   CD4 % Helper T Cell 36 33 - 65 %    Comment: Performed at [facility], [address], [place], [place] [address]  Urine cytology ancillary only     Status: None   Collection Time: 09/11/23 10:41 AM  Result Value Ref Range   Neisseria Gonorrhea Negative    Chlamydia Negative     Comment Normal Reference Ranger Chlamydia - Negative    Comment      Normal Reference Range Neisseria Gonorrhea - Negative  Cytology (oral, anal, urethral) ancillary only     Status: None   Collection Time: 09/11/23 10:41 AM  Result Value Ref Range   Neisseria Gonorrhea Negative    Chlamydia Negative    Comment Normal Reference Ranger Chlamydia - Negative    Comment      Normal Reference Range Neisseria Gonorrhea - Negative  Cytology - PAP     Status: None   Collection Time: 09/11/23 10:41 AM  Result Value Ref Range   Adequacy      Satisfactory for evaluation; transformation zone component ABSENT.   Diagnosis      - Negative for intraepithelial lesion or malignancy (NILM)  HIV-1 RNA quant-no reflex-bld     Status: Abnormal   Collection Time: 09/11/23 10:56 AM  Result Value Ref Range   HIV 1 RNA Quant <20 DETECTED (A) NOT DETECTED copies/mL   HIV-1 RNA Quant, Log <1.30 DETECTED (A) NOT DETECTED Log copies/mL    Comment: . HIV-1 RNA was detected below 20 copies/mL. Viral nucleic acid detected below this level cannot be quantified by the assay.  . . This test was performed using Real-Time Polymerase Chain Reaction. . Reportable Range: 20 copies/mL to 10,000,000 copies/mL (1.30 log copies/mL to 7.00 log copies/mL).   COMPLETE METABOLIC PANEL WITHOUT GFR     Status: None   Collection Time: 09/11/23 10:56 AM  Result Value Ref Range   Glucose, Bld 72 65 - 99 mg/dL    Comment: .            Fasting reference interval .    BUN 15 7 - 25 mg/dL   Creat 8.96 9.29 - 8.64 mg/dL   BUN/Creatinine Ratio SEE NOTE: 6 - 22 (calc)    Comment:    Not Reported: BUN and Creatinine are within    reference range. .    Sodium 143 135 - 146 mmol/L   Potassium 4.0 3.5 - 5.3 mmol/L   Chloride 106 98 - 110 mmol/L   CO2 27 20 - 32 mmol/L   Calcium  9.6 8.6 - 10.3 mg/dL   Total Protein 6.9 6.1 - 8.1 g/dL   Albumin 4.8 3.6 - 5.1 g/dL   Globulin 2.1 1.9 - 3.7 g/dL (calc)   AG Ratio 2.3 1.0 - 2.5  (calc)   Total Bilirubin 1.2 0.2 - 1.2 mg/dL   Alkaline phosphatase (APISO) 95 35 - 144 U/L   AST 22 10 - 35 U/L   ALT 22 9 - 46 U/L  CBC with Differential/Platelet     Status: None   Collection Time: 09/11/23 10:56 AM  Result Value Ref Range   WBC 5.4 3.8 - 10.8 Thousand/uL   RBC 4.72 4.20 -  5.80 Million/uL   Hemoglobin 14.8 13.2 - 17.1 g/dL   HCT 55.6 61.4 - 49.9 %   MCV 93.9 80.0 - 100.0 fL   MCH 31.4 27.0 - 33.0 pg   MCHC 33.4 32.0 - 36.0 g/dL    Comment: For adults, a slight decrease in the calculated MCHC value (in the range of 30 to 32 g/dL) is most likely not clinically significant; however, it should be interpreted with caution in correlation with other red cell parameters and the patient's clinical condition.    RDW 12.8 11.0 - 15.0 %   Platelets 190 140 - 400 Thousand/uL   MPV 11.0 7.5 - 12.5 fL   Neutro Abs 2,938 1,500 - 7,800 cells/uL   Absolute Lymphocytes 1,879 850 - 3,900 cells/uL   Absolute Monocytes 470 200 - 950 cells/uL   Eosinophils Absolute 81 15 - 500 cells/uL   Basophils Absolute 32 0 - 200 cells/uL   Neutrophils Relative % 54.4 %   Total Lymphocyte 34.8 %   Monocytes Relative 8.7 %   Eosinophils Relative 1.5 %   Basophils Relative 0.6 %  RPR     Status: None   Collection Time: 09/11/23 10:56 AM  Result Value Ref Range   RPR Ser Ql NON-REACTIVE NON-REACTIVE    Comment: . No laboratory evidence of syphilis. If recent exposure is suspected, submit a new sample in 2-4 weeks. .   Lipid panel     Status: None   Collection Time: 09/11/23 10:56 AM  Result Value Ref Range   Cholesterol 125 <200 mg/dL   HDL 59 > OR = 40 mg/dL   Triglycerides 63 <849 mg/dL   LDL Cholesterol (Calc) 52 mg/dL (calc)    Comment: Reference range: <100 . Desirable range <100 mg/dL for primary prevention;   <70 mg/dL for patients with CHD or diabetic patients  with > or = 2 CHD risk factors. SABRA LDL-C is now calculated using the Martin-Hopkins  calculation, which is a  validated novel method providing  better accuracy than the Friedewald equation in the  estimation of LDL-C.  Gladis APPLETHWAITE et al. SANDREA. 7986;689(80): 2061-2068  (http://education.Quest[facility].com/faq/FAQ164)    Total CHOL/HDL Ratio 2.1 <5.0 (calc)   Non-HDL Cholesterol (Calc) 66 <869 mg/dL (calc)    Comment: For patients with diabetes plus 1 major ASCVD risk  factor, treating to a non-HDL-C goal of <100 mg/dL  (LDL-C of <29 mg/dL) is considered a therapeutic  option.   COLOGUARD     Status: None   Collection Time: 10/14/23 10:00 PM  Result Value Ref Range   COLOGUARD Negative Negative    Comment: The Cologuard (TM) test was performed on this specimen.  NEGATIVE TEST RESULT. A negative Cologuard result indicates a low likelihood that a colorectal cancer (CRC) or advanced adenoma (adenomatous polyps with more advanced pre-malignant features) is present. The chance that a person with a negative Cologuard test has a colorectal cancer is less than 1 in 1500 (negative predictive value >99.9%) or has an advanced adenoma is less than 5.3% (negative predictive value 94.7%). These data are based on a prospective cross-sectional study of 10,000 individuals at average risk for colorectal cancer who were screened with both Cologuard and colonoscopy. (Imperiale T. et al, N Engl J Med 2014;370(14):1286-1297) The normal value (reference range) for this assay is negative.  COLOGUARD RE-SCREENING RECOMMENDATION: Periodic colorectal cancer screening is an important part of preventive healthcare for asymptomatic individuals at average risk for colorectal cancer. Following a negative Cologuard result, the American  Cancer Society and U.S. Multi-Society Task Force screening guidelines recommend a Cologuard re-screening interval of 3 years.  References: American Cancer Society Guideline for Colorectal Cancer Screening:  https://www.cancer.org/cancer/colon-rectal-cancer/detection-diagnosis-staging/acs-recommendations.html.; Rex DK, Boland CR, Dominitz JK, Colorectal Cancer Screening: Recommendations for Physicians and Patients from the U.S. Multi-Society Task Force on Colorectal Cancer Screening , Am J Gastroenterology 2017; 112:1016-1030.  TEST DESCRIPTION: Composite algorithmic analysis of stool DNA-biomarkers with hemoglobin immunoassay.   Quantitative values of individual biomarkers are not reportable and are not associated with individual biomarker result reference ranges. Cologuard is intended for colorectal cancer screening of adults of either sex, 45 years or older, who are at average-risk for colorectal cancer (CRC). Cologuard has been approved for use by the U.S. FDA. The performance of Cologuard was established in a cross sectional study of average-risk adults aged 35-84. Cologuard performance in patients ages 69 to 37 years was estimated by sub-group analysis of near-age groups. Colonoscopies performed for a positive result may find as the most clinically significant lesion: colorectal cancer [4.0%], advanced adenoma (including sessile serrated polyps greater than or equal to 1cm diameter) [20%] or non- advanced adenoma [31%]; or no colorectal neoplasia [45%]. These estimates are derived from a prospective  cross-sectional screening study of 10,000 individuals at average risk for colorectal cancer who were screened with both Cologuard and colonoscopy. (Imperiale T. et al, LOISE [place] J Med 2014;370(14):1286-1297.) Cologuard may produce a false negative or false positive result (no colorectal cancer or precancerous polyp present at colonoscopy follow up). A negative Cologuard test result does not guarantee the absence of CRC or advanced adenoma (pre-cancer). The current Cologuard screening interval is every 3 years. Science writer and U.S. Therapist, music). Cologuard performance data in a 10,000 patient  pivotal study using colonoscopy as the reference method can be accessed at the following location: www.exactlabs.com/results. Additional description of the Cologuard test process, warnings and precautions can be found at www.cologuard.com.     Psychiatric Specialty Exam: Physical Exam  Review of Systems  Weight 145 lb (65.8 kg).There is no height or weight on file to calculate BMI.  General Appearance: Casual  Eye Contact:  Good  Speech:  Clear and Coherent  Volume:  Normal  Mood:  Euthymic  Affect:  Appropriate  Thought Process:  Goal Directed  Orientation:  Full (Time, Place, and Person)  Thought Content:  Logical  Suicidal Thoughts:  No  Homicidal Thoughts:  No  Memory:  Immediate;   Good Recent;   Good Remote;   Good  Judgement:  Good  Insight:  Good  Psychomotor Activity:  Normal  Concentration:  Concentration: Good and Attention Span: Good  Recall:  Good  Fund of Knowledge:  Good  Language:  Good  Akathisia:  No  Handed:  Right  AIMS (if indicated):     Assets:  Communication Skills Desire for Improvement Housing Social Support Transportation  ADL's:  Intact  Cognition:  WNL  Sleep:  good with Trazodone         04/11/2023   10:36 AM 10/04/2022    9:50 AM 10/03/2021    2:21 PM 03/07/2021    1:41 PM 04/12/2020    2:41 PM  Depression screen PHQ 2/9  Decreased Interest 0 0 1 0 1  Down, Depressed, Hopeless 0 0 1 0 1  PHQ - 2 Score 0 0 2 0 2  Altered sleeping   1  1  Tired, decreased energy   1  2  Change in appetite   0  1  Feeling bad or failure about yourself    0  1  Trouble concentrating   0  1  Moving slowly or fidgety/restless   0  1  Suicidal thoughts   0  0  PHQ-9 Score   4  9  Difficult doing work/chores   Somewhat difficult  Somewhat difficult    Assessment/Plan: PTSD (post-traumatic stress disorder) - Plan: lamoTRIgine  (LAMICTAL ) 150 MG tablet  GAD (generalized anxiety disorder) - Plan: lamoTRIgine  (LAMICTAL ) 150 MG tablet  Patient doing  better on Lamictal .  Reviewed blood work results.  Last hemoglobin A1c 5.7.  He is taking trazodone  after temazepam did not help as much and making him tired.  His trazodone  is given by Dr. Lindia.  Will continue Lamictal  150 mg daily which is helping his anxiety and mood symptoms.  Discussed medication side effects and benefits.  Recommend to call back if is any question or any concern.  Patient not interested in EMDR since symptoms are much better.  Follow-up in 6 months.   Follow Up Instructions:     I discussed the assessment and treatment plan with the patient. The patient was provided an opportunity to ask questions and all were answered. The patient agreed with the plan and demonstrated an understanding of the instructions.   The patient was advised to call back or seek an in-person evaluation if the symptoms worsen or if the condition fails to improve as anticipated.    Collaboration of Care: Other provider involved in patient's care AEB notes are available in epic to review  Patient/Guardian was advised Release of Information must be obtained prior to any record release in order to collaborate their care with an outside provider. Patient/Guardian was advised if they have not already done so to contact the registration department to sign all necessary forms in order for us  to release information regarding their care.   Consent: Patient/Guardian gives verbal consent for treatment and assignment of benefits for services provided during this visit. Patient/Guardian expressed understanding and agreed to proceed.     Total encounter time 21 minutes which includes face-to-face time, chart reviewed, care coordination, order entry and documentation during this encounter.   Note: This document was prepared by [employer] voice dictation technology and any errors that results from this process are unintentional.    Leni ONEIDA Client, MD 12/07/2023

## 2024-01-27 NOTE — Progress Notes (Unsigned)
 Subjective:  Chief complaint: follow-up for HIV disease on medications'  Patient ID: Alexis Welch, male    DOB: Mar 21, 1960, 63 y.o.   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  Family History  Problem Relation Age of Onset   Cancer Father        colon cancer   Alcohol abuse Father    Dementia Father    Heart disease Maternal Uncle    Alcohol abuse Maternal Uncle    Depression Mother    Physical abuse Mother    Bipolar disorder Sister    Schizophrenia Sister    OCD Sister    Alcohol abuse Brother    Drug abuse Brother    Seizures Brother    Alcohol abuse Maternal Aunt    Depression Maternal Aunt    Alcohol abuse Paternal Uncle    Depression Paternal Uncle       Social History   Socioeconomic History   Marital status: Single    Spouse name: Not on file   Number of children: Not on file   Years of education: Not on file   Highest education level: Not on file  Occupational History   Not on file  Tobacco Use   Smoking status: Never   Smokeless tobacco: Never  Vaping Use   Vaping status: Never Used  Substance and Sexual Activity   Alcohol use: No    Alcohol/week: 0.0 standard drinks of alcohol   Drug use: Yes    Frequency: 3.0 times per week    Types: Marijuana   Sexual activity: Not Currently    Partners: Male    Birth control/protection: Condom    Comment: declined condoms  Other Topics Concern   Not on  file  Social History Narrative   Not on file   Social Drivers of Health   Tobacco Use: Low Risk (12/07/2023)   Patient History    Smoking Tobacco Use: Never    Smokeless Tobacco Use: Never    Passive Exposure: Not on file  Financial Resource Strain: Not on file  Food Insecurity: Not on file  Transportation Needs: Not on file  Physical Activity: Not on file  Stress: Not on file  Social Connections: Not on file  Depression (PHQ2-9): Low Risk (04/11/2023)   Depression (PHQ2-9)    PHQ-2 Score: 0  Alcohol Screen: Not on file  Housing: Not on file  Utilities: Not on file  Health Literacy: Not on file    Allergies[1]  Current Medications[2]'  Review of Systems     Objective:   Physical Exam        Assessment & Plan:       [1] No Known Allergies [2]  Current Outpatient Medications:    amLODipine  (NORVASC ) 10 MG tablet, Take 1 tablet (10 mg total) by mouth daily., Disp: 30 tablet, Rfl: 1   atorvastatin  (LIPITOR) 20 MG tablet, Take 1 tablet (20  mg total) by mouth daily., Disp: 90 tablet, Rfl: 3   bictegravir-emtricitabine -tenofovir  AF (BIKTARVY ) 50-200-25 MG TABS tablet, Take 1 tablet by mouth daily., Disp: 30 tablet, Rfl: 11   lamoTRIgine  (LAMICTAL ) 150 MG tablet, Take 1 tablet (150 mg total) by mouth daily., Disp: 90 tablet, Rfl: 1   metoprolol tartrate (LOPRESSOR) 25 MG tablet, Take 12.5 mg by mouth 2 (two) times daily., Disp: , Rfl:    traZODone  (DESYREL ) 50 MG tablet, Take 1 tablet (50 mg total) by mouth at bedtime., Disp: 30 tablet, Rfl: 11

## 2024-01-28 ENCOUNTER — Other Ambulatory Visit: Payer: Self-pay

## 2024-01-28 ENCOUNTER — Encounter: Payer: Self-pay | Admitting: Infectious Disease

## 2024-01-28 ENCOUNTER — Ambulatory Visit: Payer: Medicare Other | Admitting: Infectious Disease

## 2024-01-28 VITALS — BP 159/74 | HR 61 | Temp 97.8°F | Ht 68.0 in | Wt 150.0 lb

## 2024-01-28 DIAGNOSIS — F4310 Post-traumatic stress disorder, unspecified: Secondary | ICD-10-CM | POA: Diagnosis not present

## 2024-01-28 DIAGNOSIS — F3130 Bipolar disorder, current episode depressed, mild or moderate severity, unspecified: Secondary | ICD-10-CM

## 2024-01-28 DIAGNOSIS — Z23 Encounter for immunization: Secondary | ICD-10-CM | POA: Diagnosis not present

## 2024-01-28 DIAGNOSIS — Z21 Asymptomatic human immunodeficiency virus [HIV] infection status: Secondary | ICD-10-CM

## 2024-01-28 DIAGNOSIS — Z7185 Encounter for immunization safety counseling: Secondary | ICD-10-CM | POA: Diagnosis not present

## 2024-01-28 DIAGNOSIS — F331 Major depressive disorder, recurrent, moderate: Secondary | ICD-10-CM

## 2024-01-28 DIAGNOSIS — Z79899 Other long term (current) drug therapy: Secondary | ICD-10-CM

## 2024-01-28 DIAGNOSIS — E785 Hyperlipidemia, unspecified: Secondary | ICD-10-CM | POA: Diagnosis not present

## 2024-01-28 DIAGNOSIS — Z1212 Encounter for screening for malignant neoplasm of rectum: Secondary | ICD-10-CM | POA: Insufficient documentation

## 2024-01-28 DIAGNOSIS — I2510 Atherosclerotic heart disease of native coronary artery without angina pectoris: Secondary | ICD-10-CM

## 2024-01-28 DIAGNOSIS — B20 Human immunodeficiency virus [HIV] disease: Secondary | ICD-10-CM

## 2024-01-28 DIAGNOSIS — I10 Essential (primary) hypertension: Secondary | ICD-10-CM | POA: Diagnosis not present

## 2024-01-28 DIAGNOSIS — F419 Anxiety disorder, unspecified: Secondary | ICD-10-CM

## 2024-01-28 MED ORDER — BIKTARVY 50-200-25 MG PO TABS: 1.0000 | ORAL_TABLET | Freq: Every day | ORAL | 11 refills | Status: AC

## 2024-01-29 LAB — T-HELPER CELLS (CD4) COUNT (NOT AT ARMC)
CD4 % Helper T Cell: 36 % (ref 33–65)
CD4 T Cell Abs: 757 /uL (ref 400–1790)

## 2024-01-30 LAB — SYPHILIS: RPR W/REFLEX TO RPR TITER AND TREPONEMAL ANTIBODIES, TRADITIONAL SCREENING AND DIAGNOSIS ALGORITHM: RPR Ser Ql: NONREACTIVE

## 2024-01-30 LAB — HIV-1 RNA QUANT-NO REFLEX-BLD
HIV 1 RNA Quant: NOT DETECTED {copies}/mL
HIV-1 RNA Quant, Log: NOT DETECTED {Log_copies}/mL

## 2024-01-30 LAB — CBC WITH DIFFERENTIAL/PLATELET
Absolute Lymphocytes: 1985 {cells}/uL (ref 850–3900)
Absolute Monocytes: 485 {cells}/uL (ref 200–950)
Basophils Absolute: 32 {cells}/uL (ref 0–200)
Basophils Relative: 0.5 %
Eosinophils Absolute: 88 {cells}/uL (ref 15–500)
Eosinophils Relative: 1.4 %
HCT: 44.4 % (ref 39.4–51.1)
Hemoglobin: 15.1 g/dL (ref 13.2–17.1)
MCH: 31 pg (ref 27.0–33.0)
MCHC: 34 g/dL (ref 31.6–35.4)
MCV: 91.2 fL (ref 81.4–101.7)
MPV: 11.3 fL (ref 7.5–12.5)
Monocytes Relative: 7.7 %
Neutro Abs: 3711 {cells}/uL (ref 1500–7800)
Neutrophils Relative %: 58.9 %
Platelets: 190 Thousand/uL (ref 140–400)
RBC: 4.87 Million/uL (ref 4.20–5.80)
RDW: 13.2 % (ref 11.0–15.0)
Total Lymphocyte: 31.5 %
WBC: 6.3 Thousand/uL (ref 3.8–10.8)

## 2024-01-30 LAB — COMPLETE METABOLIC PANEL WITHOUT GFR
AG Ratio: 1.9 (calc) (ref 1.0–2.5)
ALT: 18 U/L (ref 9–46)
AST: 18 U/L (ref 10–35)
Albumin: 4.8 g/dL (ref 3.6–5.1)
Alkaline phosphatase (APISO): 93 U/L (ref 35–144)
BUN: 16 mg/dL (ref 7–25)
CO2: 30 mmol/L (ref 20–32)
Calcium: 9.8 mg/dL (ref 8.6–10.3)
Chloride: 105 mmol/L (ref 98–110)
Creat: 1.24 mg/dL (ref 0.70–1.35)
Globulin: 2.5 g/dL (ref 1.9–3.7)
Glucose, Bld: 76 mg/dL (ref 65–99)
Potassium: 4.1 mmol/L (ref 3.5–5.3)
Sodium: 144 mmol/L (ref 135–146)
Total Bilirubin: 1 mg/dL (ref 0.2–1.2)
Total Protein: 7.3 g/dL (ref 6.1–8.1)

## 2024-01-30 LAB — LIPID PANEL
Cholesterol: 138 mg/dL (ref <200)
HDL: 66 mg/dL (ref >=40)
LDL Cholesterol (Calc): 53 mg/dL
Non-HDL Cholesterol (Calc): 72 mg/dL (ref <130)
Total CHOL/HDL Ratio: 2.1 (calc) (ref <5.0)
Triglycerides: 103 mg/dL (ref <150)

## 2024-06-06 ENCOUNTER — Telehealth (HOSPITAL_COMMUNITY): Admitting: Psychiatry

## 2024-07-28 ENCOUNTER — Ambulatory Visit: Payer: Self-pay | Admitting: Infectious Disease
